# Patient Record
Sex: Male | Born: 1970
Health system: Southern US, Community
[De-identification: ages and names within clinical notes are randomized; demographics above are authoritative.]

## PROBLEM LIST (undated history)

## (undated) DIAGNOSIS — I639 Cerebral infarction, unspecified: Secondary | ICD-10-CM

## (undated) DIAGNOSIS — I10 Essential (primary) hypertension: Secondary | ICD-10-CM

## (undated) DIAGNOSIS — H269 Unspecified cataract: Secondary | ICD-10-CM

## (undated) DIAGNOSIS — I509 Heart failure, unspecified: Secondary | ICD-10-CM

## (undated) HISTORY — PX: CATARACT EXTRACTION: SUR2

## (undated) HISTORY — DX: Cerebral infarction, unspecified: I63.9

## (undated) HISTORY — DX: Unspecified cataract: H26.9

## (undated) HISTORY — PX: SKIN GRAFT: SHX250

---

## 2000-03-29 ENCOUNTER — Emergency Department (HOSPITAL_COMMUNITY): Admission: EM | Admit: 2000-03-29 | Discharge: 2000-03-29 | Payer: Self-pay | Admitting: Emergency Medicine

## 2004-10-22 ENCOUNTER — Emergency Department (HOSPITAL_COMMUNITY): Admission: EM | Admit: 2004-10-22 | Discharge: 2004-10-22 | Payer: Self-pay | Admitting: Emergency Medicine

## 2006-12-08 ENCOUNTER — Emergency Department (HOSPITAL_COMMUNITY): Admission: EM | Admit: 2006-12-08 | Discharge: 2006-12-08 | Payer: Self-pay | Admitting: *Deleted

## 2011-01-04 ENCOUNTER — Institutional Professional Consult (permissible substitution): Payer: Self-pay | Admitting: Cardiovascular Disease

## 2011-07-28 ENCOUNTER — Ambulatory Visit (INDEPENDENT_AMBULATORY_CARE_PROVIDER_SITE_OTHER): Payer: BC Managed Care – PPO | Admitting: Family Medicine

## 2011-07-28 VITALS — BP 142/86 | HR 85 | Temp 98.9°F | Resp 16 | Ht 74.5 in | Wt 274.0 lb

## 2011-07-28 DIAGNOSIS — I10 Essential (primary) hypertension: Secondary | ICD-10-CM

## 2011-07-28 LAB — BASIC METABOLIC PANEL
BUN: 11 mg/dL (ref 6–23)
Chloride: 97 mEq/L (ref 96–112)
Potassium: 4.6 mEq/L (ref 3.5–5.3)

## 2011-07-28 MED ORDER — LISINOPRIL 10 MG PO TABS: 10.0000 mg | ORAL_TABLET | Freq: Every day | ORAL | Status: DC

## 2011-07-28 NOTE — Progress Notes (Signed)
  Subjective:    Patient ID: Steven Cochran, male    DOB: 1970-12-30, 41 y.o.   MRN: [id number]  HPI 41 yo male with concern for high blood pressure.  Used to take lisinopril 10mg  daily.  Ran out about 2 months ago.  Insurance ran out.  Diagnosed with high blood pressure about 3 years ago.  Has been exercising and trying to lose weight, has lost about 35# over last year.    No other complaints.     Review of Systems Negative except as per HPI     Objective:   Physical Exam  Constitutional: He appears well-developed and well-nourished.  Cardiovascular: Normal rate, regular rhythm, normal heart sounds and intact distal pulses.   No murmur heard. Pulmonary/Chest: Effort normal and breath sounds normal.  Neurological: He is alert.  Skin: Skin is warm and dry.          Assessment & Plan:  HTN - restart lisinopril 10.  Check BMET

## 2011-07-29 MED ORDER — METFORMIN HCL 500 MG PO TABS: 500.0000 mg | ORAL_TABLET | Freq: Two times a day (BID) | ORAL | Status: DC

## 2011-07-29 NOTE — Progress Notes (Signed)
  Subjective:    Patient ID: Steven Cochran, male    DOB: 1970/05/23, 41 y.o.   MRN: [id number]  HPI    Review of Systems     Objective:   Physical Exam        Assessment & Plan:  Patient called about high sugar.  Is in [place] until early April.  Metformin sent to local [facility] there for patient to begin taking.  Advised to return back in as soon as he is back in town.

## 2011-07-29 NOTE — Progress Notes (Signed)
Addended by: Lamar Laundry on: 07/29/2011 01:44 PM   Modules accepted: Orders

## 2011-12-09 ENCOUNTER — Emergency Department (HOSPITAL_BASED_OUTPATIENT_CLINIC_OR_DEPARTMENT_OTHER)
Admission: EM | Admit: 2011-12-09 | Discharge: 2011-12-09 | Disposition: A | Discharge status: Home / Self Care | Payer: BC Managed Care – PPO | Attending: Emergency Medicine | Admitting: Emergency Medicine

## 2011-12-09 ENCOUNTER — Encounter (HOSPITAL_BASED_OUTPATIENT_CLINIC_OR_DEPARTMENT_OTHER): Payer: Self-pay | Admitting: Emergency Medicine

## 2011-12-09 DIAGNOSIS — I10 Essential (primary) hypertension: Secondary | ICD-10-CM

## 2011-12-09 DIAGNOSIS — E119 Type 2 diabetes mellitus without complications: Secondary | ICD-10-CM | POA: Insufficient documentation

## 2011-12-09 HISTORY — DX: Essential (primary) hypertension: I10

## 2011-12-09 MED ORDER — METOPROLOL TARTRATE 50 MG PO TABS: 25.0000 mg | ORAL_TABLET | Freq: Two times a day (BID) | ORAL | Status: DC

## 2011-12-09 NOTE — ED Notes (Addendum)
Pt states his BP has been getting higher in the last week.  Has been on BP medication for three months.  Pt denies any symptoms, no sob or chest pain.

## 2011-12-09 NOTE — ED Notes (Signed)
Pt brought in his own automatic BP machine which read 167/113. When checked manually it was 138/100.

## 2011-12-09 NOTE — ED Provider Notes (Signed)
History     CSN: [id number]  Arrival date & time 12/09/11  1221   First MD Initiated Contact with Patient 12/09/11 1244      Chief Complaint  Patient presents with  . Hypertension    (Consider location/radiation/quality/duration/timing/severity/associated sxs/prior treatment) HPI Comments: Has been checking his bp at home with his machine and is getting high readings.  No symptoms otherwise.  Patient is a 41 y.o. male presenting with hypertension. The history is provided by the patient.  Hypertension This is a chronic problem. The current episode started 2 days ago. The problem occurs constantly. The problem has been gradually worsening. Pertinent negatives include no chest pain, no abdominal pain and no shortness of breath. Nothing aggravates the symptoms. Nothing relieves the symptoms. He has tried nothing for the symptoms. The treatment provided no relief.    Past Medical History  Diagnosis Date  . Hypertension   . Diabetes mellitus     No past surgical history on file.  No family history on file.  History  Substance Use Topics  . Smoking status: Never Smoker   . Smokeless tobacco: Not on file  . Alcohol Use: No      Review of Systems  Respiratory: Negative for shortness of breath.   Cardiovascular: Negative for chest pain.  Gastrointestinal: Negative for abdominal pain.  All other systems reviewed and are negative.    Allergies  Review of patient's allergies indicates no known allergies.  Home Medications   Current Outpatient Rx  Name Route Sig Dispense Refill  . LISINOPRIL 10 MG PO TABS Oral Take 25 mg by mouth daily.    Marland Kitchen METFORMIN HCL 500 MG PO TABS Oral Take 1 tablet (500 mg total) by mouth 2 (two) times daily with a meal. 60 tablet 1    BP 134/96  Pulse 94  Temp 98.7 F (37.1 C) (Oral)  Resp 16  Ht 6\' 2"  (1.88 m)  Wt 287 lb (130.182 kg)  BMI 36.85 kg/m2  SpO2 100%  Physical Exam  Nursing note and vitals reviewed. Constitutional: He is  oriented to person, place, and time. He appears well-developed and well-nourished. No distress.  HENT:  Head: Normocephalic and atraumatic.  Neck: Normal range of motion. Neck supple.  Cardiovascular: Normal rate and regular rhythm.   No murmur heard. Pulmonary/Chest: Effort normal and breath sounds normal. No respiratory distress.  Abdominal: Soft. Bowel sounds are normal. He exhibits no distension.  Musculoskeletal: Normal range of motion. He exhibits no edema.  Neurological: He is alert and oriented to person, place, and time.  Skin: Skin is warm and dry. He is not diaphoretic.    ED Course  Procedures (including critical care time)  Labs Reviewed - No data to display No results found.   No diagnosis found.    MDM  The patient presents with elevated bp at home.  Readings here with our machine and his are consistent.  I will prescribe metoprolol if his bp's continue to run high.          Geoffery Lyons, MD 12/09/11 1336

## 2012-05-30 ENCOUNTER — Institutional Professional Consult (permissible substitution): Payer: Self-pay | Admitting: Cardiology

## 2012-06-05 ENCOUNTER — Encounter: Payer: Self-pay | Admitting: Cardiology

## 2012-06-17 ENCOUNTER — Institutional Professional Consult (permissible substitution): Payer: Self-pay | Admitting: Cardiology

## 2012-06-30 ENCOUNTER — Encounter: Payer: Self-pay | Admitting: Cardiovascular Disease

## 2012-06-30 ENCOUNTER — Ambulatory Visit (INDEPENDENT_AMBULATORY_CARE_PROVIDER_SITE_OTHER): Payer: BC Managed Care – PPO | Admitting: Cardiovascular Disease

## 2012-06-30 VITALS — BP 169/107 | HR 90 | Ht 74.5 in | Wt 283.1 lb

## 2012-06-30 DIAGNOSIS — I10 Essential (primary) hypertension: Secondary | ICD-10-CM | POA: Insufficient documentation

## 2012-06-30 DIAGNOSIS — R0989 Other specified symptoms and signs involving the circulatory and respiratory systems: Secondary | ICD-10-CM

## 2012-06-30 DIAGNOSIS — R0600 Dyspnea, unspecified: Secondary | ICD-10-CM

## 2012-06-30 MED ORDER — LISINOPRIL 10 MG PO TABS: 25.0000 mg | ORAL_TABLET | Freq: Every day | ORAL | Status: DC

## 2012-06-30 MED ORDER — HYDROCHLOROTHIAZIDE 25 MG PO TABS: 25.0000 mg | ORAL_TABLET | Freq: Every day | ORAL | Status: DC

## 2012-06-30 MED ORDER — METOPROLOL TARTRATE 50 MG PO TABS: 25.0000 mg | ORAL_TABLET | Freq: Two times a day (BID) | ORAL | Status: DC

## 2012-06-30 MED ORDER — POTASSIUM CHLORIDE CRYS ER 10 MEQ PO TBCR: 10.0000 meq | EXTENDED_RELEASE_TABLET | Freq: Every day | ORAL | Status: DC

## 2012-06-30 MED ORDER — CARVEDILOL 6.25 MG PO TABS: 6.2500 mg | ORAL_TABLET | Freq: Two times a day (BID) | ORAL | Status: DC

## 2012-06-30 NOTE — Assessment & Plan Note (Addendum)
Domonique  presents today for further evaluation of his hypertension and cardiac enlargement. He had a chest x-ray while working up in [place] about a year ago and was told he had an enlarged heart.  He's been concerned but has not been able to get his medications refilled because of lack of insurance. Now has insurance and wants to take better care of himself.  He ran out of his medications about a month ago.  I don't think that his current medication list is as good as we might be able to achieve. We'll start him on hydrochlorothiazide 25 mg a day as well as potassium chloride 10 mg a day. We'll also start him on carvedilol 6.25 mg twice a day instead of metoprolol. We'll gradually titrate up the carvedilol as tolerated/as needed. He has some concerns about ED - we may be able to use Bystlic instead of Coreg if this is a problem for his as we get to the higher doses.  We'll get an echocardiogram to make sure that he has normal left jugular size and function.  We'll see him back in one month for followup visit.

## 2012-06-30 NOTE — Progress Notes (Signed)
     Steven Cochran Date of Birth  1970-12-17       [facility]    [place] [address]  [address], [address] [place], [place]  [address]   [place], [place]  [address] [phone]     [phone]   Fax  [phone]    Fax [phone]  Problem List: 1. HTN 2. Mild cardiomegaly 3. Diabetes Mellitus  History of Present Illness:  Steven Cochran is a 42 yo with hx of HTN.  He was up and [place] working approximately one year ago. He presented to the emergency room with a prolonged episode of shortness breath and cough. He was told that he had an enlarged heart.  He lives here [place]. He sees Dr. Viann Shove for primary care. He has not taken his blood pressure medicines in about a month.  He has not had any chest pain or dyspnea.    He has a family hx of HTN ( mother) .  His mother also died of an aneurism.  Current Outpatient Prescriptions on File Prior to Visit  Medication Sig Dispense Refill  . lisinopril (PRINIVIL,ZESTRIL) 10 MG tablet Take 25 mg by mouth daily.      . metFORMIN (GLUCOPHAGE) 500 MG tablet Take 1 tablet (500 mg total) by mouth 2 (two) times daily with a meal.  60 tablet  1  . metoprolol (LOPRESSOR) 50 MG tablet Take 0.5 tablets (25 mg total) by mouth 2 (two) times daily.  60 tablet  1   No current facility-administered medications on file prior to visit.    No Known Allergies  Past Medical History  Diagnosis Date  . Hypertension   . Diabetes mellitus     No past surgical history on file.  History  Smoking status  . Never Smoker   Smokeless tobacco  . Not on file    History  Alcohol Use No    No family history on file.  Reviw of Systems:  Reviewed in the HPI.  All other systems are negative.  Physical Exam: Blood pressure 169/107, pulse 90, height 6' 2.5" (1.892 m), weight 283 lb 1.9 oz (128.422 kg). General: Well developed, well nourished, in no acute distress.  Head: Normocephalic, atraumatic, sclera  non-icteric, mucus membranes are moist,   Neck: Supple. Carotids are 2 + without bruits. No JVD   Lungs: Clear   Heart: RR, normal S1, S2.  Abdomen: Soft, non-tender, non-distended with normal bowel sounds.  Msk:  Strength and tone are normal   Extremities: No clubbing or cyanosis. No edema.  Distal pedal pulses are 2+ and equal    Neuro: CN II - XII intact.  Alert and oriented X 3.   Psych:  Normal   ECG: Feb. 24, 2014:  NSR at 86. NS ST/T abnormality   Assessment / Plan:

## 2012-06-30 NOTE — Patient Instructions (Addendum)
Your physician has requested that you have an echocardiogram. Echocardiography is a painless test that uses sound waves to create images of your heart. It provides your doctor with information about the size and shape of your heart and how well your heart's chambers and valves are working. This procedure takes approximately one hour. There are no restrictions for this procedure.  Your physician recommends that you schedule a follow-up appointment in: 1 MONTH WITH DR. Elease Hashimoto OR Norma Fredrickson, NP  Your physician has recommended you make the following change in your medication:   START HCTZ 25 MG MORNING START K-DUR 10 MG MORNING START COREG 6.25 TWICE A DAY (TWELVE HOURS APART)  REDUCE HIGH SODIUM FOODS LIKE CANNED SOUP, GRAVY, SAUCES, READY PREPARED FOODS LIKE FROZEN FOODS; LEAN CUISINE, LASAGNA. BACON, SAUSAGE, LUNCH MEAT, FAST FOODS.Marland Kitchen  DASH Diet The DASH diet stands for "Dietary Approaches to Stop Hypertension." It is a healthy eating plan that has been shown to reduce high blood pressure (hypertension) in as little as 14 days, while also possibly providing other significant health benefits. These other health benefits include reducing the risk of breast cancer after menopause and reducing the risk of type 2 diabetes, heart disease, colon cancer, and stroke. Health benefits also include weight loss and slowing kidney failure in patients with chronic kidney disease.  DIET GUIDELINES  Limit salt (sodium). Your diet should contain less than 1500 mg of sodium daily.  Limit refined or processed carbohydrates. Your diet should include mostly whole grains. Desserts and added sugars should be used sparingly.  Include small amounts of heart-healthy fats. These types of fats include nuts, oils, and tub margarine. Limit saturated and trans fats. These fats have been shown to be harmful in the body. CHOOSING FOODS  The following food groups are based on a 2000 calorie diet. See your Registered Dietitian  for individual calorie needs. Grains and Grain Products (6 to 8 servings daily)  Eat More Often: Whole-wheat bread, brown rice, whole-grain or wheat pasta, quinoa, popcorn without added fat or salt (air popped).  Eat Less Often: White bread, white pasta, white rice, cornbread. Vegetables (4 to 5 servings daily)  Eat More Often: Fresh, frozen, and canned vegetables. Vegetables may be raw, steamed, roasted, or grilled with a minimal amount of fat.  Eat Less Often/Avoid: Creamed or fried vegetables. Vegetables in a cheese sauce. Fruit (4 to 5 servings daily)  Eat More Often: All fresh, canned (in natural juice), or frozen fruits. Dried fruits without added sugar. One hundred percent fruit juice ( cup [237 mL] daily).  Eat Less Often: Dried fruits with added sugar. Canned fruit in light or heavy syrup. [employer], Fish, and Poultry (2 servings or less daily. One serving is 3 to 4 oz [85-114 g]).  Eat More Often: Ninety percent or leaner ground beef, tenderloin, sirloin. Round cuts of beef, chicken breast, [place] breast. All fish. Grill, bake, or broil your meat. Nothing should be fried.  Eat Less Often/Avoid: Fatty cuts of meat, [place], or chicken leg, thigh, or wing. Fried cuts of meat or fish. Dairy (2 to 3 servings)  Eat More Often: Low-fat or fat-free milk, low-fat plain or light yogurt, reduced-fat or part-skim cheese.  Eat Less Often/Avoid: Milk (whole, 2%).Whole milk yogurt. Full-fat cheeses. Nuts, Seeds, and Legumes (4 to 5 servings per week)  Eat More Often: All without added salt.  Eat Less Often/Avoid: Salted nuts and seeds, canned beans with added salt. Fats and Sweets (limited)  Eat More Often: Vegetable oils,  tub margarines without trans fats, sugar-free gelatin. Mayonnaise and salad dressings.  Eat Less Often/Avoid: Coconut oils, palm oils, butter, stick margarine, cream, half and half, cookies, candy, pie. FOR MORE INFORMATION The Dash Diet Eating Plan:  www.dashdiet.org Document Released: 04/12/2011 Document Revised: 07/16/2011 Document Reviewed: 04/12/2011 [facility] Patient Information 2013 [place], [place].

## 2012-07-01 ENCOUNTER — Other Ambulatory Visit: Payer: Self-pay

## 2012-07-11 ENCOUNTER — Other Ambulatory Visit (HOSPITAL_COMMUNITY): Payer: BC Managed Care – PPO

## 2012-07-15 ENCOUNTER — Other Ambulatory Visit (HOSPITAL_COMMUNITY): Payer: BC Managed Care – PPO

## 2012-07-15 ENCOUNTER — Ambulatory Visit (HOSPITAL_COMMUNITY): Payer: BC Managed Care – PPO | Attending: Cardiology

## 2012-07-15 DIAGNOSIS — R0989 Other specified symptoms and signs involving the circulatory and respiratory systems: Secondary | ICD-10-CM | POA: Insufficient documentation

## 2012-07-15 DIAGNOSIS — R0600 Dyspnea, unspecified: Secondary | ICD-10-CM

## 2012-07-15 DIAGNOSIS — I10 Essential (primary) hypertension: Secondary | ICD-10-CM | POA: Insufficient documentation

## 2012-07-15 DIAGNOSIS — R0609 Other forms of dyspnea: Secondary | ICD-10-CM | POA: Insufficient documentation

## 2012-07-15 DIAGNOSIS — I517 Cardiomegaly: Secondary | ICD-10-CM

## 2012-07-15 NOTE — Progress Notes (Signed)
Echocardiogram performed.  

## 2012-07-16 ENCOUNTER — Telehealth: Payer: Self-pay | Admitting: Cardiovascular Disease

## 2012-07-16 NOTE — Telephone Encounter (Signed)
Patient called echo results given.Advised to keep appointment with Norma Fredrickson NP 08/01/12.

## 2012-07-16 NOTE — Telephone Encounter (Signed)
New problem:  Test results.  

## 2012-08-01 ENCOUNTER — Ambulatory Visit: Payer: BC Managed Care – PPO | Admitting: Nurse Practitioner

## 2012-09-01 ENCOUNTER — Ambulatory Visit: Payer: BC Managed Care – PPO | Admitting: Nurse Practitioner

## 2012-09-15 ENCOUNTER — Ambulatory Visit: Payer: BC Managed Care – PPO | Admitting: Nurse Practitioner

## 2012-10-21 ENCOUNTER — Encounter: Payer: Self-pay | Admitting: Nurse Practitioner

## 2013-06-01 ENCOUNTER — Encounter (HOSPITAL_COMMUNITY): Payer: Self-pay | Admitting: Emergency Medicine

## 2013-06-01 ENCOUNTER — Emergency Department (HOSPITAL_COMMUNITY)
Admission: EM | Admit: 2013-06-01 | Discharge: 2013-06-01 | Disposition: A | Discharge status: Home / Self Care | Payer: BC Managed Care – PPO | Attending: Emergency Medicine | Admitting: Emergency Medicine

## 2013-06-01 DIAGNOSIS — Z760 Encounter for issue of repeat prescription: Secondary | ICD-10-CM

## 2013-06-01 DIAGNOSIS — Z79899 Other long term (current) drug therapy: Secondary | ICD-10-CM | POA: Insufficient documentation

## 2013-06-01 DIAGNOSIS — I10 Essential (primary) hypertension: Secondary | ICD-10-CM | POA: Insufficient documentation

## 2013-06-01 DIAGNOSIS — R739 Hyperglycemia, unspecified: Secondary | ICD-10-CM

## 2013-06-01 DIAGNOSIS — E119 Type 2 diabetes mellitus without complications: Secondary | ICD-10-CM | POA: Insufficient documentation

## 2013-06-01 LAB — URINALYSIS, ROUTINE W REFLEX MICROSCOPIC
Bilirubin Urine: NEGATIVE
Hgb urine dipstick: NEGATIVE
KETONES UR: NEGATIVE mg/dL
LEUKOCYTES UA: NEGATIVE
Nitrite: NEGATIVE
PH: 5 (ref 5.0–8.0)
Protein, ur: NEGATIVE mg/dL
SPECIFIC GRAVITY, URINE: 1.04 — AB (ref 1.005–1.030)
Urobilinogen, UA: 0.2 mg/dL (ref 0.0–1.0)

## 2013-06-01 LAB — COMPREHENSIVE METABOLIC PANEL
ALBUMIN: 4 g/dL (ref 3.5–5.2)
ALK PHOS: 83 U/L (ref 39–117)
ALT: 22 U/L (ref 0–53)
AST: 26 U/L (ref 0–37)
BUN: 15 mg/dL (ref 6–23)
CO2: 25 mEq/L (ref 19–32)
Calcium: 9.6 mg/dL (ref 8.4–10.5)
Chloride: 95 mEq/L — ABNORMAL LOW (ref 96–112)
Creatinine, Ser: 0.79 mg/dL (ref 0.50–1.35)
GFR calc Af Amer: >90 mL/min (ref >90)
GFR calc non Af Amer: >90 mL/min (ref >90)
Glucose, Bld: 405 mg/dL — ABNORMAL HIGH (ref 70–99)
POTASSIUM: 4.5 meq/L (ref 3.7–5.3)
Sodium: 134 mEq/L — ABNORMAL LOW (ref 137–147)
TOTAL PROTEIN: 7.7 g/dL (ref 6.0–8.3)
Total Bilirubin: <0.2 mg/dL — ABNORMAL LOW (ref 0.3–1.2)

## 2013-06-01 LAB — GLUCOSE, CAPILLARY
GLUCOSE-CAPILLARY: 388 mg/dL — AB (ref 70–99)
GLUCOSE-CAPILLARY: 344 mg/dL — AB (ref 70–99)
Glucose-Capillary: 287 mg/dL — ABNORMAL HIGH (ref 70–99)

## 2013-06-01 LAB — CBC
HEMATOCRIT: 38.1 % — AB (ref 39.0–52.0)
Hemoglobin: 12.7 g/dL — ABNORMAL LOW (ref 13.0–17.0)
MCH: 23.1 pg — ABNORMAL LOW (ref 26.0–34.0)
MCHC: 33.3 g/dL (ref 30.0–36.0)
MCV: 69.3 fL — ABNORMAL LOW (ref 78.0–100.0)
Platelets: 280 10*3/uL (ref 150–400)
RBC: 5.5 MIL/uL (ref 4.22–5.81)
RDW: 13.9 % (ref 11.5–15.5)
WBC: 7.4 10*3/uL (ref 4.0–10.5)

## 2013-06-01 MED ORDER — INSULIN ASPART 100 UNIT/ML ~~LOC~~ SOLN
10.0000 [IU] | Freq: Once | SUBCUTANEOUS | Status: AC
  Administered 2013-06-01: 10 [IU] via SUBCUTANEOUS
  Filled 2013-06-01: qty 1

## 2013-06-01 MED ORDER — SITAGLIPTIN-METFORMIN HCL 50-1000 MG PO TABS
1.0000 | ORAL_TABLET | Freq: Every day | ORAL | Status: DC
Start: 2013-06-01 — End: 2014-10-22

## 2013-06-01 MED ORDER — SODIUM CHLORIDE 0.9 % IV SOLN
Freq: Once | INTRAVENOUS | Status: AC
  Administered 2013-06-01: 05:00:00 via INTRAVENOUS

## 2013-06-01 MED ORDER — SODIUM CHLORIDE 0.9 % IV SOLN
Freq: Once | INTRAVENOUS | Status: AC
  Administered 2013-06-01: 03:00:00 via INTRAVENOUS

## 2013-06-01 NOTE — ED Provider Notes (Addendum)
CSN: [id number]     Arrival date & time 06/01/13  0127 History   First MD Initiated Contact with Patient 06/01/13 0500     Chief Complaint  Patient presents with  . Hyperglycemia   (Consider location/radiation/quality/duration/timing/severity/associated sxs/prior Treatment) HPI This is a 43 year old male with a history of type 2 diabetes who usually takes Janumet. He was out of town working for the past 4 months and ran out of his Janumet about 2 months ago. He had not been symptomatic recently. Specifically he has not had polyuria or polydipsia he checked his sugar yesterday evening and found to be 592. On arrival here he was found to be 388. He was given 2 L of normal saline IV and 10 units of subcutaneous insulin by my verbal order prior to my evaluation. His sugar has come down somewhat. He states when he is on his Janumet his sugar is usually well-controlled under 200. He continues to have no acute complaint.  Past Medical History  Diagnosis Date  . Hypertension   . Diabetes mellitus    History reviewed. No pertinent past surgical history. No family history on file. History  Substance Use Topics  . Smoking status: Never Smoker   . Smokeless tobacco: Not on file  . Alcohol Use: No    Review of Systems  All other systems reviewed and are negative.    Allergies  Review of patient's allergies indicates no known allergies.  Home Medications   Current Outpatient Rx  Name  Route  Sig  Dispense  Refill  . carvedilol (COREG) 6.25 MG tablet   Oral   Take 1 tablet (6.25 mg total) by mouth 2 (two) times daily.   60 tablet   5   . hydrochlorothiazide (HYDRODIURIL) 25 MG tablet   Oral   Take 1 tablet (25 mg total) by mouth daily.   30 tablet   5   . lisinopril (PRINIVIL,ZESTRIL) 10 MG tablet   Oral   Take 2.5 tablets (25 mg total) by mouth daily.   75 tablet   5   . metoprolol (LOPRESSOR) 50 MG tablet   Oral   Take 0.5 tablets (25 mg total) by mouth 2 (two) times  daily.   30 tablet   6   . naproxen sodium (ANAPROX) 220 MG tablet   Oral   Take 440 mg by mouth daily as needed (for pain).         Marland Kitchen OVER THE COUNTER MEDICATION      Pt gets a multivitamin pack from [facility] for diabetics (Diabetes multipak)         . potassium chloride (K-DUR,KLOR-CON) 10 MEQ tablet   Oral   Take 1 tablet (10 mEq total) by mouth daily.   30 tablet   5    BP 185/122  Pulse 102  Temp(Src) 98 F (36.7 C) (Oral)  Resp 16  Ht 6' 2.5" (1.892 m)  Wt 285 lb 7 oz (129.474 kg)  BMI 36.17 kg/m2  SpO2 96%  Physical Exam General: Well-developed, well-nourished male in no acute distress; appearance consistent with age of record HENT: normocephalic; atraumatic Eyes: pupils equal, round and reactive to light; extraocular muscles intact Neck: supple Heart: regular rate and rhythm; no murmurs, rubs or gallops Lungs: clear to auscultation bilaterally Abdomen: soft; nondistended; nontender; no masses or hepatosplenomegaly; bowel sounds present Extremities: No deformity; full range of motion; pulses normal Neurologic: Awake, alert and oriented; motor function intact in all extremities and symmetric; no facial droop Skin:  Warm and dry Psychiatric: Normal mood and affect    ED Course  Procedures (including critical care time)    MDM   Nursing notes and vitals signs, including pulse oximetry, reviewed.  Summary of this visit's results, reviewed by myself:  Labs:  Results for orders placed during the hospital encounter of 06/01/13 (from the past 24 hour(s))  CBC     Status: Abnormal   Collection Time    06/01/13  1:46 AM      Result Value Range   WBC 7.4  4.0 - 10.5 K/uL   RBC 5.50  4.22 - 5.81 MIL/uL   Hemoglobin 12.7 (*) 13.0 - 17.0 g/dL   HCT 45.4 (*) 09.8 - 11.9 %   MCV 69.3 (*) 78.0 - 100.0 fL   MCH 23.1 (*) 26.0 - 34.0 pg   MCHC 33.3  30.0 - 36.0 g/dL   RDW 14.7  82.9 - 56.2 %   Platelets 280  150 - 400 K/uL  COMPREHENSIVE METABOLIC PANEL      Status: Abnormal   Collection Time    06/01/13  1:46 AM      Result Value Range   Sodium 134 (*) 137 - 147 mEq/L   Potassium 4.5  3.7 - 5.3 mEq/L   Chloride 95 (*) 96 - 112 mEq/L   CO2 25  19 - 32 mEq/L   Glucose, Bld 405 (*) 70 - 99 mg/dL   BUN 15  6 - 23 mg/dL   Creatinine, Ser 1.30  0.50 - 1.35 mg/dL   Calcium 9.6  8.4 - 86.5 mg/dL   Total Protein 7.7  6.0 - 8.3 g/dL   Albumin 4.0  3.5 - 5.2 g/dL   AST 26  0 - 37 U/L   ALT 22  0 - 53 U/L   Alkaline Phosphatase 83  39 - 117 U/L   Total Bilirubin <0.2 (*) 0.3 - 1.2 mg/dL   GFR calc non Af Amer >90  >90 mL/min   GFR calc Af Amer >90  >90 mL/min  GLUCOSE, CAPILLARY     Status: Abnormal   Collection Time    06/01/13  1:48 AM      Result Value Range   Glucose-Capillary 388 (*) 70 - 99 mg/dL   Comment 1 Documented in Chart     Comment 2 Notify RN    URINALYSIS, ROUTINE W REFLEX MICROSCOPIC     Status: Abnormal   Collection Time    06/01/13  3:06 AM      Result Value Range   Color, Urine YELLOW  YELLOW   APPearance CLEAR  CLEAR   Specific Gravity, Urine 1.040 (*) 1.005 - 1.030   pH 5.0  5.0 - 8.0   Glucose, UA >1000 (*) NEGATIVE mg/dL   Hgb urine dipstick NEGATIVE  NEGATIVE   Bilirubin Urine NEGATIVE  NEGATIVE   Ketones, ur NEGATIVE  NEGATIVE mg/dL   Protein, ur NEGATIVE  NEGATIVE mg/dL   Urobilinogen, UA 0.2  0.0 - 1.0 mg/dL   Nitrite NEGATIVE  NEGATIVE   Leukocytes, UA NEGATIVE  NEGATIVE  GLUCOSE, CAPILLARY     Status: Abnormal   Collection Time    06/01/13  4:23 AM      Result Value Range   Glucose-Capillary 344 (*) 70 - 99 mg/dL   We will refill his Janumet and he will followup with his primary care physician. The patient is insistent that he was only on Janumet once daily despite its labeling recommending twice a  day.      Hanley SeamenJohn L Kennesha Brewbaker, MD 06/01/13 [id number]  Hanley SeamenJohn L Ninoska Goswick, MD 06/01/13 [phone]

## 2013-06-01 NOTE — ED Notes (Signed)
Pt states he has been out of state working for 4 months and has not had his glucose meter or diabetic medications.  States he returned yesterday and checked his blood sugar at home tonight and it was 592.  Denies any symptoms.  BP also elevated.  Pt states he has been taking BP medication but was really scared when he realized CBG was elevated so much tonight.

## 2013-06-01 NOTE — Discharge Instructions (Signed)

## 2014-09-21 ENCOUNTER — Ambulatory Visit: Payer: BLUE CROSS/BLUE SHIELD

## 2014-09-28 ENCOUNTER — Ambulatory Visit: Payer: BLUE CROSS/BLUE SHIELD

## 2014-10-05 ENCOUNTER — Ambulatory Visit: Payer: BLUE CROSS/BLUE SHIELD

## 2014-10-22 ENCOUNTER — Ambulatory Visit (INDEPENDENT_AMBULATORY_CARE_PROVIDER_SITE_OTHER): Payer: Self-pay | Admitting: Family Medicine

## 2014-10-22 VITALS — BP 158/102 | HR 107 | Temp 98.9°F | Resp 18 | Ht 73.5 in | Wt 266.0 lb

## 2014-10-22 DIAGNOSIS — Z0000 Encounter for general adult medical examination without abnormal findings: Secondary | ICD-10-CM

## 2014-10-22 DIAGNOSIS — Z021 Encounter for pre-employment examination: Secondary | ICD-10-CM

## 2014-10-22 NOTE — Progress Notes (Signed)
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 He is being treated for early diabetes and hypertension. He's having no symptoms at the present time. His last A1c was 5.8.   Objective: Please see form Physical exam is normal Patient brings in a ophthalmological evaluation showing he has except for vision of 20/30 in one eye and 20/40 in the other  Blood pressure recheck 138/88  Assessment: Passes for one year  This chart was scribed in my presence and reviewed by me personally.    ICD-9-CM ICD-10-CM   1. Physical exam, annual V70.0 Z00.00      Signed, Elvina Sidle, MD

## 2015-09-20 ENCOUNTER — Encounter (HOSPITAL_BASED_OUTPATIENT_CLINIC_OR_DEPARTMENT_OTHER): Payer: Self-pay

## 2015-09-20 ENCOUNTER — Emergency Department (HOSPITAL_BASED_OUTPATIENT_CLINIC_OR_DEPARTMENT_OTHER)
Admission: EM | Admit: 2015-09-20 | Discharge: 2015-09-20 | Disposition: A | Discharge status: Home / Self Care | Payer: BLUE CROSS/BLUE SHIELD | Attending: Emergency Medicine | Admitting: Emergency Medicine

## 2015-09-20 DIAGNOSIS — E1165 Type 2 diabetes mellitus with hyperglycemia: Secondary | ICD-10-CM | POA: Insufficient documentation

## 2015-09-20 DIAGNOSIS — R739 Hyperglycemia, unspecified: Secondary | ICD-10-CM

## 2015-09-20 DIAGNOSIS — I10 Essential (primary) hypertension: Secondary | ICD-10-CM | POA: Insufficient documentation

## 2015-09-20 LAB — CBC WITH DIFFERENTIAL/PLATELET
BASOS ABS: 0 10*3/uL (ref 0.0–0.1)
Basophils Relative: 0 %
EOS ABS: 0.1 10*3/uL (ref 0.0–0.7)
Eosinophils Relative: 1 %
HEMATOCRIT: 35.3 % — AB (ref 39.0–52.0)
HEMOGLOBIN: 11.3 g/dL — AB (ref 13.0–17.0)
LYMPHS PCT: 53 %
Lymphs Abs: 3.4 10*3/uL (ref 0.7–4.0)
MCH: 22.1 pg — ABNORMAL LOW (ref 26.0–34.0)
MCHC: 32 g/dL (ref 30.0–36.0)
MCV: 69.1 fL — ABNORMAL LOW (ref 78.0–100.0)
MONOS PCT: 7 %
Monocytes Absolute: 0.5 10*3/uL (ref 0.1–1.0)
NEUTROS ABS: 2.6 10*3/uL (ref 1.7–7.7)
NEUTROS PCT: 39 %
Platelets: 237 10*3/uL (ref 150–400)
RBC: 5.11 MIL/uL (ref 4.22–5.81)
RDW: 14.7 % (ref 11.5–15.5)
WBC: 6.6 10*3/uL (ref 4.0–10.5)

## 2015-09-20 LAB — BASIC METABOLIC PANEL
ANION GAP: 7 (ref 5–15)
BUN: 16 mg/dL (ref 6–20)
CALCIUM: 9 mg/dL (ref 8.9–10.3)
CHLORIDE: 100 mmol/L — AB (ref 101–111)
CO2: 26 mmol/L (ref 22–32)
CREATININE: 1.02 mg/dL (ref 0.61–1.24)
GFR calc non Af Amer: >60 mL/min (ref >60)
Glucose, Bld: 450 mg/dL — ABNORMAL HIGH (ref 65–99)
Potassium: 4.6 mmol/L (ref 3.5–5.1)
SODIUM: 133 mmol/L — AB (ref 135–145)

## 2015-09-20 MED ORDER — AMLODIPINE BESYLATE 5 MG PO TABS: 5.0000 mg | ORAL_TABLET | Freq: Every day | ORAL | Status: DC

## 2015-09-20 MED ORDER — LISINOPRIL 10 MG PO TABS
10.0000 mg | ORAL_TABLET | Freq: Once | ORAL | Status: AC
Start: 2015-09-20 — End: 2015-09-20
  Administered 2015-09-20: 10 mg via ORAL
  Filled 2015-09-20: qty 1

## 2015-09-20 MED ORDER — METFORMIN HCL 500 MG PO TABS
1000.0000 mg | ORAL_TABLET | Freq: Once | ORAL | Status: AC
Start: 2015-09-20 — End: 2015-09-20
  Administered 2015-09-20: 1000 mg via ORAL
  Filled 2015-09-20: qty 2

## 2015-09-20 MED ORDER — AMLODIPINE BESYLATE 5 MG PO TABS
5.0000 mg | ORAL_TABLET | Freq: Once | ORAL | Status: AC
  Administered 2015-09-20: 5 mg via ORAL
  Filled 2015-09-20: qty 1

## 2015-09-20 MED ORDER — LISINOPRIL 10 MG PO TABS: 10.0000 mg | ORAL_TABLET | Freq: Every day | ORAL | Status: DC

## 2015-09-20 MED ORDER — METFORMIN HCL 1000 MG PO TABS: 1000.0000 mg | ORAL_TABLET | Freq: Two times a day (BID) | ORAL | Status: DC

## 2015-09-20 NOTE — ED Provider Notes (Addendum)
CSN: [id number]     Arrival date & time 09/20/15  1743 History  By signing my name below, I, Freida Busman, attest that this documentation has been prepared under the direction and in the presence of Gwyneth Sprout, MD . Electronically Signed: Freida Busman, Scribe. 09/20/2015. 6:34 PM.   Chief Complaint  Patient presents with  . Edema    The history is provided by the patient. No language interpreter was used.   HPI Comments:  Steven Cochran is a 45 y.o. male with a history of HTN and DM, who presents to the Emergency Department complaining of moderate swelling to his bilateral ankles x a few weeks, L>R. Pt reports associated intermittent aching pain to his feet at night. Pt notes he has been out of his meds for ~ 8 months due to lack of insurance. He denies SOB, abdominal pain, nausea, vomitng. Pt notes frequent urination and states his blood sugar has been ~ 300 but no higher than that. 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 No alleviating factors noted.  Past Medical History  Diagnosis Date  . Hypertension   . Diabetes mellitus    History reviewed. No pertinent past surgical history. No family history on file. Social History  Substance Use Topics  . Smoking status: Never Smoker   . Smokeless tobacco: None  . Alcohol Use: No    Review of Systems  Respiratory: Negative for shortness of breath.   Gastrointestinal: Negative for nausea, vomiting and abdominal pain.  Endocrine: Positive for polyuria.  Musculoskeletal: Positive for myalgias and joint swelling (BL ankles).       + Bilateral foot pain  All other systems reviewed and are negative.  Allergies  Review of patient's allergies indicates no known allergies.  Home Medications   Prior to Admission medications   Not on File   BP 164/110 mmHg  Pulse 102  Temp(Src) 98.6 F (37 C) (Oral)  Resp 18  Ht  (1.88 m)  Wt 271 lb 6.4 oz (123.106 kg)  BMI 34.83 kg/m2  SpO2  100% Physical Exam  Constitutional: He is oriented to person, place, and time. He appears well-developed and well-nourished.  HENT:  Head: Normocephalic and atraumatic.  Eyes: EOM are normal.  Neck: Normal range of motion.  Cardiovascular: Normal rate, regular rhythm, normal heart sounds and intact distal pulses.   Pulmonary/Chest: Effort normal and breath sounds normal. No respiratory distress.  Abdominal: Soft. He exhibits no distension. There is no tenderness.  Musculoskeletal: Normal range of motion. He exhibits edema.  Mild non-pitting edema bilateral ankles; no calf tenderness or swelling   Neurological: He is alert and oriented to person, place, and time.  Skin: Skin is warm and dry.  Psychiatric: He has a normal mood and affect. Judgment normal.  Nursing note and vitals reviewed.   ED Course  Procedures  DIAGNOSTIC STUDIES:  Oxygen Saturation is 100% on RA, normal by my interpretation.    COORDINATION OF CARE:  6:30 PM Discussed treatment plan with pt at bedside and pt agreed to plan.  Labs Review Labs Reviewed  CBC WITH DIFFERENTIAL/PLATELET - Abnormal; Notable for the following:    Hemoglobin 11.3 (*)    HCT 35.3 (*)    MCV 69.1 (*)    MCH 22.1 (*)    All other components within normal limits  BASIC METABOLIC PANEL - Abnormal; Notable for the following:    Sodium 133 (*)    Chloride  100 (*)    Glucose, Bld 450 (*)    All other components within normal limits    Imaging Review No results found. I have personally reviewed and evaluated these images and lab results as part of my medical decision-making.  MDM   Final diagnoses:  Essential hypertension  Hyperglycemia    Patient is a 45 year old male with a history of hypertension and diabetes who's been off meds for approximately 8 months and for the last several weeks as been having nonpitting edema in the lower extremities. 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 He has no pet chest pain or shortness of breath concerning for a PE. Labs today without significant evidence of renal disease and blood sugar today of 400 but again has been off meds for about 8 months.  We'll start patient back on his amlodipine, lisinopril and metformin. He was given the number for health and wellness.  I personally performed the services described in this documentation, which was scribed in my presence.  The recorded information has been reviewed and considered.   Gwyneth SproutWhitney Helvi Royals, MD 09/20/15 Ernestina [id number]  Gwyneth SproutWhitney Rashanna Christiana, MD 09/20/15 [phone]

## 2015-09-20 NOTE — Discharge Instructions (Signed)
DASH Eating Plan  DASH stands for "Dietary Approaches to Stop Hypertension." The DASH eating plan is a healthy eating plan that has been shown to reduce high blood pressure (hypertension). Additional health benefits may include reducing the risk of type 2 diabetes mellitus, heart disease, and stroke. The DASH eating plan may also help with weight loss.  WHAT DO I NEED TO KNOW ABOUT THE DASH EATING PLAN?  For the DASH eating plan, you will follow these general guidelines:  · Choose foods with a percent daily value for sodium of less than 5% (as listed on the food label).  · Use salt-free seasonings or herbs instead of table salt or sea salt.  · Check with your health care provider or pharmacist before using salt substitutes.  · Eat lower-sodium products, often labeled as "lower sodium" or "no salt added."  · Eat fresh foods.  · Eat more vegetables, fruits, and low-fat dairy products.  · Choose whole grains. Look for the word "whole" as the first word in the ingredient list.  · Choose fish and skinless chicken or turkey more often than red meat. Limit fish, poultry, and meat to 6 oz (170 g) each day.  · Limit sweets, desserts, sugars, and sugary drinks.  · Choose heart-healthy fats.  · Limit cheese to 1 oz (28 g) per day.  · Eat more home-cooked food and less restaurant, buffet, and fast food.  · Limit fried foods.  · Cook foods using methods other than frying.  · Limit canned vegetables. If you do use them, rinse them well to decrease the sodium.  · When eating at a restaurant, ask that your food be prepared with less salt, or no salt if possible.  WHAT FOODS CAN I EAT?  Seek help from a dietitian for individual calorie needs.  Grains  Whole grain or whole wheat bread. Brown rice. Whole grain or whole wheat pasta. Quinoa, bulgur, and whole grain cereals. Low-sodium cereals. Corn or whole wheat flour tortillas. Whole grain cornbread. Whole grain crackers. Low-sodium crackers.  Vegetables  Fresh or frozen vegetables  (raw, steamed, roasted, or grilled). Low-sodium or reduced-sodium tomato and vegetable juices. Low-sodium or reduced-sodium tomato sauce and paste. Low-sodium or reduced-sodium canned vegetables.   Fruits  All fresh, canned (in natural juice), or frozen fruits.  Meat and Other Protein Products  Ground beef (85% or leaner), grass-fed beef, or beef trimmed of fat. Skinless chicken or turkey. Ground chicken or turkey. Pork trimmed of fat. All fish and seafood. Eggs. Dried beans, peas, or lentils. Unsalted nuts and seeds. Unsalted canned beans.  Dairy  Low-fat dairy products, such as skim or 1% milk, 2% or reduced-fat cheeses, low-fat ricotta or cottage cheese, or plain low-fat yogurt. Low-sodium or reduced-sodium cheeses.  Fats and Oils  Tub margarines without trans fats. Light or reduced-fat mayonnaise and salad dressings (reduced sodium). Avocado. Safflower, olive, or canola oils. Natural peanut or almond butter.  Other  Unsalted popcorn and pretzels.  The items listed above may not be a complete list of recommended foods or beverages. Contact your dietitian for more options.  WHAT FOODS ARE NOT RECOMMENDED?  Grains  White bread. White pasta. White rice. Refined cornbread. Bagels and croissants. Crackers that contain trans fat.  Vegetables  Creamed or fried vegetables. Vegetables in a cheese sauce. Regular canned vegetables. Regular canned tomato sauce and paste. Regular tomato and vegetable juices.  Fruits  Dried fruits. Canned fruit in light or heavy syrup. Fruit juice.  Meat and Other Protein   Products  Fatty cuts of meat. Ribs, chicken wings, bacon, sausage, bologna, salami, chitterlings, fatback, hot dogs, bratwurst, and packaged luncheon meats. Salted nuts and seeds. Canned beans with salt.  Dairy  Whole or 2% milk, cream, half-and-half, and cream cheese. Whole-fat or sweetened yogurt. Full-fat cheeses or blue cheese. Nondairy creamers and whipped toppings. Processed cheese, cheese spreads, or cheese  curds.  Condiments  Onion and garlic salt, seasoned salt, table salt, and sea salt. Canned and packaged gravies. Worcestershire sauce. Tartar sauce. Barbecue sauce. Teriyaki sauce. Soy sauce, including reduced sodium. Steak sauce. Fish sauce. Oyster sauce. Cocktail sauce. Horseradish. Ketchup and mustard. Meat flavorings and tenderizers. Bouillon cubes. Hot sauce. Tabasco sauce. Marinades. Taco seasonings. Relishes.  Fats and Oils  Butter, stick margarine, lard, shortening, ghee, and bacon fat. Coconut, palm kernel, or palm oils. Regular salad dressings.  Other  Pickles and olives. Salted popcorn and pretzels.  The items listed above may not be a complete list of foods and beverages to avoid. Contact your dietitian for more information.  WHERE CAN I FIND MORE INFORMATION?  National Heart, Lung, and [employer] Institute: www.nhlbi.nih.gov/health/health-topics/topics/dash/     This information is not intended to replace advice given to you by your health care provider. Make sure you discuss any questions you have with your health care provider.     Document Released: 04/12/2011 Document Revised: 05/14/2014 Document Reviewed: 02/25/2013  Elsevier Interactive Patient Education ©2016 Elsevier Inc.

## 2015-09-20 NOTE — ED Notes (Signed)
C/o bilat ankle swelling and feeling "like water is running down the back of my head" x 2 weeks-NAD-steady gait

## 2015-09-20 NOTE — ED Notes (Signed)
MD at bedside. 

## 2016-07-03 ENCOUNTER — Encounter (HOSPITAL_BASED_OUTPATIENT_CLINIC_OR_DEPARTMENT_OTHER): Payer: Self-pay | Admitting: *Deleted

## 2016-07-03 ENCOUNTER — Emergency Department (HOSPITAL_BASED_OUTPATIENT_CLINIC_OR_DEPARTMENT_OTHER)
Admission: EM | Admit: 2016-07-03 | Discharge: 2016-07-04 | Disposition: A | Discharge status: Home / Self Care | Payer: Self-pay | Attending: Emergency Medicine | Admitting: Emergency Medicine

## 2016-07-03 ENCOUNTER — Emergency Department (HOSPITAL_BASED_OUTPATIENT_CLINIC_OR_DEPARTMENT_OTHER): Payer: Self-pay

## 2016-07-03 DIAGNOSIS — R739 Hyperglycemia, unspecified: Secondary | ICD-10-CM

## 2016-07-03 DIAGNOSIS — L03116 Cellulitis of left lower limb: Secondary | ICD-10-CM | POA: Insufficient documentation

## 2016-07-03 DIAGNOSIS — I10 Essential (primary) hypertension: Secondary | ICD-10-CM | POA: Insufficient documentation

## 2016-07-03 DIAGNOSIS — Z7984 Long term (current) use of oral hypoglycemic drugs: Secondary | ICD-10-CM | POA: Insufficient documentation

## 2016-07-03 DIAGNOSIS — Z79899 Other long term (current) drug therapy: Secondary | ICD-10-CM | POA: Insufficient documentation

## 2016-07-03 DIAGNOSIS — E1165 Type 2 diabetes mellitus with hyperglycemia: Secondary | ICD-10-CM | POA: Insufficient documentation

## 2016-07-03 LAB — BASIC METABOLIC PANEL
Anion gap: 8 (ref 5–15)
BUN: 19 mg/dL (ref 6–20)
CHLORIDE: 97 mmol/L — AB (ref 101–111)
CO2: 27 mmol/L (ref 22–32)
CREATININE: 0.94 mg/dL (ref 0.61–1.24)
Calcium: 9.2 mg/dL (ref 8.9–10.3)
GFR calc Af Amer: >60 mL/min (ref >60)
GFR calc non Af Amer: >60 mL/min (ref >60)
GLUCOSE: 335 mg/dL — AB (ref 65–99)
Potassium: 4.1 mmol/L (ref 3.5–5.1)
Sodium: 132 mmol/L — ABNORMAL LOW (ref 135–145)

## 2016-07-03 LAB — CBC WITH DIFFERENTIAL/PLATELET
Basophils Absolute: 0 10*3/uL (ref 0.0–0.1)
Basophils Relative: 0 %
Eosinophils Absolute: 0.1 10*3/uL (ref 0.0–0.7)
Eosinophils Relative: 2 %
HCT: 37.5 % — ABNORMAL LOW (ref 39.0–52.0)
HEMOGLOBIN: 12.4 g/dL — AB (ref 13.0–17.0)
LYMPHS ABS: 3.8 10*3/uL (ref 0.7–4.0)
LYMPHS PCT: 56 %
MCH: 22.7 pg — ABNORMAL LOW (ref 26.0–34.0)
MCHC: 33.1 g/dL (ref 30.0–36.0)
MCV: 68.7 fL — ABNORMAL LOW (ref 78.0–100.0)
Monocytes Absolute: 0.5 10*3/uL (ref 0.1–1.0)
Monocytes Relative: 7 %
NEUTROS ABS: 2.3 10*3/uL (ref 1.7–7.7)
Neutrophils Relative %: 35 %
Platelets: 320 10*3/uL (ref 150–400)
RBC: 5.46 MIL/uL (ref 4.22–5.81)
RDW: 14.1 % (ref 11.5–15.5)
WBC: 6.7 10*3/uL (ref 4.0–10.5)

## 2016-07-03 LAB — CBG MONITORING, ED
Glucose-Capillary: 274 mg/dL — ABNORMAL HIGH (ref 65–99)
Glucose-Capillary: 339 mg/dL — ABNORMAL HIGH (ref 65–99)

## 2016-07-03 MED ORDER — SODIUM CHLORIDE 0.9 % IV BOLUS (SEPSIS)
1000.0000 mL | Freq: Once | INTRAVENOUS | Status: AC
  Administered 2016-07-03: 1000 mL via INTRAVENOUS

## 2016-07-03 MED ORDER — INSULIN ASPART 100 UNIT/ML ~~LOC~~ SOLN
6.0000 [IU] | Freq: Once | SUBCUTANEOUS | Status: AC
  Administered 2016-07-03: 6 [IU] via SUBCUTANEOUS
  Filled 2016-07-03: qty 1

## 2016-07-03 NOTE — ED Notes (Signed)
Patient transported to X-ray 

## 2016-07-03 NOTE — ED Triage Notes (Addendum)
Left leg has been swelling x 3 days. He slipped off a ladder and scraped his leg prior to the swelling. Left lower leg is swollen, red, hot to touch and painful. Abrasions healing. Hx of diabetes. He ran out of his meds a month ago and does not have the money to see a MD.

## 2016-07-03 NOTE — ED Provider Notes (Signed)
[facility]   CSN: [id number] Arrival date & time: 07/03/16  2108    By signing my name below, I, Steven Cochran, attest that this documentation has been prepared under the direction and in the presence of Melburn HakeNicole Jalen Oberry, [place]. Electronically Signed: Talbert NanPaul Cochran, Scribe. 07/03/16. 10:23 PM.    History   Chief Complaint Chief Complaint  Patient presents with  . Leg Pain    HPI Steven Cochran is a 46 y.o. male with h/o Dm, HTN who presents to the Emergency Department complaining of gradual onset, moderate left lower pain that started 4 days ago. Pt scraped his legs s/p slipping on a ladder 4-5 days. Denies head injury or LOC. Pt has associated redness and swelling around scrape noted on left lower leg. Denies fever, drainage, numbness, weakness, calf swelling/pain. Pt notes he ran out of DM medicine and does not have a prescription at the moment. Reports he currently does not have a PCP. Denies Cough, shortness of breath, chest pain, abdominal pain, nausea, vomiting, diarrhea, urinary symptoms, rash.   The history is provided by the patient. No language interpreter was used.    Past Medical History:  Diagnosis Date  . Diabetes mellitus   . Hypertension     Patient Active Problem List   Diagnosis Date Noted  . HTN (hypertension) 06/30/2012    History reviewed. No pertinent surgical history.     Home Medications    Prior to Admission medications   Medication Sig Start Date End Date Taking? Authorizing Provider  amLODipine (NORVASC) 5 MG tablet Take 1 tablet (5 mg total) by mouth daily. 09/20/15   Gwyneth SproutWhitney Plunkett, MD  cephALEXin (KEFLEX) 500 MG capsule Take 1 capsule (500 mg total) by mouth 2 (two) times daily. 07/04/16 07/11/16  Barrett HenleNicole Elizabeth Divon Krabill, PA-C  lisinopril (PRINIVIL,ZESTRIL) 10 MG tablet Take 1 tablet (10 mg total) by mouth daily. 09/20/15   Gwyneth SproutWhitney Plunkett, MD  metFORMIN (GLUCOPHAGE) 1000 MG tablet Take 1 tablet (1,000 mg total) by mouth 2  (two) times daily with a meal. 09/20/15   Gwyneth SproutWhitney Plunkett, MD  sulfamethoxazole-trimethoprim (BACTRIM DS,SEPTRA DS) 800-160 MG tablet Take 1 tablet by mouth 2 (two) times daily. 07/04/16 07/11/16  Barrett HenleNicole Elizabeth Lekita Kerekes, PA-C    Family History No family history on file.  Social History Social History  Substance Use Topics  . Smoking status: Never Smoker  . Smokeless tobacco: Never Used  . Alcohol use No     Allergies   Patient has no known allergies.   Review of Systems Review of Systems  Musculoskeletal: Positive for arthralgias and myalgias.  Skin: Positive for color change and wound.  All other systems reviewed and are negative.    Physical Exam Updated Vital Signs BP 158/92   Pulse 78   Temp 98.3 F (36.8 C) (Oral)   Resp 16   Ht 6\' 2"  (1.88 m)   Wt 127 kg   SpO2 95%   BMI 35.95 kg/m   Physical Exam  Constitutional: He is oriented to person, place, and time. He appears well-developed and well-nourished.  HENT:  Head: Normocephalic and atraumatic.  Eyes: Conjunctivae and EOM are normal. Right eye exhibits no discharge. Left eye exhibits no discharge. No scleral icterus.  Neck: Normal range of motion. Neck supple.  Cardiovascular: Normal rate, regular rhythm, normal heart sounds and intact distal pulses.   Pulmonary/Chest: Effort normal and breath sounds normal. No respiratory distress. He has no wheezes. He has no rales. He exhibits no tenderness.  Abdominal: Soft. Bowel sounds are normal. He exhibits no distension and no mass. There is no tenderness. There is no rebound and no guarding. No hernia.  Musculoskeletal: Normal range of motion. He exhibits tenderness. He exhibits no edema or deformity.  Full ROM of left knee, ankle, and foot with 5/5 strength. Mild swelling present to left ankle with mild tenderness to medial malleolus. No calf swelling or tenderness 2+ dp pulse, sensation grossly intact. Cap refill <2.   Neurological: He is alert and oriented to  person, place, and time.  Skin: Skin is warm and dry. There is erythema.  Multiple healing abrasions noted to bilateral shins. 3 cm abrasion present to left distal shin with mild associated surround swelling and erythema. No drainage, active bleeding.  Nursing note and vitals reviewed.    ED Treatments / Results   DIAGNOSTIC STUDIES: Oxygen Saturation is 98% on room air, normal by my interpretation.    COORDINATION OF CARE: 10:00 PM Discussed treatment plan with pt at bedside and pt agreed to plan, which includes XR of ankle, and abx for infection.   Labs (all labs ordered are listed, but only abnormal results are displayed) Labs Reviewed  CBC WITH DIFFERENTIAL/PLATELET - Abnormal; Notable for the following:       Result Value   Hemoglobin 12.4 (*)    HCT 37.5 (*)    MCV 68.7 (*)    MCH 22.7 (*)    All other components within normal limits  BASIC METABOLIC PANEL - Abnormal; Notable for the following:    Sodium 132 (*)    Chloride 97 (*)    Glucose, Bld 335 (*)    All other components within normal limits  CBG MONITORING, ED - Abnormal; Notable for the following:    Glucose-Capillary 339 (*)    All other components within normal limits  CBG MONITORING, ED - Abnormal; Notable for the following:    Glucose-Capillary 274 (*)    All other components within normal limits    EKG  EKG Interpretation None       Radiology Dg Ankle Complete Left  Result Date: 07/03/2016 CLINICAL DATA:  Left ankle pain and swelling with erythema. History of fall 1 week ago. EXAM: LEFT ANKLE COMPLETE - 3+ VIEW COMPARISON:  None. FINDINGS: Diffuse soft tissue swelling of the visualized leg pain and ankle without acute displaced fracture or malalignment. Prominent plantar and dorsal calcaneal enthesophytes. No significant joint effusion. No underlying bone destruction. Old corticated ossification projects over the medial malleolus likely related to old remote trauma. IMPRESSION: 1. Diffuse soft  tissue swelling of visualized leg and ankle suspicious for cellulitis. 2. Plantar and dorsal calcaneal enthesophytes. 3. No acute fracture nor bone destruction. Electronically Signed   By: Tollie Eth M.D.   On: 07/03/2016 23:14    Procedures Procedures (including critical care time)  Medications Ordered in ED Medications  sodium chloride 0.9 % bolus 1,000 mL (0 mLs Intravenous Stopped 07/04/16 0017)  insulin aspart (novoLOG) injection 6 Units (6 Units Subcutaneous Given 07/03/16 2308)     Initial Impression / Assessment and Plan / ED Course  I have reviewed the triage vital signs and the nursing notes.  Pertinent labs & imaging results that were available during my care of the patient were reviewed by me and considered in my medical decision making (see chart for details).     Patient presents with swelling, redness and tenderness to left lower extremity surrounding an abrasion that occurred 4 days ago after slipping  off a ladder. Denies head injury. Reports associated pain to left ankle. Denies any other recent fall, trauma or injury. Denies fever or drainage. Reports history of diabetes and notes he has been out of his medications for the past few weeks. Denies currently having a PCP. Denies any other associated symptoms. VSS. Exam showed multiple healing abrasions noted to bilateral shins. 3 cm abrasion present to left distal shin with mild associated surrounding swelling and erythema. No drainage, active bleeding. Mild swelling present to left ankle with mild tenderness to medial malleolus. No calf swelling or tenderness. BLE neurovascularly intact. CBG 339, no anion gap. Patient given IV fluids and 6 units of insulin in the ED. Left ankle x-ray revealed diffuse soft tissue swelling of left lower leg and ankle concerning for cellulitis. No acute fracture or bone destruction noted. Plan to treat patient with antibiotics for cellulitis and advised to return to the ED in 2 days for wound recheck.  Area was marked in the ED prior to discharge. Repeat CBG 274. Patient given information for PCP follow-up and advise to schedule an appointment within the next week for follow-up evaluation and further management of his diabetes. Discussed return precautions.  Final Clinical Impressions(s) / ED Diagnoses   Final diagnoses:  Cellulitis of left lower extremity  Hyperglycemia    New Prescriptions Discharge Medication List as of 07/04/2016 12:15 AM    START taking these medications   Details  cephALEXin (KEFLEX) 500 MG capsule Take 1 capsule (500 mg total) by mouth 2 (two) times daily., Starting Wed 07/04/2016, Until Wed 07/11/2016, Print    sulfamethoxazole-trimethoprim (BACTRIM DS,SEPTRA DS) 800-160 MG tablet Take 1 tablet by mouth 2 (two) times daily., Starting Wed 07/04/2016, Until Wed 07/11/2016, Print       I personally performed the services described in this documentation, which was scribed in my presence. The recorded information has been reviewed and is accurate.    Satira Sark [place], [place] 07/04/16 0034    Vanetta Mulders, MD 07/04/16 2325

## 2016-07-04 MED ORDER — CEPHALEXIN 500 MG PO CAPS: 500.0000 mg | ORAL_CAPSULE | Freq: Two times a day (BID) | ORAL | 0 refills | Status: AC

## 2016-07-04 MED ORDER — SULFAMETHOXAZOLE-TRIMETHOPRIM 800-160 MG PO TABS: 1.0000 | ORAL_TABLET | Freq: Two times a day (BID) | ORAL | 0 refills | Status: AC

## 2016-07-04 NOTE — Discharge Instructions (Signed)
Take your antibiotics as prescribed until completed. Return to the emergency department in 2 days for wound recheck. I recommend following up with a primary care provider's office with the below to schedule a follow-up appointment within the next week for reevaluation and further management of your diabetes. Return to the emergency department if symptoms worsen or new onset of fever, abdominal pain, vomiting, shortness of breath, difficulty breathing, pain or burning with urination, redness, swelling, warmth, drainage, numbness, weakness.

## 2016-12-27 ENCOUNTER — Ambulatory Visit: Payer: Self-pay | Admitting: Emergency Medicine

## 2016-12-31 ENCOUNTER — Ambulatory Visit: Payer: Self-pay | Admitting: Emergency Medicine

## 2018-10-10 ENCOUNTER — Encounter (HOSPITAL_BASED_OUTPATIENT_CLINIC_OR_DEPARTMENT_OTHER): Payer: Self-pay | Admitting: Emergency Medicine

## 2018-10-10 ENCOUNTER — Emergency Department (HOSPITAL_BASED_OUTPATIENT_CLINIC_OR_DEPARTMENT_OTHER): Payer: Medicaid Other

## 2018-10-10 ENCOUNTER — Other Ambulatory Visit: Payer: Self-pay

## 2018-10-10 ENCOUNTER — Emergency Department (HOSPITAL_BASED_OUTPATIENT_CLINIC_OR_DEPARTMENT_OTHER)
Admission: EM | Admit: 2018-10-10 | Discharge: 2018-10-10 | Disposition: A | Discharge status: Home / Self Care | Payer: Medicaid Other | Attending: Emergency Medicine | Admitting: Emergency Medicine

## 2018-10-10 DIAGNOSIS — T25221A Burn of second degree of right foot, initial encounter: Secondary | ICD-10-CM | POA: Diagnosis not present

## 2018-10-10 DIAGNOSIS — R0602 Shortness of breath: Secondary | ICD-10-CM | POA: Diagnosis not present

## 2018-10-10 DIAGNOSIS — E1165 Type 2 diabetes mellitus with hyperglycemia: Secondary | ICD-10-CM | POA: Diagnosis not present

## 2018-10-10 DIAGNOSIS — Z7984 Long term (current) use of oral hypoglycemic drugs: Secondary | ICD-10-CM | POA: Insufficient documentation

## 2018-10-10 DIAGNOSIS — I10 Essential (primary) hypertension: Secondary | ICD-10-CM | POA: Diagnosis not present

## 2018-10-10 DIAGNOSIS — Z79899 Other long term (current) drug therapy: Secondary | ICD-10-CM | POA: Insufficient documentation

## 2018-10-10 DIAGNOSIS — Y92009 Unspecified place in unspecified non-institutional (private) residence as the place of occurrence of the external cause: Secondary | ICD-10-CM | POA: Diagnosis not present

## 2018-10-10 DIAGNOSIS — Y998 Other external cause status: Secondary | ICD-10-CM | POA: Diagnosis not present

## 2018-10-10 DIAGNOSIS — X118XXA Contact with other hot tap-water, initial encounter: Secondary | ICD-10-CM | POA: Insufficient documentation

## 2018-10-10 DIAGNOSIS — T25222A Burn of second degree of left foot, initial encounter: Secondary | ICD-10-CM | POA: Insufficient documentation

## 2018-10-10 DIAGNOSIS — T25021A Burn of unspecified degree of right foot, initial encounter: Secondary | ICD-10-CM | POA: Diagnosis present

## 2018-10-10 DIAGNOSIS — Y9389 Activity, other specified: Secondary | ICD-10-CM | POA: Insufficient documentation

## 2018-10-10 DIAGNOSIS — R739 Hyperglycemia, unspecified: Secondary | ICD-10-CM

## 2018-10-10 LAB — BRAIN NATRIURETIC PEPTIDE: B Natriuretic Peptide: 32.5 pg/mL (ref 0.0–100.0)

## 2018-10-10 LAB — CBC
HCT: 41.1 % (ref 39.0–52.0)
Hemoglobin: 12.2 g/dL — ABNORMAL LOW (ref 13.0–17.0)
MCH: 21.4 pg — ABNORMAL LOW (ref 26.0–34.0)
MCHC: 29.7 g/dL — ABNORMAL LOW (ref 30.0–36.0)
MCV: 72 fL — ABNORMAL LOW (ref 80.0–100.0)
Platelets: 283 10*3/uL (ref 150–400)
RBC: 5.71 MIL/uL (ref 4.22–5.81)
RDW: 14.1 % (ref 11.5–15.5)
WBC: 10 10*3/uL (ref 4.0–10.5)
nRBC: 0 % (ref 0.0–0.2)

## 2018-10-10 LAB — BASIC METABOLIC PANEL
Anion gap: 8 (ref 5–15)
BUN: 25 mg/dL — ABNORMAL HIGH (ref 6–20)
CO2: 25 mmol/L (ref 22–32)
Calcium: 8.9 mg/dL (ref 8.9–10.3)
Chloride: 98 mmol/L (ref 98–111)
Creatinine, Ser: 1.37 mg/dL — ABNORMAL HIGH (ref 0.61–1.24)
GFR calc Af Amer: >60 mL/min (ref >60)
GFR calc non Af Amer: >60 mL/min (ref >60)
Glucose, Bld: 417 mg/dL — ABNORMAL HIGH (ref 70–99)
Potassium: 4.7 mmol/L (ref 3.5–5.1)
Sodium: 131 mmol/L — ABNORMAL LOW (ref 135–145)

## 2018-10-10 LAB — TROPONIN I: Troponin I: <0.03 ng/mL (ref <0.03)

## 2018-10-10 LAB — CBG MONITORING, ED: Glucose-Capillary: 395 mg/dL — ABNORMAL HIGH (ref 70–99)

## 2018-10-10 MED ORDER — BACITRACIN ZINC 500 UNIT/GM EX OINT
1.0000 "application " | TOPICAL_OINTMENT | Freq: Once | CUTANEOUS | Status: AC
  Administered 2018-10-10: 1 via TOPICAL
  Filled 2018-10-10: qty 28.35

## 2018-10-10 MED ORDER — GLIPIZIDE 5 MG PO TABS: 2.5000 mg | ORAL_TABLET | Freq: Every day | ORAL | 1 refills | Status: DC

## 2018-10-10 MED ORDER — HYDROMORPHONE HCL 1 MG/ML IJ SOLN
1.0000 mg | Freq: Once | INTRAMUSCULAR | Status: AC
  Administered 2018-10-10: 1 mg via INTRAVENOUS
  Filled 2018-10-10: qty 1

## 2018-10-10 MED FILL — glipiZIDE 5 MG TABS: 5 | 60 days supply | Qty: 30 | Fill #0

## 2018-10-10 NOTE — ED Triage Notes (Addendum)
Pt reports he became SOB during breakfast this morning. Denies chest pain. He states he was felt light headed this morning. Pt states he has been out of BP and diabetes medicine for "a long time"
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
## 2018-10-13 ENCOUNTER — Telehealth: Payer: Self-pay | Admitting: *Deleted
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
## 2018-10-15 ENCOUNTER — Emergency Department (HOSPITAL_COMMUNITY)
Admission: EM | Admit: 2018-10-15 | Discharge: 2018-10-16 | Disposition: A | Discharge status: Other Acute Inpatient Hospital | Payer: Medicaid Other | Attending: Emergency Medicine | Admitting: Emergency Medicine

## 2018-10-15 ENCOUNTER — Emergency Department (HOSPITAL_COMMUNITY): Payer: Medicaid Other

## 2018-10-15 ENCOUNTER — Other Ambulatory Visit: Payer: Self-pay

## 2018-10-15 ENCOUNTER — Encounter (HOSPITAL_COMMUNITY): Payer: Self-pay

## 2018-10-15 DIAGNOSIS — Z7984 Long term (current) use of oral hypoglycemic drugs: Secondary | ICD-10-CM | POA: Diagnosis not present

## 2018-10-15 DIAGNOSIS — Z20828 Contact with and (suspected) exposure to other viral communicable diseases: Secondary | ICD-10-CM | POA: Diagnosis not present

## 2018-10-15 DIAGNOSIS — R600 Localized edema: Secondary | ICD-10-CM | POA: Diagnosis not present

## 2018-10-15 DIAGNOSIS — A419 Sepsis, unspecified organism: Secondary | ICD-10-CM | POA: Diagnosis not present

## 2018-10-15 DIAGNOSIS — I10 Essential (primary) hypertension: Secondary | ICD-10-CM | POA: Diagnosis not present

## 2018-10-15 DIAGNOSIS — T25021D Burn of unspecified degree of right foot, subsequent encounter: Secondary | ICD-10-CM | POA: Diagnosis not present

## 2018-10-15 DIAGNOSIS — T25022D Burn of unspecified degree of left foot, subsequent encounter: Secondary | ICD-10-CM | POA: Insufficient documentation

## 2018-10-15 DIAGNOSIS — R5381 Other malaise: Secondary | ICD-10-CM | POA: Diagnosis present

## 2018-10-15 DIAGNOSIS — E119 Type 2 diabetes mellitus without complications: Secondary | ICD-10-CM | POA: Insufficient documentation

## 2018-10-15 DIAGNOSIS — X118XXD Contact with other hot tap-water, subsequent encounter: Secondary | ICD-10-CM | POA: Insufficient documentation

## 2018-10-15 DIAGNOSIS — Z23 Encounter for immunization: Secondary | ICD-10-CM | POA: Insufficient documentation

## 2018-10-15 LAB — COMPREHENSIVE METABOLIC PANEL
ALT: 47 U/L — ABNORMAL HIGH (ref 0–44)
AST: 30 U/L (ref 15–41)
Albumin: 2.4 g/dL — ABNORMAL LOW (ref 3.5–5.0)
Alkaline Phosphatase: 177 U/L — ABNORMAL HIGH (ref 38–126)
Anion gap: 15 (ref 5–15)
BUN: 16 mg/dL (ref 6–20)
CO2: 23 mmol/L (ref 22–32)
Calcium: 8.9 mg/dL (ref 8.9–10.3)
Chloride: 92 mmol/L — ABNORMAL LOW (ref 98–111)
Creatinine, Ser: 1.52 mg/dL — ABNORMAL HIGH (ref 0.61–1.24)
GFR calc Af Amer: >60 mL/min (ref >60)
GFR calc non Af Amer: 54 mL/min — ABNORMAL LOW (ref >60)
Glucose, Bld: 380 mg/dL — ABNORMAL HIGH (ref 70–99)
Potassium: 4.3 mmol/L (ref 3.5–5.1)
Sodium: 130 mmol/L — ABNORMAL LOW (ref 135–145)
Total Bilirubin: 0.9 mg/dL (ref 0.3–1.2)
Total Protein: 7.2 g/dL (ref 6.5–8.1)

## 2018-10-15 LAB — URINALYSIS, ROUTINE W REFLEX MICROSCOPIC
Bilirubin Urine: NEGATIVE
Glucose, UA: >=500 mg/dL — AB
Ketones, ur: 20 mg/dL — AB
Leukocytes,Ua: NEGATIVE
Nitrite: NEGATIVE
Protein, ur: >=300 mg/dL — AB
Specific Gravity, Urine: 1.022 (ref 1.005–1.030)
pH: 5 (ref 5.0–8.0)

## 2018-10-15 LAB — CBC WITH DIFFERENTIAL/PLATELET
Abs Immature Granulocytes: 0.58 10*3/uL — ABNORMAL HIGH (ref 0.00–0.07)
Basophils Absolute: 0.1 10*3/uL (ref 0.0–0.1)
Basophils Relative: 0 %
Eosinophils Absolute: 0 10*3/uL (ref 0.0–0.5)
Eosinophils Relative: 0 %
HCT: 35.1 % — ABNORMAL LOW (ref 39.0–52.0)
Hemoglobin: 10.9 g/dL — ABNORMAL LOW (ref 13.0–17.0)
Immature Granulocytes: 3 %
Lymphocytes Relative: 10 %
Lymphs Abs: 1.8 10*3/uL (ref 0.7–4.0)
MCH: 21.6 pg — ABNORMAL LOW (ref 26.0–34.0)
MCHC: 31.1 g/dL (ref 30.0–36.0)
MCV: 69.6 fL — ABNORMAL LOW (ref 80.0–100.0)
Monocytes Absolute: 1.7 10*3/uL — ABNORMAL HIGH (ref 0.1–1.0)
Monocytes Relative: 9 %
Neutro Abs: 14.4 10*3/uL — ABNORMAL HIGH (ref 1.7–7.7)
Neutrophils Relative %: 78 %
Platelets: 300 10*3/uL (ref 150–400)
RBC: 5.04 MIL/uL (ref 4.22–5.81)
RDW: 13.9 % (ref 11.5–15.5)
WBC: 18.5 10*3/uL — ABNORMAL HIGH (ref 4.0–10.5)
nRBC: 0 % (ref 0.0–0.2)

## 2018-10-15 LAB — PROTIME-INR
INR: 1.1 (ref 0.8–1.2)
Prothrombin Time: 14.2 seconds (ref 11.4–15.2)

## 2018-10-15 LAB — LACTIC ACID, PLASMA: Lactic Acid, Venous: 1.8 mmol/L (ref 0.5–1.9)

## 2018-10-15 LAB — SARS CORONAVIRUS 2: SARS Coronavirus 2: NOT DETECTED

## 2018-10-15 LAB — CBG MONITORING, ED: Glucose-Capillary: 376 mg/dL — ABNORMAL HIGH (ref 70–99)

## 2018-10-15 MED ORDER — SODIUM CHLORIDE 0.9 % IV BOLUS (SEPSIS)
1000.0000 mL | Freq: Once | INTRAVENOUS | Status: AC
  Administered 2018-10-15: 1000 mL via INTRAVENOUS

## 2018-10-15 MED ORDER — VANCOMYCIN HCL 10 G IV SOLR
2500.0000 mg | Freq: Once | INTRAVENOUS | Status: AC
  Administered 2018-10-15: 2500 mg via INTRAVENOUS
  Filled 2018-10-15: qty 2500

## 2018-10-15 MED ORDER — ACETAMINOPHEN 325 MG PO TABS
650.0000 mg | ORAL_TABLET | Freq: Once | ORAL | Status: AC
  Administered 2018-10-15: 650 mg via ORAL
  Filled 2018-10-15: qty 2

## 2018-10-15 MED ORDER — SODIUM CHLORIDE 0.9% FLUSH: 3.0000 mL | Freq: Once | INTRAVENOUS | Status: DC

## 2018-10-15 MED ORDER — VANCOMYCIN HCL IN DEXTROSE 1-5 GM/200ML-% IV SOLN: 1000.0000 mg | Freq: Once | INTRAVENOUS | Status: DC

## 2018-10-15 MED ORDER — SODIUM CHLORIDE 0.9 % IV SOLN
1000.0000 mL | INTRAVENOUS | Status: DC
  Administered 2018-10-15: 1000 mL via INTRAVENOUS

## 2018-10-15 MED ORDER — SODIUM CHLORIDE 0.9 % IV SOLN
2.0000 g | Freq: Once | INTRAVENOUS | Status: AC
  Administered 2018-10-15: 2 g via INTRAVENOUS
  Filled 2018-10-15: qty 2

## 2018-10-15 MED ORDER — VANCOMYCIN HCL 10 G IV SOLR: 1750.0000 mg | INTRAVENOUS | Status: DC

## 2018-10-15 MED ORDER — SODIUM CHLORIDE 0.9 % IV SOLN: 2.0000 g | Freq: Three times a day (TID) | INTRAVENOUS | Status: DC

## 2018-10-15 MED ORDER — TETANUS-DIPHTH-ACELL PERTUSSIS 5-2.5-18.5 LF-MCG/0.5 IM SUSP
0.5000 mL | Freq: Once | INTRAMUSCULAR | Status: AC
  Administered 2018-10-16: 0.5 mL via INTRAMUSCULAR
  Filled 2018-10-15: qty 0.5

## 2018-10-15 MED ORDER — FENTANYL CITRATE (PF) 100 MCG/2ML IJ SOLN
50.0000 ug | Freq: Once | INTRAMUSCULAR | Status: AC
  Administered 2018-10-16: 50 ug via INTRAVENOUS
  Filled 2018-10-15: qty 2

## 2018-10-15 MED ORDER — METRONIDAZOLE IN NACL 5-0.79 MG/ML-% IV SOLN
500.0000 mg | Freq: Once | INTRAVENOUS | Status: AC
  Administered 2018-10-15: 500 mg via INTRAVENOUS
  Filled 2018-10-15: qty 100
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
## 2018-10-15 NOTE — ED Notes (Signed)
Report given to charge nurse in ER at [facility]. All questions answered. Report given to [place]

## 2018-10-15 NOTE — Progress Notes (Signed)
Pharmacy Antibiotic Note  Steven Cochran is a 48 y.o. male admitted on 10/15/2018 with sepsis.  Pharmacy has been consulted for vancomycin/cefepime dosing. Tmax/24h 103.1, WBC 18.5. SCr 1.52 on admit.  Plan: Cefepime 2g IV x 1; then 2g IV q8h Vancomycin 2500mg  IV x1; then Vancomycin 1750 mg IV Q 24 hrs. Goal AUC 400-550. Expected AUC: 485 SCr used: 1.52 Flagyl 500mg  IV x 1 per EDP - f/u if needs to continue Monitor clinical progress, c/s, renal function F/u de-escalation plan/LOT, vancomycin levels as indicated      Temp (24hrs), Avg:103.1 F (39.5 C), Min:103 F (39.4 C), Max:103.1 F (39.5 C)  Recent Labs  Lab 10/10/18 1205 10/15/18 2013  WBC 10.0 18.5*  CREATININE 1.37*  --     Estimated Creatinine Clearance: 90.1 mL/min (A) (by C-G formula based on SCr of 1.37 mg/dL (H)).    No Known Allergies  Antimicrobials this admission: 6/10 vancomycin >>  6/10 cefepime >>  6/10 flagyl x 1  Dose adjustments this admission:   Microbiology results:   Elicia Lamp, PharmD, BCPS Clinical Pharmacist 10/15/2018 8:55 PM

## 2018-10-17 LAB — URINE CULTURE

## 2018-10-20 ENCOUNTER — Inpatient Hospital Stay: Payer: Self-pay | Admitting: Family Medicine

## 2018-10-21 LAB — CULTURE, BLOOD (ROUTINE X 2)
Culture: NO GROWTH
Culture: NO GROWTH
Special Requests: ADEQUATE
Special Requests: ADEQUATE

## 2019-10-01 ENCOUNTER — Emergency Department (HOSPITAL_COMMUNITY): Payer: Medicaid Other

## 2019-10-01 ENCOUNTER — Observation Stay (HOSPITAL_COMMUNITY)
Admission: EM | Admit: 2019-10-01 | Discharge: 2019-10-02 | Disposition: A | Discharge status: Home / Self Care | Payer: Medicaid Other | Attending: Family Medicine | Admitting: Family Medicine

## 2019-10-01 ENCOUNTER — Encounter (HOSPITAL_COMMUNITY): Payer: Self-pay | Admitting: Emergency Medicine

## 2019-10-01 ENCOUNTER — Other Ambulatory Visit: Payer: Self-pay

## 2019-10-01 DIAGNOSIS — I110 Hypertensive heart disease with heart failure: Secondary | ICD-10-CM | POA: Insufficient documentation

## 2019-10-01 DIAGNOSIS — I5022 Chronic systolic (congestive) heart failure: Secondary | ICD-10-CM | POA: Insufficient documentation

## 2019-10-01 DIAGNOSIS — T68XXXA Hypothermia, initial encounter: Secondary | ICD-10-CM | POA: Insufficient documentation

## 2019-10-01 DIAGNOSIS — Z79899 Other long term (current) drug therapy: Secondary | ICD-10-CM | POA: Insufficient documentation

## 2019-10-01 DIAGNOSIS — E1065 Type 1 diabetes mellitus with hyperglycemia: Secondary | ICD-10-CM | POA: Diagnosis not present

## 2019-10-01 DIAGNOSIS — X58XXXA Exposure to other specified factors, initial encounter: Secondary | ICD-10-CM | POA: Diagnosis not present

## 2019-10-01 DIAGNOSIS — Z6831 Body mass index (BMI) 31.0-31.9, adult: Secondary | ICD-10-CM | POA: Diagnosis not present

## 2019-10-01 DIAGNOSIS — E875 Hyperkalemia: Secondary | ICD-10-CM | POA: Diagnosis not present

## 2019-10-01 DIAGNOSIS — Z888 Allergy status to other drugs, medicaments and biological substances status: Secondary | ICD-10-CM | POA: Diagnosis not present

## 2019-10-01 DIAGNOSIS — Y9384 Activity, sleeping: Secondary | ICD-10-CM | POA: Insufficient documentation

## 2019-10-01 DIAGNOSIS — Z23 Encounter for immunization: Secondary | ICD-10-CM | POA: Insufficient documentation

## 2019-10-01 DIAGNOSIS — E162 Hypoglycemia, unspecified: Secondary | ICD-10-CM

## 2019-10-01 DIAGNOSIS — Z794 Long term (current) use of insulin: Secondary | ICD-10-CM | POA: Insufficient documentation

## 2019-10-01 DIAGNOSIS — E669 Obesity, unspecified: Secondary | ICD-10-CM | POA: Diagnosis not present

## 2019-10-01 DIAGNOSIS — E10649 Type 1 diabetes mellitus with hypoglycemia without coma: Principal | ICD-10-CM | POA: Insufficient documentation

## 2019-10-01 DIAGNOSIS — Z20822 Contact with and (suspected) exposure to covid-19: Secondary | ICD-10-CM | POA: Diagnosis not present

## 2019-10-01 HISTORY — DX: Heart failure, unspecified: I50.9

## 2019-10-01 LAB — CBC
HCT: 38.9 % — ABNORMAL LOW (ref 39.0–52.0)
Hemoglobin: 11.9 g/dL — ABNORMAL LOW (ref 13.0–17.0)
MCH: 22.2 pg — ABNORMAL LOW (ref 26.0–34.0)
MCHC: 30.6 g/dL (ref 30.0–36.0)
MCV: 72.6 fL — ABNORMAL LOW (ref 80.0–100.0)
Platelets: 298 10*3/uL (ref 150–400)
RBC: 5.36 MIL/uL (ref 4.22–5.81)
RDW: 15.5 % (ref 11.5–15.5)
WBC: 7.7 10*3/uL (ref 4.0–10.5)
nRBC: 0 % (ref 0.0–0.2)

## 2019-10-01 LAB — BASIC METABOLIC PANEL
Anion gap: 7 (ref 5–15)
Anion gap: 7 (ref 5–15)
BUN: 43 mg/dL — ABNORMAL HIGH (ref 6–20)
BUN: 41 mg/dL — ABNORMAL HIGH (ref 6–20)
CO2: 23 mmol/L (ref 22–32)
CO2: 24 mmol/L (ref 22–32)
Calcium: 9.1 mg/dL (ref 8.9–10.3)
Calcium: 9.1 mg/dL (ref 8.9–10.3)
Chloride: 104 mmol/L (ref 98–111)
Chloride: 106 mmol/L (ref 98–111)
Creatinine, Ser: 1.23 mg/dL (ref 0.61–1.24)
Creatinine, Ser: 1.1 mg/dL (ref 0.61–1.24)
GFR calc Af Amer: >60 mL/min (ref >60)
GFR calc Af Amer: >60 mL/min (ref >60)
GFR calc non Af Amer: >60 mL/min (ref >60)
GFR calc non Af Amer: >60 mL/min (ref >60)
Glucose, Bld: 169 mg/dL — ABNORMAL HIGH (ref 70–99)
Glucose, Bld: 114 mg/dL — ABNORMAL HIGH (ref 70–99)
Potassium: 6.5 mmol/L (ref 3.5–5.1)
Potassium: 5.5 mmol/L — ABNORMAL HIGH (ref 3.5–5.1)
Sodium: 134 mmol/L — ABNORMAL LOW (ref 135–145)
Sodium: 137 mmol/L (ref 135–145)

## 2019-10-01 LAB — T4, FREE: Free T4: 0.82 ng/dL (ref 0.61–1.12)

## 2019-10-01 LAB — GLUCOSE, CAPILLARY: Glucose-Capillary: 161 mg/dL — ABNORMAL HIGH (ref 70–99)

## 2019-10-01 LAB — TSH: TSH: 0.884 u[IU]/mL (ref 0.350–4.500)

## 2019-10-01 LAB — CBG MONITORING, ED
Glucose-Capillary: 138 mg/dL — ABNORMAL HIGH (ref 70–99)
Glucose-Capillary: 116 mg/dL — ABNORMAL HIGH (ref 70–99)
Glucose-Capillary: 119 mg/dL — ABNORMAL HIGH (ref 70–99)
Glucose-Capillary: 88 mg/dL (ref 70–99)

## 2019-10-01 LAB — HEMOGLOBIN A1C
Hgb A1c MFr Bld: 9.2 % — ABNORMAL HIGH (ref 4.8–5.6)
Mean Plasma Glucose: 217.34 mg/dL

## 2019-10-01 LAB — SARS CORONAVIRUS 2 BY RT PCR (HOSPITAL ORDER, PERFORMED IN ~~LOC~~ HOSPITAL LAB): SARS Coronavirus 2: NEGATIVE

## 2019-10-01 MED ORDER — FUROSEMIDE 20 MG PO TABS: 20.0000 mg | ORAL_TABLET | Freq: Two times a day (BID) | ORAL | Status: DC | PRN

## 2019-10-01 MED ORDER — SODIUM CHLORIDE 0.9% FLUSH
3.0000 mL | Freq: Two times a day (BID) | INTRAVENOUS | Status: DC
  Administered 2019-10-02: 3 mL via INTRAVENOUS

## 2019-10-01 MED ORDER — SODIUM CHLORIDE 0.9% FLUSH
3.0000 mL | Freq: Two times a day (BID) | INTRAVENOUS | Status: DC
  Administered 2019-10-01 – 2019-10-02 (×2): 3 mL via INTRAVENOUS

## 2019-10-01 MED ORDER — INSULIN ASPART 100 UNIT/ML ~~LOC~~ SOLN
5.0000 [IU] | Freq: Once | SUBCUTANEOUS | Status: AC
  Administered 2019-10-01: 5 [IU] via INTRAVENOUS
  Filled 2019-10-01: qty 0.05

## 2019-10-01 MED ORDER — SODIUM CHLORIDE 0.9% FLUSH: 3.0000 mL | INTRAVENOUS | Status: DC | PRN

## 2019-10-01 MED ORDER — ENOXAPARIN SODIUM 40 MG/0.4ML ~~LOC~~ SOLN
40.0000 mg | SUBCUTANEOUS | Status: DC
  Administered 2019-10-01: 40 mg via SUBCUTANEOUS
  Filled 2019-10-01: qty 0.4

## 2019-10-01 MED ORDER — SODIUM CHLORIDE 0.9 % IV SOLN: 250.0000 mL | INTRAVENOUS | Status: DC | PRN

## 2019-10-01 MED ORDER — INSULIN ASPART 100 UNIT/ML ~~LOC~~ SOLN
5.0000 [IU] | Freq: Once | SUBCUTANEOUS | Status: DC
  Filled 2019-10-01: qty 0.05

## 2019-10-01 MED ORDER — HYDRALAZINE HCL 20 MG/ML IJ SOLN
10.0000 mg | Freq: Four times a day (QID) | INTRAMUSCULAR | Status: DC | PRN
  Administered 2019-10-02: 10 mg via INTRAVENOUS
  Filled 2019-10-01: qty 1

## 2019-10-01 MED ORDER — MAGNESIUM HYDROXIDE 400 MG/5ML PO SUSP: 30.0000 mL | Freq: Every day | ORAL | Status: DC | PRN

## 2019-10-01 MED ORDER — ONDANSETRON HCL 4 MG PO TABS: 4.0000 mg | ORAL_TABLET | Freq: Four times a day (QID) | ORAL | Status: DC | PRN

## 2019-10-01 MED ORDER — DEXTROSE 50 % IV SOLN
INTRAVENOUS | Status: AC
  Filled 2019-10-01: qty 50

## 2019-10-01 MED ORDER — ONDANSETRON HCL 4 MG/2ML IJ SOLN: 4.0000 mg | Freq: Four times a day (QID) | INTRAMUSCULAR | Status: DC | PRN

## 2019-10-01 MED ORDER — SENNA 8.6 MG PO TABS
1.0000 | ORAL_TABLET | Freq: Two times a day (BID) | ORAL | Status: DC
  Administered 2019-10-01 – 2019-10-02 (×2): 8.6 mg via ORAL
  Filled 2019-10-01 (×2): qty 1

## 2019-10-01 MED ORDER — INSULIN ASPART PROT & ASPART (70-30 MIX) 100 UNIT/ML ~~LOC~~ SUSP
20.0000 [IU] | Freq: Two times a day (BID) | SUBCUTANEOUS | Status: DC
  Administered 2019-10-01 – 2019-10-02 (×2): 20 [IU] via SUBCUTANEOUS
  Filled 2019-10-01 (×2): qty 10

## 2019-10-01 MED ORDER — INSULIN ASPART 100 UNIT/ML ~~LOC~~ SOLN
0.0000 [IU] | Freq: Every day | SUBCUTANEOUS | Status: DC
  Filled 2019-10-01: qty 0.05

## 2019-10-01 MED ORDER — CARVEDILOL 25 MG PO TABS
25.0000 mg | ORAL_TABLET | Freq: Two times a day (BID) | ORAL | Status: DC
  Administered 2019-10-01 – 2019-10-02 (×2): 25 mg via ORAL
  Filled 2019-10-01 (×2): qty 1

## 2019-10-01 MED ORDER — PNEUMOCOCCAL VAC POLYVALENT 25 MCG/0.5ML IJ INJ
0.5000 mL | INJECTION | INTRAMUSCULAR | Status: AC
  Administered 2019-10-02: 0.5 mL via INTRAMUSCULAR
  Filled 2019-10-01: qty 0.5

## 2019-10-01 MED ORDER — SORBITOL 70 % SOLN
30.0000 mL | Freq: Every day | Status: DC | PRN
  Filled 2019-10-01: qty 30

## 2019-10-01 MED ORDER — SACUBITRIL-VALSARTAN 49-51 MG PO TABS
1.0000 | ORAL_TABLET | Freq: Two times a day (BID) | ORAL | Status: DC
  Administered 2019-10-01 – 2019-10-02 (×2): 1 via ORAL
  Filled 2019-10-01 (×5): qty 1

## 2019-10-01 MED ORDER — INSULIN ASPART 100 UNIT/ML ~~LOC~~ SOLN
0.0000 [IU] | Freq: Three times a day (TID) | SUBCUTANEOUS | Status: DC
  Filled 2019-10-01: qty 0.09

## 2019-10-01 MED ORDER — CALCIUM GLUCONATE-NACL 1-0.675 GM/50ML-% IV SOLN
INTRAVENOUS | Status: AC
  Filled 2019-10-01: qty 50

## 2019-10-01 NOTE — ED Provider Notes (Signed)
[facility]-EMERGENCY DEPT Provider Note   CSN: [id number] Arrival date & time: 10/01/19  9833     History Chief Complaint  Patient presents with  . Hypoglycemia    Steven Cochran is a 49 y.o. male.  HPI 49 year old male presents with hypoglycemia.  Patient woke up in the middle the night and was confused and did not feel well.  He states that last night he gave himself 25 units of his insulin during mealtime but his glucose was still high and so took 20 more units.  Then he had hypoglycemia where his glucose was in the 30s when EMS arrived.  They gave him oral glucose and he improved.  He otherwise feels fine except he feels very cold right now.  Temperature not obtained in triage.  He denies any acute illness complaints such as cough, fever, vomiting, etc.   Past Medical History:  Diagnosis Date  . CHF (congestive heart failure) (HCC)   . Diabetes mellitus   . Hypertension     Patient Active Problem List   Diagnosis Date Noted  . HTN (hypertension) 06/30/2012    History reviewed. No pertinent surgical history.     No family history on file.  Social History   Tobacco Use  . Smoking status: Never Smoker  . Smokeless tobacco: Never Used  Substance Use Topics  . Alcohol use: No  . Drug use: No    Home Medications Prior to Admission medications   Medication Sig Start Date End Date Taking? Authorizing Provider  amLODipine (NORVASC) 5 MG tablet Take 1 tablet (5 mg total) by mouth daily. 09/20/15   Gwyneth Sprout, MD  glipiZIDE (GLUCOTROL) 5 MG tablet Take 0.5 tablets (2.5 mg total) by mouth daily before breakfast. 10/10/18   Linwood Dibbles, MD  lisinopril (PRINIVIL,ZESTRIL) 10 MG tablet Take 1 tablet (10 mg total) by mouth daily. 09/20/15   Gwyneth Sprout, MD  metFORMIN (GLUCOPHAGE) 1000 MG tablet Take 1 tablet (1,000 mg total) by mouth 2 (two) times daily with a meal. 09/20/15   Gwyneth Sprout, MD    Allergies    Patient has no known  allergies.  Review of Systems   Review of Systems  Constitutional: Negative for fever.  Respiratory: Negative for shortness of breath.   Cardiovascular: Negative for chest pain.  Gastrointestinal: Negative for abdominal pain and vomiting.  Genitourinary: Negative for dysuria.  Psychiatric/Behavioral: Positive for confusion.  All other systems reviewed and are negative.   Physical Exam Updated Vital Signs BP 136/88   Pulse 71   Temp (!) 97.4 F (36.3 C) (Oral)   Resp 16   SpO2 99%   Physical Exam Vitals and nursing note reviewed.  Constitutional:      General: He is not in acute distress.    Appearance: He is well-developed. He is not ill-appearing or diaphoretic.  HENT:     Head: Normocephalic and atraumatic.     Right Ear: External ear normal.     Left Ear: External ear normal.     Nose: Nose normal.  Eyes:     General:        Right eye: No discharge.        Left eye: No discharge.  Cardiovascular:     Rate and Rhythm: Normal rate and regular rhythm.     Heart sounds: Normal heart sounds.  Pulmonary:     Effort: Pulmonary effort is normal.     Breath sounds: Normal breath sounds.  Abdominal:  Palpations: Abdomen is soft.     Tenderness: There is no abdominal tenderness.  Musculoskeletal:     Cervical back: Neck supple.  Skin:    General: Skin is warm and dry.  Neurological:     Mental Status: He is alert and oriented to person, place, and time.     Comments: CN 3-12 grossly intact. 5/5 strength in all 4 extremities. Grossly normal sensation.  Psychiatric:        Mood and Affect: Mood is not anxious.     ED Results / Procedures / Treatments   Labs (all labs ordered are listed, but only abnormal results are displayed) Labs Reviewed  BASIC METABOLIC PANEL - Abnormal; Notable for the following components:      Result Value   Sodium 134 (*)    Potassium 6.5 (*)    Glucose, Bld 169 (*)    BUN 43 (*)    All other components within normal limits  CBC -  Abnormal; Notable for the following components:   Hemoglobin 11.9 (*)    HCT 38.9 (*)    MCV 72.6 (*)    MCH 22.2 (*)    All other components within normal limits  BASIC METABOLIC PANEL - Abnormal; Notable for the following components:   Potassium 5.5 (*)    Glucose, Bld 114 (*)    BUN 41 (*)    All other components within normal limits  CBG MONITORING, ED - Abnormal; Notable for the following components:   Glucose-Capillary 138 (*)    All other components within normal limits  CBG MONITORING, ED - Abnormal; Notable for the following components:   Glucose-Capillary 116 (*)    All other components within normal limits  CBG MONITORING, ED - Abnormal; Notable for the following components:   Glucose-Capillary 119 (*)    All other components within normal limits  TSH  T4, FREE  CBG MONITORING, ED    EKG EKG Interpretation  Date/Time:  Thursday Oct 01 2019 12:53:14 EDT Ventricular Rate:  68 PR Interval:    QRS Duration: 98 QT Interval:  443 QTC Calculation: 472 R Axis:   49 Text Interpretation: Sinus rhythm Borderline prolonged PR interval Confirmed by Sherwood Gambler [id number]) on 10/01/2019 1:57:02 PM   Radiology DG Chest Portable 1 View  Result Date: 10/01/2019 CLINICAL DATA:  Hypothermia EXAM: PORTABLE CHEST 1 VIEW COMPARISON:  10/15/2018 FINDINGS: Mild cardiomegaly. Both lungs are clear. The visualized skeletal structures are unremarkable. IMPRESSION: Mild cardiomegaly in AP portable projection without acute abnormality of the lungs. Electronically Signed   By: Eddie Candle M.D.   On: 10/01/2019 14:40    Procedures Procedures (including critical care time)  Medications Ordered in ED Medications  sodium chloride flush (NS) 0.9 % injection 3 mL (3 mLs Intravenous Not Given 10/01/19 1130)  sodium chloride flush (NS) 0.9 % injection 3 mL (has no administration in time range)  0.9 %  sodium chloride infusion (has no administration in time range)  dextrose 50 % solution (  Intravenous Given 10/01/19 1300)  calcium gluconate in NaCl 1-0.675 GM/50ML-% IVPB (  Stopped 10/01/19 1354)  insulin aspart (novoLOG) injection 5 Units (5 Units Intravenous Given 10/01/19 1300)    ED Course  I have reviewed the triage vital signs and the nursing notes.  Pertinent labs & imaging results that were available during my care of the patient were reviewed by me and considered in my medical decision making (see chart for details).    MDM Rules/Calculators/A&P  Patient's glucose has remained stable in the emergency department.  However he is cold and when his temperature was checked it was 93.  Placed on [employer]  He otherwise has no acute complaints.  Found to have hyperkalemia.  He was given insulin and glucose and I discussed with Dr. Glenna Fellows of nephrology.  She recommends recheck as this is probably more related to shifts back and forth because of the hypoglycemia.  BMP was obtained about 30 minutes after the insulin was given according to nurse and is mildly improved at 5.5.  Given this, discussed with Dr. Pola Corn for admission. Final Clinical Impression(s) / ED Diagnoses Final diagnoses:  Hypoglycemia  Hyperkalemia    Rx / DC Orders ED Discharge Orders    None       Pricilla Loveless, MD 10/01/19 1514

## 2019-10-01 NOTE — ED Triage Notes (Signed)
Per EMS, initial CBG 36-15 grams or oral glucose given CBG is now 69-patient refused IV-history of DM-took insulin this am

## 2019-10-01 NOTE — H&P (Signed)
[facility] History and Physical  Steven Cochran XNT:[id number] DOB: 1970/10/29 DOA: 10/01/2019 PCP: Patient, No Pcp Per  Admitted from: Home Chief Complaint: Hypoglycemia  History of Present Illness: Steven Cochran is a 49 y.o. male with history of DM2, HTN, severe systolic CHF (reports 15 to 20% EF). Patient presented to the ED with complaint of hypoglycemia. Patient takes ReliOn 70/30 twice a day 25 units. He took his scheduled dose last night with meal.  He woke up confusion the middle of the night.  His blood sugar was still elevated so he took additional 20 units. This morning, he woke up sweaty, he called EMS.  Blood sugar was in 30s.  Improved with oral glucose and was brought to the ED.  In the ED, patient was hypothermic with oral temperature of 93. Heart rate in the 60s, blood pressure elevated to 153/114. Breathing comfortably in room air.   Chemistry with potassium elevated to 6.5, glucose 169, BUN/creatinine 43/1.23. CBC with WBC 7.7, hemoglobin 11.9, platelets 298. In the ED, patient was given insulin 5 units.  Potassium was rechecked and it is down to 5.5. He was placed on a bear hugger for hypothermia. Hospitalist service was called for further evaluation management.  At the time of my evaluation, patient was on bear hugger.  He states that he has 15 to 20% EF.  He follows up with [facility]ologist at [facility].  He was told in the past that his potassium was high.  He was asked for dietary control of banana and other fruits.  His home list also consist of Entresto and Aldactone.  Review of Systems:  All systems were reviewed and were negative unless otherwise mentioned in the HPI  Past medical history: Past Medical History:  Diagnosis Date  . CHF (congestive heart failure) (HCC)   . Diabetes mellitus   . Hypertension     Past surgical history: History reviewed. No pertinent surgical history.  Social History:  reports that he has never smoked. He  has never used smokeless tobacco. He reports that he does not drink alcohol or use drugs.  Allergies:  Allergies  Allergen Reactions  . Metformin And Related Diarrhea    Family history:  No family history on file.   Home Meds: Prior to Admission medications   Medication Sig Start Date End Date Taking? Authorizing Provider  carvedilol (COREG) 25 MG tablet Take 25 mg by mouth 2 (two) times daily. 09/15/19  Yes [provider]  ENTRESTO 49-51 MG Take 1 tablet by mouth 2 (two) times daily.  06/24/19  Yes [provider]  furosemide (LASIX) 20 MG tablet Take 20 mg by mouth 2 (two) times daily as needed for fluid or edema.  07/13/19  Yes [provider]  NOVOLIN 70/30 RELION (70-30) 100 UNIT/ML injection Inject 25 Units into the skin 2 (two) times daily with a meal.  09/28/19  Yes [provider]  Polyvinyl Alcohol-Povidone PF (REFRESH) 1.4-0.6 % SOLN Apply 1 drop to eye daily as needed (dry eyes).   Yes [provider]  spironolactone (ALDACTONE) 25 MG tablet Take 25 mg by mouth 2 (two) times daily.  09/09/19  Yes [provider]  amLODipine (NORVASC) 5 MG tablet Take 1 tablet (5 mg total) by mouth daily. Patient not taking: Reported on 10/01/2019 09/20/15   Gwyneth Sprout, MD  glipiZIDE (GLUCOTROL) 5 MG tablet Take 0.5 tablets (2.5 mg total) by mouth daily before breakfast. Patient not taking: Reported on 10/01/2019 10/10/18   Lynelle Doctor,  Cletis Athens, MD  lisinopril (PRINIVIL,ZESTRIL) 10 MG tablet Take 1 tablet (10 mg total) by mouth daily. Patient not taking: Reported on 10/01/2019 09/20/15   Gwyneth Sprout, MD  metFORMIN (GLUCOPHAGE) 1000 MG tablet Take 1 tablet (1,000 mg total) by mouth 2 (two) times daily with a meal. Patient not taking: Reported on 10/01/2019 09/20/15   Gwyneth Sprout, MD    Physical Exam: Vitals:   10/01/19 1330 10/01/19 1400 10/01/19 1430 10/01/19 1530  BP: 136/84 (!) 147/97 136/88 (!) 148/98  Pulse: 65 70 71 80  Resp: (!) 9 14  16 18   Temp:  (!) 97.4 F (36.3 C)    TempSrc:  Oral    SpO2: 96% 100% 99% 97%   Wt Readings from Last 3 Encounters:  10/10/18 115.7 kg  07/03/16 127 kg  09/20/15 123.1 kg   There is no height or weight on file to calculate BMI.  General exam: Appears calm and comfortable.  Skin: No rashes, lesions or ulcers. HEENT: Atraumatic, normocephalic, supple neck, no obvious bleeding Lungs: Clear to auscultation bilaterally CVS: Regular rate and rhythm, no murmur GI/Abd soft, nontender, nondistended, bowel sound present CNS: Alert, awake, oriented x3 Psychiatry: Mood appropriate Extremities: Chronic stasis changes in both legs.  Trace bilateral pedal edema  Labs on Admission:   CBC: Recent Labs  Lab 10/01/19 1129  WBC 7.7  HGB 11.9*  HCT 38.9*  MCV 72.6*  PLT 298    Basic Metabolic Panel: Recent Labs  Lab 10/01/19 1129 10/01/19 1351  NA 134* 137  K 6.5* 5.5*  CL 104 106  CO2 23 24  GLUCOSE 169* 114*  BUN 43* 41*  CREATININE 1.23 1.10  CALCIUM 9.1 9.1    Liver Function Tests: No results for input(s): AST, ALT, ALKPHOS, BILITOT, PROT, ALBUMIN in the last 168 hours. No results for input(s): LIPASE, AMYLASE in the last 168 hours. No results for input(s): AMMONIA in the last 168 hours.  Cardiac Enzymes: No results for input(s): CKTOTAL, CKMB, CKMBINDEX, TROPONINI in the last 168 hours.  BNP (last 3 results) Recent Labs    10/10/18 1205  BNP 32.5    ProBNP (last 3 results) No results for input(s): PROBNP in the last 8760 hours.  CBG: Recent Labs  Lab 10/01/19 0823 10/01/19 1235 10/01/19 1334  GLUCAP 138* 116* 119*    Lipase  No results found for: LIPASE   Urinalysis    Component Value Date/Time   COLORURINE YELLOW 10/15/2018 2002   APPEARANCEUR CLOUDY (A) 10/15/2018 2002   LABSPEC 1.022 10/15/2018 2002   PHURINE 5.0 10/15/2018 2002   GLUCOSEU >=500 (A) 10/15/2018 2002   HGBUR SMALL (A) 10/15/2018 2002   BILIRUBINUR NEGATIVE 10/15/2018 2002    KETONESUR 20 (A) 10/15/2018 2002   PROTEINUR >=300 (A) 10/15/2018 2002   UROBILINOGEN 0.2 06/01/2013 0306   NITRITE NEGATIVE 10/15/2018 2002   LEUKOCYTESUR NEGATIVE 10/15/2018 2002     Drugs of Abuse  No results found for: LABOPIA, COCAINSCRNUR, LABBENZ, AMPHETMU, THCU, LABBARB    Radiological Exams on Admission: DG Chest Portable 1 View  Result Date: 10/01/2019 CLINICAL DATA:  Hypothermia EXAM: PORTABLE CHEST 1 VIEW COMPARISON:  10/15/2018 FINDINGS: Mild cardiomegaly. Both lungs are clear. The visualized skeletal structures are unremarkable. IMPRESSION: Mild cardiomegaly in AP portable projection without acute abnormality of the lungs. Electronically Signed   By: 12/15/2018 M.D.   On: 10/01/2019 14:40     ------------------------------------------------------------------------------------------------------ Assessment/Plan: Active Problems:   Hyperkalemia  Hypoglycemia Type 1 diabetes mellitus -Patient takes ReliOn  70/30 twice a day 25 units. He took his scheduled dose last night with meal and also took an additional dose last night for hyperglycemia.  Woke up with hypoglycemia this morning. -Blood glucose level improved now.  Continue to monitor. -Resume insulin from tonight at a lower dose of 20 units twice daily.  Hypothermia -Temperature 93 on arrival.  Likely secondary to poor perfusion from severe systolic CHF. -No evidence of sepsis.  Hypoglycemia already corrected. -Being rewarmed with bear hugger.  Hyperkalemia -Patient reports history of hyperkalemia in the past as well that improved with dietary control. -Home meds include Aldactone.  We will keep it on hold. -Repeat potassium level in the morning.  Severe systolic CHF -He states that he has 15 to 20% EF.  He follows up with cardiologist at [facility].  -On CHF medicines at home that include carvedilol, Lasix, Entresto, Aldactone. -Resume all except Aldactone.  Mobility: Encourage ambulation Code  Status:  Full code  DVT prophylaxis:  Lovenox subcu Antimicrobials:  None Fluid: Not on IV fluids Diet: Cardiac/diabetic diet  Consultants: None Family Communication:  None at bedside Dispo: The patient is from: Home              Anticipated d/c is to: Home              Anticipated d/c date is: 1 day  ------------------------------------------------------------------------------------- Severity of Illness: The appropriate patient status for this patient is OBSERVATION. Observation status is judged to be reasonable and necessary in order to provide the required intensity of service to ensure the patient's safety. The patient's presenting symptoms, physical exam findings, and initial radiographic and laboratory data in the context of their medical condition is felt to place them at decreased risk for further clinical deterioration. Furthermore, it is anticipated that the patient will be medically stable for discharge from the hospital within 2 midnights of admission. The following factors support the patient status of observation.   " The patient's presenting symptoms include hypoglycemia, hypothermia. " The physical exam findings include hypothermia. " The initial radiographic and laboratory data are hyperkalemia. ------------------------------------------------------------------------------------- Signed, Terrilee Croak, MD [facility] Pager: [phone] (Secure Chat preferred). 10/01/2019

## 2019-10-01 NOTE — ED Triage Notes (Deleted)
Per pt, states she took 1st covid shot  1 week ago-states she has been having CP when she coughs, fatigue, headache and back pain since shot-states her whole family had covid in January

## 2019-10-01 NOTE — Plan of Care (Signed)

## 2019-10-02 DIAGNOSIS — E875 Hyperkalemia: Secondary | ICD-10-CM | POA: Diagnosis not present

## 2019-10-02 LAB — CBC WITH DIFFERENTIAL/PLATELET
Abs Immature Granulocytes: 0.01 10*3/uL (ref 0.00–0.07)
Basophils Absolute: 0 10*3/uL (ref 0.0–0.1)
Basophils Relative: 1 %
Eosinophils Absolute: 0.1 10*3/uL (ref 0.0–0.5)
Eosinophils Relative: 1 %
HCT: 32.5 % — ABNORMAL LOW (ref 39.0–52.0)
Hemoglobin: 9.9 g/dL — ABNORMAL LOW (ref 13.0–17.0)
Immature Granulocytes: 0 %
Lymphocytes Relative: 43 %
Lymphs Abs: 2.8 10*3/uL (ref 0.7–4.0)
MCH: 22.1 pg — ABNORMAL LOW (ref 26.0–34.0)
MCHC: 30.5 g/dL (ref 30.0–36.0)
MCV: 72.5 fL — ABNORMAL LOW (ref 80.0–100.0)
Monocytes Absolute: 0.6 10*3/uL (ref 0.1–1.0)
Monocytes Relative: 9 %
Neutro Abs: 3.1 10*3/uL (ref 1.7–7.7)
Neutrophils Relative %: 46 %
Platelets: 271 10*3/uL (ref 150–400)
RBC: 4.48 MIL/uL (ref 4.22–5.81)
RDW: 15.5 % (ref 11.5–15.5)
WBC: 6.6 10*3/uL (ref 4.0–10.5)
nRBC: 0 % (ref 0.0–0.2)

## 2019-10-02 LAB — BASIC METABOLIC PANEL
Anion gap: 8 (ref 5–15)
BUN: 42 mg/dL — ABNORMAL HIGH (ref 6–20)
CO2: 25 mmol/L (ref 22–32)
Calcium: 8.9 mg/dL (ref 8.9–10.3)
Chloride: 107 mmol/L (ref 98–111)
Creatinine, Ser: 1.4 mg/dL — ABNORMAL HIGH (ref 0.61–1.24)
GFR calc Af Amer: >60 mL/min (ref >60)
GFR calc non Af Amer: 59 mL/min — ABNORMAL LOW (ref >60)
Glucose, Bld: 60 mg/dL — ABNORMAL LOW (ref 70–99)
Potassium: 4.6 mmol/L (ref 3.5–5.1)
Sodium: 140 mmol/L (ref 135–145)

## 2019-10-02 LAB — HIV ANTIBODY (ROUTINE TESTING W REFLEX): HIV Screen 4th Generation wRfx: NONREACTIVE

## 2019-10-02 LAB — GLUCOSE, CAPILLARY
Glucose-Capillary: 70 mg/dL (ref 70–99)
Glucose-Capillary: 99 mg/dL (ref 70–99)

## 2019-10-02 LAB — MAGNESIUM: Magnesium: 2 mg/dL (ref 1.7–2.4)

## 2019-10-02 MED ORDER — FUROSEMIDE 20 MG PO TABS: 20.0000 mg | ORAL_TABLET | Freq: Every day | ORAL | Status: DC

## 2019-10-02 MED ORDER — FUROSEMIDE 20 MG PO TABS
20.0000 mg | ORAL_TABLET | Freq: Every day | ORAL | Status: DC
  Administered 2019-10-02: 20 mg via ORAL
  Filled 2019-10-02: qty 1

## 2019-10-02 NOTE — Progress Notes (Signed)
AVS given to patient and explained at the bedside. Medications and follow up appointments have been explained with pt verbalizing understanding.  

## 2019-10-02 NOTE — Progress Notes (Signed)
Inpatient Diabetes Program Recommendations  AACE/ADA: New Consensus Statement on Inpatient Glycemic Control (2015)  Target Ranges:  Prepandial:   less than 140 mg/dL      Peak postprandial:   less than 180 mg/dL (1-2 hours)      Critically ill patients:  140 - 180 mg/dL   Lab Results  Component Value Date   GLUCAP 99 10/02/2019   HGBA1C 9.2 (H) 10/01/2019    Review of Glycemic Control  Diabetes history: DM2 Outpatient Diabetes medications: Novolin 70/30 35 units bid (prescribed 25 units bid) metformin 1000 mg bid (not taking), glipizide 2.5 mg before breakfast (not taking) Current orders for Inpatient glycemic control: Novolog 70/30 20 units bid, Novolog 0-9 units tidwc and hs  HgbA1C - 9.2%  Spoke with pt about his diabetes and HgbA1C of 9.2%. Pt states he has appt with Endo in mid-July. Has been titrating his 70/30 insulin. Had taken too much insulin prior to admission. (2 extra doses of 70/30)  Inpatient Diabetes Program Recommendations:     70/30 25 units bid Continue checking blood sugars 3-4x/day and take logbook to PCP for review and possible adjustments to insulin regimen.  Endo appt for initial assessment - mid July per pt  Long discussion about how insulin works, long-acting vs rapid acting, and mixed insulin such as 70/30. Pt willing to take 4 shots/day with basal and bolus insulin at mealtime. Would recommend starting Lantus 36 units Q24H, Novolog 10 units tid for starting dose.   Will likely stay on 70/30 insulin until pt can f/u with PCP next week. Pt very motivated and wants to control his blood sugars.  Discussed with MD.  Thank you. Ailene Ards, RD, LDN, CDE Inpatient Diabetes Coordinator [phone]

## 2019-10-02 NOTE — Discharge Summary (Signed)
Physician Discharge Summary  Steven Cochran IRC:[id number] DOB: 1971/01/22 DOA: 10/01/2019  PCP: Patient, No Pcp Per  Admit date: 10/01/2019 Discharge date: 10/02/2019  Admitted From: Home Disposition: Home   Recommendations for Outpatient Follow-up:  1. Follow up with cardiology, Dr. Philbert Riser, as scheduled 6/2. Due to hyperkalemia and SCr elevation, holding spironolactone and replacing with daily lasix until follow up. 2. Please obtain BMP at follow up. 3. Monitor hgb at follow up as well (9.9g/dl at discharge without bleeding) 4. Follow up with [name], ANP for insulin management and diabetes education. Note plans for endocrinology referral as outpatient. Presented with hypoglycemia due to taking more insulin than directed, specifically the patient has been taking 70/30 insulin based on gestalt sliding scale based on elevation of postprandial CBGs. HbA1c is 9.2%.  Home Health: None Equipment/Devices: None Discharge Condition: Stable CODE STATUS: Full Diet recommendation: Carb-modified, heart healthy  Brief/Interim Summary: Steven Cochran is a 49 y.o. male with a history of chronic HFrEF (EF 15-20%), IDT2DM, HTN, and obesity who presented to the ED early 5/27 with hypoglycemia after having taken extra insulin for hyperglycemia the previous evening. He felt weak and confused but called EMS who found CBG to be in 30's, improved w/po glucose. If the ED he was hypothermic without other evidence of sepsis which improved with bair hugger rewarming. Labs revealed glucose up to 169, SCr 1.23, K 6.5 for which insulin was given with improvement to 5.5. Admission was requested to due persistence of hyperkalemia. Spironolactone was held and potassium has normalized. His symptoms are resolved and blood sugar is 99 on the day of discharge. SCr 1.40 and K 4.6. He is beginning to get swelling in legs, though has no dyspnea, hypoxemia, or pulmonary edema on CXR. Diabetes coordinator met with the patient for  further education. He is discharged in stable condition with plans to follow up with cardiology with repeat BMP in less than a week.  Discharge Diagnoses:  Active Problems:   Hyperkalemia  Hyperkalemia: Could be related to spironolactone, entresto, and diet heavy in K-rich foods.  - Hold spironolactone, replace with lasix for diuretic due to CHF.  - Continue entresto (K normalized at DC) - Avoid potassium supplementation, etc. until follow up when BMP should be checked.  IDT2DM uncontrolled with hypoglycemia, though chronically hyperglycemic based on HbA1c 9.2%.  - Restart  insulin 70/30 25 units bid and follow up with PCP within the next 1-2 weeks.  - Diabetes coordinator helped with education efforts while the patient was in the hospital. Specifically told him not to use sliding scale for 70/30 insulin and to take this only with meals and certainly not at bedtime.  - Continue checking blood sugars 3-4x/day and take logbook to PCP for review and possible adjustments to insulin regimen. Long discussion about how insulin works, long-acting vs rapid acting, and mixed insulin such as 70/30. - Recommend consideration of dividing insulin into basal + bolus regimen as pt has [employer] and is eager to improve glycemic control. Would recommend starting Lantus 36 units Q24H, Novolog 10 units tid for starting dose. This was not started due to no definite follow up at time of discharge and need for continued education. - Hypoglycemic action plan is understood by patient.  Chronic HFrEF:  - Follow up with cardiology 6/2 as planned. Appears euvolemic at this time. Substitute scheduled lasix (instead of prn) for spironolactone due to hyperkalemia now and in the past.  - Continue entresto and coreg.  Hypothermia: Related to hypoglycemia as  this has resolved. TSH and T4 wnl, not hypotensive, no evidence of active infection.   Obesity: Body mass index is 31.37 kg/m.   Discharge Instructions Discharge  Instructions    Diet - low sodium heart healthy   Complete by: As directed    Discharge instructions   Complete by: As directed    Continue taking 70/30 insulin 25 units twice daily with meals. Do not take if not eating a meal, this will avoid hypoglycemia from the fasting-acting component of the 70/30 insulin.   Stop taking spironolactone for now and instead take lasix once daily until you follow up with your cardiologist.   It is very important for you to check your blood sugar 4 times per day, record these values and present them to your PCP at follow up preferably within the next 1-2 weeks.  If your blood sugar drops below 70, follow the action plan for hypoglycemia which includes eating carbohydrates/sugars and do not take more insulin. Call your doctor right away.   Increase activity slowly   Complete by: As directed      Allergies as of 10/02/2019      Reactions   Metformin And Related Diarrhea      Medication List    STOP taking these medications   amLODipine 5 MG tablet Commonly known as: NORVASC   glipiZIDE 5 MG tablet Commonly known as: GLUCOTROL   lisinopril 10 MG tablet Commonly known as: ZESTRIL   metFORMIN 1000 MG tablet Commonly known as: GLUCOPHAGE   spironolactone 25 MG tablet Commonly known as: ALDACTONE     TAKE these medications   carvedilol 25 MG tablet Commonly known as: COREG Take 25 mg by mouth 2 (two) times daily.   Entresto 49-51 MG Generic drug: sacubitril-valsartan Take 1 tablet by mouth 2 (two) times daily.   furosemide 20 MG tablet Commonly known as: LASIX Take 1 tablet (20 mg total) by mouth daily. What changed:   when to take this  reasons to take this   NovoLIN 70/30 ReliOn (70-30) 100 UNIT/ML injection Generic drug: insulin NPH-regular Human Inject 25 Units into the skin 2 (two) times daily with a meal.   Refresh 1.4-0.6 % Soln Generic drug: Polyvinyl Alcohol-Povidone PF Apply 1 drop to eye daily as needed (dry eyes).       Follow-up Information    [name], NP. Schedule an appointment as soon as possible for a visit.   Specialty: Internal Medicine Contact information: [place] [address] [phone]        Alphia Moh, MD Follow up.   Specialty: Internal Medicine Contact information: [place] [address] [phone]          Allergies  Allergen Reactions  . Metformin And Related Diarrhea    Consultations:  None  Procedures/Studies: DG Chest Portable 1 View  Result Date: 10/01/2019 CLINICAL DATA:  Hypothermia EXAM: PORTABLE CHEST 1 VIEW COMPARISON:  10/15/2018 FINDINGS: Mild cardiomegaly. Both lungs are clear. The visualized skeletal structures are unremarkable. IMPRESSION: Mild cardiomegaly in AP portable projection without acute abnormality of the lungs. Electronically Signed   By: Eddie Candle M.D.   On: 10/01/2019 14:40      Subjective: Feels well, no chest pain, dyspnea. Having some leg swelling, hasn't requested lasix yet. Usually wears compression stockings as well. No dizziness (had this initially). Eating and moving at baseline. Wants to speak to diabetes coordinator prior to discharge.   Discharge Exam:  Vitals:   10/02/19 0540 10/02/19 1001  BP: 140/81 132/76  Pulse:  77  Resp:  12  Temp:  98.6 F (37 C)  SpO2:  100%   General: Pt is alert, awake, not in acute distress Cardiovascular: RRR, S1/S2 +, no rubs, no gallops Respiratory: CTA bilaterally, no wheezing, no rhonchi Abdominal: Soft, NT, ND, bowel sounds + Extremities: Trace dependent edema, no cyanosis  Labs: BNP (last 3 results) Recent Labs    10/10/18 1205  BNP 18.5   Basic Metabolic Panel: Recent Labs  Lab 10/01/19 1129 10/01/19 1351 10/02/19 0402  NA 134* 137 140  K 6.5* 5.5* 4.6  CL 104 106 107  CO2 '23 24 25  '$ GLUCOSE 169* 114* 60*  BUN 43* 41* 42*  CREATININE 1.23 1.10 1.40*  CALCIUM 9.1 9.1 8.9  MG  --    --  2.0   Liver Function Tests: No results for input(s): AST, ALT, ALKPHOS, BILITOT, PROT, ALBUMIN in the last 168 hours. No results for input(s): LIPASE, AMYLASE in the last 168 hours. No results for input(s): AMMONIA in the last 168 hours. CBC: Recent Labs  Lab 10/01/19 1129 10/02/19 0402  WBC 7.7 6.6  NEUTROABS  --  3.1  HGB 11.9* 9.9*  HCT 38.9* 32.5*  MCV 72.6* 72.5*  PLT 298 271   Cardiac Enzymes: No results for input(s): CKTOTAL, CKMB, CKMBINDEX, TROPONINI in the last 168 hours. BNP: Invalid input(s): POCBNP CBG: Recent Labs  Lab 10/01/19 1334 10/01/19 1823 10/01/19 2212 10/02/19 0733 10/02/19 1126  GLUCAP 119* 88 161* 70 99   D-Dimer No results for input(s): DDIMER in the last 72 hours. Hgb A1c Recent Labs    10/01/19 1129  HGBA1C 9.2*   Lipid Profile No results for input(s): CHOL, HDL, LDLCALC, TRIG, CHOLHDL, LDLDIRECT in the last 72 hours. Thyroid function studies Recent Labs    10/01/19 1352  TSH 0.884   Anemia work up No results for input(s): VITAMINB12, FOLATE, FERRITIN, TIBC, IRON, RETICCTPCT in the last 72 hours. Urinalysis    Component Value Date/Time   COLORURINE YELLOW 10/15/2018 2002   APPEARANCEUR CLOUDY (A) 10/15/2018 2002   LABSPEC 1.022 10/15/2018 2002   PHURINE 5.0 10/15/2018 2002   GLUCOSEU >=500 (A) 10/15/2018 2002   HGBUR SMALL (A) 10/15/2018 2002   BILIRUBINUR NEGATIVE 10/15/2018 2002   KETONESUR 20 (A) 10/15/2018 2002   PROTEINUR >=300 (A) 10/15/2018 2002   UROBILINOGEN 0.2 06/01/2013 0306   NITRITE NEGATIVE 10/15/2018 2002   LEUKOCYTESUR NEGATIVE 10/15/2018 2002    Microbiology Recent Results (from the past 240 hour(s))  SARS Coronavirus 2 by RT PCR (hospital order, performed in [facility] hospital lab) Nasopharyngeal Nasopharyngeal Swab     Status: None   Collection Time: 10/01/19  8:08 PM   Specimen: Nasopharyngeal Swab  Result Value Ref Range Status   SARS Coronavirus 2 NEGATIVE NEGATIVE Final    Comment:  (NOTE) SARS-CoV-2 target nucleic acids are NOT DETECTED. The SARS-CoV-2 RNA is generally detectable in upper and lower respiratory specimens during the acute phase of infection. The lowest concentration of SARS-CoV-2 viral copies this assay can detect is 250 copies / mL. A negative result does not preclude SARS-CoV-2 infection and should not be used as the sole basis for treatment or other patient management decisions.  A negative result may occur with improper specimen collection / handling, submission of specimen other than nasopharyngeal swab, presence of viral mutation(s) within the areas targeted by this assay, and inadequate number of viral copies (<250  copies / mL). A negative result must be combined with clinical observations, patient history, and epidemiological information. Fact Sheet for Patients:   StrictlyIdeas.no Fact Sheet for Healthcare Providers: BankingDealers.co.za This test is not yet approved or cleared  by the [place] FDA and has been authorized for detection and/or diagnosis of SARS-CoV-2 by FDA under an Emergency Use Authorization (EUA).  This EUA will remain in effect (meaning this test can be used) for the duration of the COVID-19 declaration under Section 564(b)(1) of the Act, 21 U.S.C. section 360bbb-3(b)(1), unless the authorization is terminated or revoked sooner. Performed at [facility], [place] [address] [place], [place] [address]     Time coordinating discharge: Approximately 40 minutes  Patrecia Pour, MD  [facility] 10/02/2019, 1:16 PM

## 2019-10-06 ENCOUNTER — Encounter (HOSPITAL_COMMUNITY): Payer: Self-pay

## 2019-10-06 ENCOUNTER — Other Ambulatory Visit: Payer: Self-pay

## 2019-10-06 DIAGNOSIS — L97411 Non-pressure chronic ulcer of right heel and midfoot limited to breakdown of skin: Secondary | ICD-10-CM | POA: Diagnosis not present

## 2019-10-06 DIAGNOSIS — Z79899 Other long term (current) drug therapy: Secondary | ICD-10-CM | POA: Insufficient documentation

## 2019-10-06 DIAGNOSIS — R600 Localized edema: Secondary | ICD-10-CM | POA: Insufficient documentation

## 2019-10-06 DIAGNOSIS — I110 Hypertensive heart disease with heart failure: Secondary | ICD-10-CM | POA: Diagnosis not present

## 2019-10-06 DIAGNOSIS — Z794 Long term (current) use of insulin: Secondary | ICD-10-CM | POA: Diagnosis not present

## 2019-10-06 DIAGNOSIS — N289 Disorder of kidney and ureter, unspecified: Secondary | ICD-10-CM | POA: Diagnosis not present

## 2019-10-06 DIAGNOSIS — I509 Heart failure, unspecified: Secondary | ICD-10-CM | POA: Insufficient documentation

## 2019-10-06 DIAGNOSIS — E08621 Diabetes mellitus due to underlying condition with foot ulcer: Secondary | ICD-10-CM | POA: Diagnosis not present

## 2019-10-06 DIAGNOSIS — M7989 Other specified soft tissue disorders: Secondary | ICD-10-CM | POA: Diagnosis present

## 2019-10-06 LAB — CBC WITH DIFFERENTIAL/PLATELET
Abs Immature Granulocytes: 0.01 10*3/uL (ref 0.00–0.07)
Basophils Absolute: 0 10*3/uL (ref 0.0–0.1)
Basophils Relative: 0 %
Eosinophils Absolute: 0.2 10*3/uL (ref 0.0–0.5)
Eosinophils Relative: 3 %
HCT: 32.5 % — ABNORMAL LOW (ref 39.0–52.0)
Hemoglobin: 10.1 g/dL — ABNORMAL LOW (ref 13.0–17.0)
Immature Granulocytes: 0 %
Lymphocytes Relative: 43 %
Lymphs Abs: 2.9 10*3/uL (ref 0.7–4.0)
MCH: 22.3 pg — ABNORMAL LOW (ref 26.0–34.0)
MCHC: 31.1 g/dL (ref 30.0–36.0)
MCV: 71.7 fL — ABNORMAL LOW (ref 80.0–100.0)
Monocytes Absolute: 0.6 10*3/uL (ref 0.1–1.0)
Monocytes Relative: 8 %
Neutro Abs: 3.1 10*3/uL (ref 1.7–7.7)
Neutrophils Relative %: 46 %
Platelets: 270 10*3/uL (ref 150–400)
RBC: 4.53 MIL/uL (ref 4.22–5.81)
RDW: 15.2 % (ref 11.5–15.5)
WBC: 6.8 10*3/uL (ref 4.0–10.5)
nRBC: 0 % (ref 0.0–0.2)

## 2019-10-06 LAB — COMPREHENSIVE METABOLIC PANEL
ALT: 39 U/L (ref 0–44)
AST: 28 U/L (ref 15–41)
Albumin: 4 g/dL (ref 3.5–5.0)
Alkaline Phosphatase: 81 U/L (ref 38–126)
Anion gap: 11 (ref 5–15)
BUN: 40 mg/dL — ABNORMAL HIGH (ref 6–20)
CO2: 23 mmol/L (ref 22–32)
Calcium: 9.1 mg/dL (ref 8.9–10.3)
Chloride: 104 mmol/L (ref 98–111)
Creatinine, Ser: 1.47 mg/dL — ABNORMAL HIGH (ref 0.61–1.24)
GFR calc Af Amer: >60 mL/min (ref >60)
GFR calc non Af Amer: 56 mL/min — ABNORMAL LOW (ref >60)
Glucose, Bld: 131 mg/dL — ABNORMAL HIGH (ref 70–99)
Potassium: 4.7 mmol/L (ref 3.5–5.1)
Sodium: 138 mmol/L (ref 135–145)
Total Bilirubin: 0.7 mg/dL (ref 0.3–1.2)
Total Protein: 7.4 g/dL (ref 6.5–8.1)

## 2019-10-06 LAB — LACTIC ACID, PLASMA: Lactic Acid, Venous: 0.8 mmol/L (ref 0.5–1.9)

## 2019-10-06 MED ORDER — SODIUM CHLORIDE 0.9% FLUSH: 3.0000 mL | Freq: Once | INTRAVENOUS | Status: DC

## 2019-10-06 NOTE — ED Notes (Signed)
Blue and dark green in main lab

## 2019-10-06 NOTE — ED Triage Notes (Signed)
Patient arrived with right leg swelling that started this afternoon. Declines any injury. States he has a spot on his heel that hasnt healed and is concerned about infection.

## 2019-10-07 ENCOUNTER — Emergency Department (HOSPITAL_BASED_OUTPATIENT_CLINIC_OR_DEPARTMENT_OTHER)
Admit: 2019-10-07 | Discharge: 2019-10-07 | Disposition: A | Discharge status: Home / Self Care | Payer: Medicaid Other | Attending: Emergency Medicine | Admitting: Emergency Medicine

## 2019-10-07 ENCOUNTER — Emergency Department (HOSPITAL_COMMUNITY)
Admission: EM | Admit: 2019-10-07 | Discharge: 2019-10-07 | Disposition: A | Discharge status: Home / Self Care | Payer: Medicaid Other | Attending: Emergency Medicine | Admitting: Emergency Medicine

## 2019-10-07 DIAGNOSIS — R609 Edema, unspecified: Secondary | ICD-10-CM | POA: Diagnosis not present

## 2019-10-07 DIAGNOSIS — N289 Disorder of kidney and ureter, unspecified: Secondary | ICD-10-CM

## 2019-10-07 DIAGNOSIS — R600 Localized edema: Secondary | ICD-10-CM

## 2019-10-07 DIAGNOSIS — E08621 Diabetes mellitus due to underlying condition with foot ulcer: Secondary | ICD-10-CM

## 2019-10-07 NOTE — ED Notes (Signed)
Pt ambulatory from triage.  Pt declined to change into a gown at this time.

## 2019-10-07 NOTE — ED Provider Notes (Signed)
[facility]-EMERGENCY DEPT Provider Note   CSN: [id number] Arrival date & time: 10/06/19  2117     History Chief Complaint  Patient presents with  . Leg Swelling    Steven Cochran is a 49 y.o. male.  HPI Patient presents with right leg swelling.  Began yesterday.  States he had not been wearing his pressure stockings.  States he tends to have more swelling on his right leg.  States he had a previous infection that required surgery and is worried about that now.  No fevers.  No chest pain or trouble breathing.  He is diabetic and has a history of CHF.  States he has been looked at previously for a blood clot in the leg.  No drainage from the wound on the heel.    Past Medical History:  Diagnosis Date  . CHF (congestive heart failure) (HCC)   . Diabetes mellitus   . Hypertension     Patient Active Problem List   Diagnosis Date Noted  . Hyperkalemia 10/01/2019  . HTN (hypertension) 06/30/2012    History reviewed. No pertinent surgical history.     No family history on file.  Social History   Tobacco Use  . Smoking status: Never Smoker  . Smokeless tobacco: Never Used  Substance Use Topics  . Alcohol use: No  . Drug use: No    Home Medications Prior to Admission medications   Medication Sig Start Date End Date Taking? Authorizing Provider  carvedilol (COREG) 25 MG tablet Take 25 mg by mouth 2 (two) times daily. 09/15/19  Yes [provider]  ENTRESTO 49-51 MG Take 1 tablet by mouth 2 (two) times daily.  06/24/19  Yes [provider]  furosemide (LASIX) 20 MG tablet Take 1 tablet (20 mg total) by mouth daily. 10/02/19  Yes Tyrone Nine, MD  NOVOLIN 70/30 RELION (70-30) 100 UNIT/ML injection Inject 25 Units into the skin 2 (two) times daily with a meal.  09/28/19  Yes [provider]    Allergies    Metformin and related  Review of Systems   Review of Systems  Constitutional: Negative for appetite change and fever.    Respiratory: Negative for shortness of breath.   Cardiovascular: Positive for leg swelling. Negative for chest pain.  Gastrointestinal: Negative for abdominal pain.  Genitourinary: Negative for flank pain.  Musculoskeletal: Negative for gait problem.  Skin: Positive for wound.  Neurological: Negative for weakness.  Psychiatric/Behavioral: Negative for confusion.    Physical Exam Updated Vital Signs BP (!) 177/100 (BP Location: Left Arm)   Pulse 76   Temp 97.8 F (36.6 C) (Oral)   Resp 18   Ht 6\' 3"  (1.905 m)   Wt 113.4 kg   SpO2 100%   BMI 31.25 kg/m   Physical Exam Vitals and nursing note reviewed.  HENT:     Head: Normocephalic.  Cardiovascular:     Rate and Rhythm: Regular rhythm.  Pulmonary:     Breath sounds: No wheezing or rales.  Abdominal:     Tenderness: There is no abdominal tenderness.  Musculoskeletal:     Comments: Pitting mid right lower leg.  Deformity of the posterior lower leg from previous surgery.  2 small ulcers on heel.  1 posterior and 1 more inferior.  No surrounding erythema.  No drainage.  No real tenderness.  Does have tenderness diffusely over the diffuse edema.  No erythema.  Right leg is not more warm than contralateral.  Skin:  Capillary Refill: Capillary refill takes less than 2 seconds.  Neurological:     Mental Status: He is alert and oriented to person, place, and time.     ED Results / Procedures / Treatments   Labs (all labs ordered are listed, but only abnormal results are displayed) Labs Reviewed  COMPREHENSIVE METABOLIC PANEL - Abnormal; Notable for the following components:      Result Value   Glucose, Bld 131 (*)    BUN 40 (*)    Creatinine, Ser 1.47 (*)    GFR calc non Af Amer 56 (*)    All other components within normal limits  CBC WITH DIFFERENTIAL/PLATELET - Abnormal; Notable for the following components:   Hemoglobin 10.1 (*)    HCT 32.5 (*)    MCV 71.7 (*)    MCH 22.3 (*)    All other components within normal  limits  LACTIC ACID, PLASMA    EKG None  Radiology VAS [place] LOWER EXTREMITY VENOUS (DVT) (ONLY [facility] & [facility])  Result Date: 10/07/2019  Lower Venous DVTStudy Indications: Edema.  Risk Factors: None identified. Comparison Study: No prior studies. Performing Technologist: Oliver Hum RVT  Examination Guidelines: A complete evaluation includes B-mode imaging, spectral Doppler, color Doppler, and power Doppler as needed of all accessible portions of each vessel. Bilateral testing is considered an integral part of a complete examination. Limited examinations for reoccurring indications may be performed as noted. The reflux portion of the exam is performed with the patient in reverse Trendelenburg.  +---------+---------------+---------+-----------+----------+--------------+ RIGHT    CompressibilityPhasicitySpontaneityPropertiesThrombus Aging +---------+---------------+---------+-----------+----------+--------------+ CFV      Full           Yes      Yes                                 +---------+---------------+---------+-----------+----------+--------------+ SFJ      Full                                                        +---------+---------------+---------+-----------+----------+--------------+ FV Prox  Full                                                        +---------+---------------+---------+-----------+----------+--------------+ FV Mid   Full                                                        +---------+---------------+---------+-----------+----------+--------------+ FV DistalFull                                                        +---------+---------------+---------+-----------+----------+--------------+ PFV      Full                                                        +---------+---------------+---------+-----------+----------+--------------+  POP      Full           Yes      Yes                                  +---------+---------------+---------+-----------+----------+--------------+ PTV      Full                                                        +---------+---------------+---------+-----------+----------+--------------+ PERO     Full                                                        +---------+---------------+---------+-----------+----------+--------------+   +----+---------------+---------+-----------+----------+--------------+ LEFTCompressibilityPhasicitySpontaneityPropertiesThrombus Aging +----+---------------+---------+-----------+----------+--------------+ CFV Full           Yes      Yes                                 +----+---------------+---------+-----------+----------+--------------+     Summary: RIGHT: - There is no evidence of deep vein thrombosis in the lower extremity.  - No cystic structure found in the popliteal fossa.  LEFT: - No evidence of common femoral vein obstruction.  *See table(s) above for measurements and observations.    Preliminary     Procedures Procedures (including critical care time)  Medications Ordered in ED Medications - No data to display  ED Course  I have reviewed the triage vital signs and the nursing notes.  Pertinent labs & imaging results that were available during my care of the patient were reviewed by me and considered in my medical decision making (see chart for details).    MDM Rules/Calculators/A&P                      Patient with pain and swelling in his right lower leg.  Worried about DVT due to unilateral swelling but negative Doppler.  Patient is worried about infection due to previous infection on this leg.  There are 2 posterior wounds but they do not appear to be infected at this time.  No surrounding erythema.  Normal white count.  Discharge home and patient structured to continue to wear his pressure stockings.  Outpatient follow-up as needed.  Doubt severe CHF since the swelling is unilateral.  Lungs  clear. Final Clinical Impression(s) / ED Diagnoses Final diagnoses:  Leg edema  Diabetic ulcer of right heel associated with diabetes mellitus due to underlying condition, limited to breakdown of skin [facility])  Renal insufficiency    Rx / DC Orders ED Discharge Orders    None       Benjiman Core, MD 10/07/19 1510

## 2019-10-07 NOTE — Progress Notes (Signed)
Right lower extremity venous duplex has been completed. Preliminary results can be found in CV Proc through chart review.  Results were given to Dr. Rubin Payor.  10/07/19 8:40 AM Olen Cordial RVT

## 2019-10-07 NOTE — Discharge Instructions (Addendum)
Watch for further signs of infection on the foot.  Wear your pressure socks to help with the swelling.  Follow-up with your doctor for the swelling and worsening kidney function.

## 2019-10-07 NOTE — ED Notes (Signed)
Discharge paperwork reviewed with pt.  Pt provided with wound care supplies, per MD request. Ambulatory at time of discharge.

## 2020-03-31 ENCOUNTER — Encounter (HOSPITAL_COMMUNITY): Payer: Self-pay | Admitting: Obstetrics and Gynecology

## 2020-03-31 ENCOUNTER — Emergency Department (HOSPITAL_COMMUNITY)
Admission: EM | Admit: 2020-03-31 | Discharge: 2020-03-31 | Disposition: A | Discharge status: Home / Self Care | Payer: Medicaid Other | Attending: Emergency Medicine | Admitting: Emergency Medicine

## 2020-03-31 ENCOUNTER — Other Ambulatory Visit: Payer: Self-pay

## 2020-03-31 DIAGNOSIS — E119 Type 2 diabetes mellitus without complications: Secondary | ICD-10-CM | POA: Insufficient documentation

## 2020-03-31 DIAGNOSIS — I110 Hypertensive heart disease with heart failure: Secondary | ICD-10-CM | POA: Insufficient documentation

## 2020-03-31 DIAGNOSIS — I509 Heart failure, unspecified: Secondary | ICD-10-CM | POA: Diagnosis not present

## 2020-03-31 DIAGNOSIS — Z794 Long term (current) use of insulin: Secondary | ICD-10-CM | POA: Diagnosis not present

## 2020-03-31 DIAGNOSIS — Z79899 Other long term (current) drug therapy: Secondary | ICD-10-CM | POA: Insufficient documentation

## 2020-03-31 DIAGNOSIS — I10 Essential (primary) hypertension: Secondary | ICD-10-CM

## 2020-03-31 NOTE — Discharge Instructions (Addendum)
Continue taking her usual medication for blood pressure.  Follow-up with your PCP and cardiologist as planned.

## 2020-03-31 NOTE — ED Triage Notes (Signed)
Patient reports he has tried to donate blood x2 the last two days and has been turned away twice due to high BP. Patient reports it was 220/110 yesterday and he is concerned with how high it is. Patient denies N/V/D, Chest pain, SOB, or headaches. Denies vision changes.

## 2020-03-31 NOTE — ED Provider Notes (Signed)
[facility]-EMERGENCY DEPT Provider Note   CSN: [id number] Arrival date & time: 03/31/20  1325     History Chief Complaint  Patient presents with  . Hypertension    Steven Cochran is a 49 y.o. male.  HPI He presents for evaluation of high blood pressure.  He tried to donate plasma, 2 days ago, as well as today, and his blood pressure was over 200, each time.  He restarted his antihypertensive medication yesterday.  He had been off it for several days prior to that.  He denies headache, chest pain, shortness of breath, numbness, weakness, dizziness, gait problems, neck pain or back pain.  He has a history of hypertension and heart failure.  There are no other known modifying factors.    Past Medical History:  Diagnosis Date  . CHF (congestive heart failure) (HCC)   . Diabetes mellitus   . Hypertension     Patient Active Problem List   Diagnosis Date Noted  . Hyperkalemia 10/01/2019  . HTN (hypertension) 06/30/2012    History reviewed. No pertinent surgical history.     No family history on file.  Social History   Tobacco Use  . Smoking status: Never Smoker  . Smokeless tobacco: Never Used  Vaping Use  . Vaping Use: Never used  Substance Use Topics  . Alcohol use: No  . Drug use: No    Home Medications Prior to Admission medications   Medication Sig Start Date End Date Taking? Authorizing Provider  carvedilol (COREG) 25 MG tablet Take 25 mg by mouth 2 (two) times daily. 09/15/19   [provider]  ENTRESTO 49-51 MG Take 1 tablet by mouth 2 (two) times daily.  06/24/19   [provider]  furosemide (LASIX) 20 MG tablet Take 1 tablet (20 mg total) by mouth daily. 10/02/19   Tyrone Nine, MD  NOVOLIN 70/30 RELION (70-30) 100 UNIT/ML injection Inject 25 Units into the skin 2 (two) times daily with a meal.  09/28/19   [provider]    Allergies    Metformin and related  Review of Systems   Review of Systems  All  other systems reviewed and are negative.   Physical Exam Updated Vital Signs BP (!) 168/101 (BP Location: Right Arm)   Pulse 76   Temp 98.6 F (37 C) (Oral)   Resp 11   SpO2 100%   Physical Exam Vitals and nursing note reviewed.  Constitutional:      General: He is not in acute distress.    Appearance: He is well-developed. He is not ill-appearing, toxic-appearing or diaphoretic.  HENT:     Head: Normocephalic and atraumatic.     Right Ear: External ear normal.     Left Ear: External ear normal.  Eyes:     Conjunctiva/sclera: Conjunctivae normal.     Pupils: Pupils are equal, round, and reactive to light.  Neck:     Trachea: Phonation normal.  Cardiovascular:     Rate and Rhythm: Normal rate and regular rhythm.     Heart sounds: Normal heart sounds.  Pulmonary:     Effort: Pulmonary effort is normal.     Breath sounds: Normal breath sounds.  Abdominal:     General: There is no distension.     Palpations: Abdomen is soft.  Musculoskeletal:        General: Normal range of motion.     Cervical back: Normal range of motion and neck supple.  Skin:  General: Skin is warm and dry.  Neurological:     Mental Status: He is alert and oriented to person, place, and time.     Cranial Nerves: No cranial nerve deficit.     Sensory: No sensory deficit.     Motor: No abnormal muscle tone.     Coordination: Coordination normal.     Comments: No dysarthria or aphasia.  Psychiatric:        Mood and Affect: Mood normal.        Behavior: Behavior normal.        Thought Content: Thought content normal.        Judgment: Judgment normal.     ED Results / Procedures / Treatments   Labs (all labs ordered are listed, but only abnormal results are displayed) Labs Reviewed - No data to display  EKG None  Radiology No results found.  Procedures Procedures (including critical care time)  Medications Ordered in ED Medications - No data to display  ED Course  I have reviewed  the triage vital signs and the nursing notes.  Pertinent labs & imaging results that were available during my care of the patient were reviewed by me and considered in my medical decision making (see chart for details).    MDM Rules/Calculators/A&P                           Patient Vitals for the past 24 hrs:  BP Temp Temp src Pulse Resp SpO2  03/31/20 1339 -- 98.6 F (37 C) Oral -- -- --  03/31/20 1339 -- -- -- 76 11 100 %  03/31/20 1338 (!) 168/101 98.6 F (37 C) Oral 76 10 100 %    1:51 PM Reevaluation with update and discussion. After initial assessment and treatment, an updated evaluation reveals blood pressure at this time 153/94.  He remains asymptomatic.  Findings discussed with the patient and all questions were answered. Mancel Bale   Medical Decision Making:  This patient is presenting for evaluation of high blood pressure, which does require a range of treatment options, and is a complaint that involves a moderate risk of morbidity and mortality. The differential diagnoses include uncontrolled blood pressure, elevated blood pressure secondary to not taking medications. I decided to review old records, and in summary blood pressure high recently associated with not taking medicine.  I did not require additional historical information from anyone.  Critical Interventions-clinical evaluation, blood pressure checked several times, discussion with patient  After These Interventions, the Patient was reevaluated and was found stable for discharge.  Patient blood pressure is controlled and no clinical evidence for hypertensive urgency.  CRITICAL CARE-no Performed by: Mancel Bale  Nursing Notes Reviewed/ Care Coordinated Applicable Imaging Reviewed Interpretation of Laboratory Data incorporated into ED treatment  The patient appears reasonably screened and/or stabilized for discharge and I doubt any other medical condition or other [facility] requiring further screening,  evaluation, or treatment in the ED at this time prior to discharge.  Plan: Home Medications-continue usual; Home Treatments-usual diet; return here if the recommended treatment, does not improve the symptoms; Recommended follow up-cardiology and PCP as needed     Final Clinical Impression(s) / ED Diagnoses Final diagnoses:  Hypertension, unspecified type    Rx / DC Orders ED Discharge Orders    None       Mancel Bale, MD 03/31/20 1353

## 2020-09-16 ENCOUNTER — Emergency Department (HOSPITAL_BASED_OUTPATIENT_CLINIC_OR_DEPARTMENT_OTHER): Payer: Managed Care, Other (non HMO)

## 2020-09-16 ENCOUNTER — Emergency Department (HOSPITAL_COMMUNITY)
Admission: EM | Admit: 2020-09-16 | Discharge: 2020-09-16 | Disposition: A | Discharge status: Home / Self Care | Payer: Managed Care, Other (non HMO) | Attending: Emergency Medicine | Admitting: Emergency Medicine

## 2020-09-16 ENCOUNTER — Emergency Department (HOSPITAL_COMMUNITY): Payer: Managed Care, Other (non HMO)

## 2020-09-16 ENCOUNTER — Other Ambulatory Visit: Payer: Self-pay

## 2020-09-16 ENCOUNTER — Encounter (HOSPITAL_COMMUNITY): Payer: Self-pay

## 2020-09-16 DIAGNOSIS — E119 Type 2 diabetes mellitus without complications: Secondary | ICD-10-CM | POA: Diagnosis not present

## 2020-09-16 DIAGNOSIS — S91101A Unspecified open wound of right great toe without damage to nail, initial encounter: Secondary | ICD-10-CM | POA: Insufficient documentation

## 2020-09-16 DIAGNOSIS — I110 Hypertensive heart disease with heart failure: Secondary | ICD-10-CM | POA: Diagnosis not present

## 2020-09-16 DIAGNOSIS — Z794 Long term (current) use of insulin: Secondary | ICD-10-CM | POA: Diagnosis not present

## 2020-09-16 DIAGNOSIS — S99921A Unspecified injury of right foot, initial encounter: Secondary | ICD-10-CM | POA: Diagnosis present

## 2020-09-16 DIAGNOSIS — R609 Edema, unspecified: Secondary | ICD-10-CM

## 2020-09-16 DIAGNOSIS — S91109A Unspecified open wound of unspecified toe(s) without damage to nail, initial encounter: Secondary | ICD-10-CM

## 2020-09-16 DIAGNOSIS — R600 Localized edema: Secondary | ICD-10-CM | POA: Diagnosis not present

## 2020-09-16 DIAGNOSIS — I509 Heart failure, unspecified: Secondary | ICD-10-CM | POA: Diagnosis not present

## 2020-09-16 DIAGNOSIS — X58XXXA Exposure to other specified factors, initial encounter: Secondary | ICD-10-CM | POA: Diagnosis not present

## 2020-09-16 DIAGNOSIS — Z8679 Personal history of other diseases of the circulatory system: Secondary | ICD-10-CM

## 2020-09-16 LAB — COMPREHENSIVE METABOLIC PANEL
ALT: 20 U/L (ref 0–44)
AST: 20 U/L (ref 15–41)
Albumin: 3.5 g/dL (ref 3.5–5.0)
Alkaline Phosphatase: 98 U/L (ref 38–126)
Anion gap: 6 (ref 5–15)
BUN: 20 mg/dL (ref 6–20)
CO2: 28 mmol/L (ref 22–32)
Calcium: 8.9 mg/dL (ref 8.9–10.3)
Chloride: 103 mmol/L (ref 98–111)
Creatinine, Ser: 1.2 mg/dL (ref 0.61–1.24)
GFR, Estimated: >60 mL/min (ref >60)
Glucose, Bld: 209 mg/dL — ABNORMAL HIGH (ref 70–99)
Potassium: 4.2 mmol/L (ref 3.5–5.1)
Sodium: 137 mmol/L (ref 135–145)
Total Bilirubin: 0.3 mg/dL (ref 0.3–1.2)
Total Protein: 7 g/dL (ref 6.5–8.1)

## 2020-09-16 LAB — CBC WITH DIFFERENTIAL/PLATELET
Abs Immature Granulocytes: 0.01 10*3/uL (ref 0.00–0.07)
Basophils Absolute: 0 10*3/uL (ref 0.0–0.1)
Basophils Relative: 1 %
Eosinophils Absolute: 0.3 10*3/uL (ref 0.0–0.5)
Eosinophils Relative: 5 %
HCT: 33.1 % — ABNORMAL LOW (ref 39.0–52.0)
Hemoglobin: 10.2 g/dL — ABNORMAL LOW (ref 13.0–17.0)
Immature Granulocytes: 0 %
Lymphocytes Relative: 41 %
Lymphs Abs: 2.5 10*3/uL (ref 0.7–4.0)
MCH: 22.3 pg — ABNORMAL LOW (ref 26.0–34.0)
MCHC: 30.8 g/dL (ref 30.0–36.0)
MCV: 72.3 fL — ABNORMAL LOW (ref 80.0–100.0)
Monocytes Absolute: 0.5 10*3/uL (ref 0.1–1.0)
Monocytes Relative: 8 %
Neutro Abs: 2.8 10*3/uL (ref 1.7–7.7)
Neutrophils Relative %: 45 %
Platelets: 262 10*3/uL (ref 150–400)
RBC: 4.58 MIL/uL (ref 4.22–5.81)
RDW: 15 % (ref 11.5–15.5)
WBC: 6.1 10*3/uL (ref 4.0–10.5)
nRBC: 0 % (ref 0.0–0.2)

## 2020-09-16 LAB — PROTIME-INR
INR: 0.9 (ref 0.8–1.2)
Prothrombin Time: 12.4 seconds (ref 11.4–15.2)

## 2020-09-16 LAB — APTT: aPTT: 31 seconds (ref 24–36)

## 2020-09-16 LAB — BRAIN NATRIURETIC PEPTIDE: B Natriuretic Peptide: 74.1 pg/mL (ref 0.0–100.0)

## 2020-09-16 LAB — LACTIC ACID, PLASMA: Lactic Acid, Venous: 0.8 mmol/L (ref 0.5–1.9)

## 2020-09-16 MED ORDER — AMOXICILLIN-POT CLAVULANATE 875-125 MG PO TABS: 1.0000 | ORAL_TABLET | Freq: Two times a day (BID) | ORAL | 0 refills | Status: DC

## 2020-09-16 MED ORDER — FUROSEMIDE 40 MG PO TABS: 40.0000 mg | ORAL_TABLET | Freq: Every day | ORAL | 0 refills | Status: DC
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
## 2020-09-16 NOTE — ED Provider Notes (Signed)
Patient seen after prior ED provider  Patient understands plan of care for outpatient treatment.  Patient is aware of his ED study results today.  Importance of close FU is stressed.   Strict return return precautions given and understood.      Wynetta Fines, MD 09/16/20 [phone]

## 2020-09-16 NOTE — ED Notes (Signed)
IV removed from left hand at this time

## 2020-09-16 NOTE — Progress Notes (Signed)
BLE venous duplex has been completed.  Preliminary results given to Steven Cochran, [place].  Results can be found under chart review under CV PROC. 09/16/2020 9:05 AM Cherica Heiden RVT, RDMS
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
## 2020-09-21 LAB — CULTURE, BLOOD (SINGLE): Culture: NO GROWTH

## 2020-12-22 ENCOUNTER — Emergency Department (HOSPITAL_COMMUNITY)
Admission: EM | Admit: 2020-12-22 | Discharge: 2020-12-22 | Disposition: A | Discharge status: Home / Self Care | Payer: Managed Care, Other (non HMO) | Attending: Emergency Medicine | Admitting: Emergency Medicine

## 2020-12-22 ENCOUNTER — Encounter (HOSPITAL_COMMUNITY): Payer: Self-pay | Admitting: Emergency Medicine

## 2020-12-22 ENCOUNTER — Other Ambulatory Visit: Payer: Self-pay

## 2020-12-22 DIAGNOSIS — Z79899 Other long term (current) drug therapy: Secondary | ICD-10-CM | POA: Diagnosis not present

## 2020-12-22 DIAGNOSIS — X110XXA Contact with hot water in bath or tub, initial encounter: Secondary | ICD-10-CM | POA: Diagnosis not present

## 2020-12-22 DIAGNOSIS — Z794 Long term (current) use of insulin: Secondary | ICD-10-CM | POA: Insufficient documentation

## 2020-12-22 DIAGNOSIS — I509 Heart failure, unspecified: Secondary | ICD-10-CM | POA: Diagnosis not present

## 2020-12-22 DIAGNOSIS — T25221A Burn of second degree of right foot, initial encounter: Secondary | ICD-10-CM | POA: Insufficient documentation

## 2020-12-22 DIAGNOSIS — E1140 Type 2 diabetes mellitus with diabetic neuropathy, unspecified: Secondary | ICD-10-CM | POA: Insufficient documentation

## 2020-12-22 DIAGNOSIS — T25021A Burn of unspecified degree of right foot, initial encounter: Secondary | ICD-10-CM | POA: Diagnosis present

## 2020-12-22 DIAGNOSIS — I110 Hypertensive heart disease with heart failure: Secondary | ICD-10-CM | POA: Diagnosis not present

## 2020-12-22 DIAGNOSIS — T300 Burn of unspecified body region, unspecified degree: Secondary | ICD-10-CM

## 2020-12-22 MED ORDER — SILVER SULFADIAZINE 1 % EX CREA
TOPICAL_CREAM | Freq: Once | CUTANEOUS | Status: AC
  Filled 2020-12-22: qty 50

## 2020-12-22 MED ORDER — BACITRACIN ZINC 500 UNIT/GM EX OINT: 1.0000 "application " | TOPICAL_OINTMENT | Freq: Two times a day (BID) | CUTANEOUS | 0 refills | Status: DC

## 2020-12-22 MED ORDER — SILVER SULFADIAZINE 1 % EX CREA: 1.0000 "application " | TOPICAL_CREAM | Freq: Every day | CUTANEOUS | 0 refills | Status: DC
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
## 2020-12-23 ENCOUNTER — Emergency Department (HOSPITAL_COMMUNITY): Payer: Managed Care, Other (non HMO)

## 2020-12-23 ENCOUNTER — Encounter (HOSPITAL_COMMUNITY): Payer: Self-pay | Admitting: Emergency Medicine

## 2020-12-23 ENCOUNTER — Emergency Department (HOSPITAL_COMMUNITY)
Admission: EM | Admit: 2020-12-23 | Discharge: 2020-12-23 | Disposition: A | Discharge status: Home / Self Care | Payer: Managed Care, Other (non HMO) | Attending: Emergency Medicine | Admitting: Emergency Medicine

## 2020-12-23 ENCOUNTER — Emergency Department (HOSPITAL_BASED_OUTPATIENT_CLINIC_OR_DEPARTMENT_OTHER): Payer: Managed Care, Other (non HMO)

## 2020-12-23 DIAGNOSIS — Z79899 Other long term (current) drug therapy: Secondary | ICD-10-CM | POA: Insufficient documentation

## 2020-12-23 DIAGNOSIS — Z794 Long term (current) use of insulin: Secondary | ICD-10-CM | POA: Diagnosis not present

## 2020-12-23 DIAGNOSIS — M7989 Other specified soft tissue disorders: Secondary | ICD-10-CM

## 2020-12-23 DIAGNOSIS — E1140 Type 2 diabetes mellitus with diabetic neuropathy, unspecified: Secondary | ICD-10-CM | POA: Diagnosis not present

## 2020-12-23 DIAGNOSIS — L03115 Cellulitis of right lower limb: Secondary | ICD-10-CM | POA: Insufficient documentation

## 2020-12-23 DIAGNOSIS — I509 Heart failure, unspecified: Secondary | ICD-10-CM | POA: Insufficient documentation

## 2020-12-23 DIAGNOSIS — R609 Edema, unspecified: Secondary | ICD-10-CM

## 2020-12-23 DIAGNOSIS — I110 Hypertensive heart disease with heart failure: Secondary | ICD-10-CM | POA: Insufficient documentation

## 2020-12-23 DIAGNOSIS — R600 Localized edema: Secondary | ICD-10-CM | POA: Insufficient documentation

## 2020-12-23 DIAGNOSIS — T300 Burn of unspecified body region, unspecified degree: Secondary | ICD-10-CM

## 2020-12-23 LAB — CBC WITH DIFFERENTIAL/PLATELET
Abs Immature Granulocytes: 0.02 10*3/uL (ref 0.00–0.07)
Basophils Absolute: 0 10*3/uL (ref 0.0–0.1)
Basophils Relative: 0 %
Eosinophils Absolute: 0.3 10*3/uL (ref 0.0–0.5)
Eosinophils Relative: 4 %
HCT: 30.1 % — ABNORMAL LOW (ref 39.0–52.0)
Hemoglobin: 9.1 g/dL — ABNORMAL LOW (ref 13.0–17.0)
Immature Granulocytes: 0 %
Lymphocytes Relative: 23 %
Lymphs Abs: 1.8 10*3/uL (ref 0.7–4.0)
MCH: 21.8 pg — ABNORMAL LOW (ref 26.0–34.0)
MCHC: 30.2 g/dL (ref 30.0–36.0)
MCV: 72.2 fL — ABNORMAL LOW (ref 80.0–100.0)
Monocytes Absolute: 0.7 10*3/uL (ref 0.1–1.0)
Monocytes Relative: 9 %
Neutro Abs: 5.1 10*3/uL (ref 1.7–7.7)
Neutrophils Relative %: 64 %
Platelets: 295 10*3/uL (ref 150–400)
RBC: 4.17 MIL/uL — ABNORMAL LOW (ref 4.22–5.81)
RDW: 15 % (ref 11.5–15.5)
WBC: 8 10*3/uL (ref 4.0–10.5)
nRBC: 0 % (ref 0.0–0.2)

## 2020-12-23 LAB — BASIC METABOLIC PANEL
Anion gap: 8 (ref 5–15)
BUN: 25 mg/dL — ABNORMAL HIGH (ref 6–20)
CO2: 24 mmol/L (ref 22–32)
Calcium: 8.9 mg/dL (ref 8.9–10.3)
Chloride: 105 mmol/L (ref 98–111)
Creatinine, Ser: 1.12 mg/dL (ref 0.61–1.24)
GFR, Estimated: >60 mL/min (ref >60)
Glucose, Bld: 174 mg/dL — ABNORMAL HIGH (ref 70–99)
Potassium: 4.5 mmol/L (ref 3.5–5.1)
Sodium: 137 mmol/L (ref 135–145)

## 2020-12-23 LAB — LACTIC ACID, PLASMA: Lactic Acid, Venous: 0.6 mmol/L (ref 0.5–1.9)

## 2020-12-23 MED ORDER — IOHEXOL 350 MG/ML SOLN
100.0000 mL | Freq: Once | INTRAVENOUS | Status: AC | PRN
  Administered 2020-12-23: 80 mL via INTRAVENOUS

## 2020-12-23 MED ORDER — GADOBUTROL 1 MMOL/ML IV SOLN
10.0000 mL | Freq: Once | INTRAVENOUS | Status: AC | PRN
  Administered 2020-12-23: 10 mL via INTRAVENOUS

## 2020-12-23 MED ORDER — FUROSEMIDE 40 MG PO TABS: 40.0000 mg | ORAL_TABLET | Freq: Every day | ORAL | 0 refills | Status: DC

## 2020-12-23 MED ORDER — DOXYCYCLINE HYCLATE 100 MG PO CAPS: 100.0000 mg | ORAL_CAPSULE | Freq: Two times a day (BID) | ORAL | 0 refills | Status: DC
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
## 2020-12-23 NOTE — ED Notes (Signed)
Pt back from MRI 

## 2020-12-23 NOTE — ED Notes (Signed)
Patient transported to CT 

## 2020-12-23 NOTE — Discharge Instructions (Addendum)
Take the Lasix as prescribed  Start the antibiotic  Follow-up with primary care provider within 2 days for wound recheck  Return for new or worsening symptoms

## 2020-12-23 NOTE — ED Triage Notes (Signed)
Patient presents with leg swelling. Patient states he thought he would be given something for the leg swelling while being seen, but states he was not. Patient denies other symptoms, states wants to get swelling looked at.

## 2020-12-23 NOTE — Progress Notes (Signed)
RLE venous duplex has been completed.  Preliminary results given to [facility], [facility].  Results can be found under chart review under CV PROC. 12/23/2020 10:48 AM Javyon Fontan RVT, RDMS

## 2020-12-23 NOTE — ED Notes (Signed)
Pt went to MRI.

## 2021-06-12 DIAGNOSIS — H3589 Other specified retinal disorders: Secondary | ICD-10-CM | POA: Diagnosis not present

## 2021-06-12 DIAGNOSIS — E113513 Type 2 diabetes mellitus with proliferative diabetic retinopathy with macular edema, bilateral: Secondary | ICD-10-CM | POA: Diagnosis not present

## 2021-06-29 DIAGNOSIS — E113511 Type 2 diabetes mellitus with proliferative diabetic retinopathy with macular edema, right eye: Secondary | ICD-10-CM | POA: Diagnosis not present

## 2021-08-30 ENCOUNTER — Encounter (HOSPITAL_COMMUNITY): Payer: Self-pay | Admitting: Oncology

## 2021-08-30 ENCOUNTER — Emergency Department (HOSPITAL_COMMUNITY)
Admission: EM | Admit: 2021-08-30 | Discharge: 2021-08-30 | Disposition: A | Discharge status: Home / Self Care | Payer: Managed Care, Other (non HMO) | Attending: Emergency Medicine | Admitting: Emergency Medicine

## 2021-08-30 ENCOUNTER — Other Ambulatory Visit: Payer: Self-pay

## 2021-08-30 DIAGNOSIS — Z79899 Other long term (current) drug therapy: Secondary | ICD-10-CM | POA: Diagnosis not present

## 2021-08-30 DIAGNOSIS — R519 Headache, unspecified: Secondary | ICD-10-CM | POA: Diagnosis present

## 2021-08-30 DIAGNOSIS — I10 Essential (primary) hypertension: Secondary | ICD-10-CM | POA: Insufficient documentation

## 2021-08-30 DIAGNOSIS — I160 Hypertensive urgency: Secondary | ICD-10-CM | POA: Diagnosis not present

## 2021-08-30 LAB — CBC WITH DIFFERENTIAL/PLATELET
Abs Immature Granulocytes: 0.01 10*3/uL (ref 0.00–0.07)
Basophils Absolute: 0 10*3/uL (ref 0.0–0.1)
Basophils Relative: 1 %
Eosinophils Absolute: 0.1 10*3/uL (ref 0.0–0.5)
Eosinophils Relative: 3 %
HCT: 33.2 % — ABNORMAL LOW (ref 39.0–52.0)
Hemoglobin: 10.1 g/dL — ABNORMAL LOW (ref 13.0–17.0)
Immature Granulocytes: 0 %
Lymphocytes Relative: 36 %
Lymphs Abs: 2 10*3/uL (ref 0.7–4.0)
MCH: 22.2 pg — ABNORMAL LOW (ref 26.0–34.0)
MCHC: 30.4 g/dL (ref 30.0–36.0)
MCV: 73 fL — ABNORMAL LOW (ref 80.0–100.0)
Monocytes Absolute: 0.3 10*3/uL (ref 0.1–1.0)
Monocytes Relative: 6 %
Neutro Abs: 3 10*3/uL (ref 1.7–7.7)
Neutrophils Relative %: 54 %
Platelets: 287 10*3/uL (ref 150–400)
RBC: 4.55 MIL/uL (ref 4.22–5.81)
RDW: 14.4 % (ref 11.5–15.5)
WBC: 5.5 10*3/uL (ref 4.0–10.5)
nRBC: 0 % (ref 0.0–0.2)

## 2021-08-30 LAB — COMPREHENSIVE METABOLIC PANEL
ALT: 17 U/L (ref 0–44)
AST: 18 U/L (ref 15–41)
Albumin: 3.3 g/dL — ABNORMAL LOW (ref 3.5–5.0)
Alkaline Phosphatase: 85 U/L (ref 38–126)
Anion gap: 3 — ABNORMAL LOW (ref 5–15)
BUN: 18 mg/dL (ref 6–20)
CO2: 28 mmol/L (ref 22–32)
Calcium: 8.7 mg/dL — ABNORMAL LOW (ref 8.9–10.3)
Chloride: 107 mmol/L (ref 98–111)
Creatinine, Ser: 1.13 mg/dL (ref 0.61–1.24)
GFR, Estimated: >60 mL/min (ref >60)
Glucose, Bld: 239 mg/dL — ABNORMAL HIGH (ref 70–99)
Potassium: 4.3 mmol/L (ref 3.5–5.1)
Sodium: 138 mmol/L (ref 135–145)
Total Bilirubin: 0.5 mg/dL (ref 0.3–1.2)
Total Protein: 6.4 g/dL — ABNORMAL LOW (ref 6.5–8.1)

## 2021-08-30 MED ORDER — CARVEDILOL 25 MG PO TABS: 25.0000 mg | ORAL_TABLET | Freq: Two times a day (BID) | ORAL | 2 refills | Status: DC

## 2021-08-30 MED ORDER — CARVEDILOL 12.5 MG PO TABS
25.0000 mg | ORAL_TABLET | Freq: Once | ORAL | Status: AC
  Administered 2021-08-30: 25 mg via ORAL
  Filled 2021-08-30: qty 2

## 2021-08-30 MED ORDER — ENTRESTO 49-51 MG PO TABS: 1.0000 | ORAL_TABLET | Freq: Two times a day (BID) | ORAL | 0 refills | Status: DC

## 2021-08-30 MED ORDER — HYDRALAZINE HCL 25 MG PO TABS
50.0000 mg | ORAL_TABLET | Freq: Once | ORAL | Status: AC
  Administered 2021-08-30: 50 mg via ORAL
  Filled 2021-08-30: qty 2

## 2021-08-30 MED ORDER — SACUBITRIL-VALSARTAN 49-51 MG PO TABS
1.0000 | ORAL_TABLET | Freq: Once | ORAL | Status: AC
  Administered 2021-08-30: 1 via ORAL
  Filled 2021-08-30: qty 1

## 2021-08-30 MED ORDER — AMLODIPINE BESYLATE 5 MG PO TABS: 5.0000 mg | ORAL_TABLET | Freq: Every day | ORAL | 2 refills | Status: DC

## 2021-08-30 NOTE — ED Provider Notes (Signed)
?  Physical Exam  ?BP (!) 188/106   Pulse 73   Temp 98.1 ?F (36.7 ?C) (Oral)   Resp 11   Ht 6\' 2"  (1.88 m)   Wt 127 kg   SpO2 100%   BMI 35.95 kg/m?  ? ?Physical Exam ?Vitals and nursing note reviewed.  ?Constitutional:   ?   General: He is not in acute distress. ?   Appearance: He is well-developed.  ?HENT:  ?   Head: Normocephalic and atraumatic.  ?Eyes:  ?   Conjunctiva/sclera: Conjunctivae normal.  ?Cardiovascular:  ?   Rate and Rhythm: Normal rate and regular rhythm.  ?   Heart sounds: No murmur heard. ?Pulmonary:  ?   Effort: Pulmonary effort is normal. No respiratory distress.  ?   Breath sounds: Normal breath sounds.  ?Abdominal:  ?   Palpations: Abdomen is soft.  ?   Tenderness: There is no abdominal tenderness.  ?Musculoskeletal:     ?   General: No swelling.  ?   Cervical back: Neck supple.  ?Skin: ?   General: Skin is warm and dry.  ?   Capillary Refill: Capillary refill takes less than 2 seconds.  ?Neurological:  ?   Mental Status: He is alert.  ?Psychiatric:     ?   Mood and Affect: Mood normal.  ? ? ?Procedures  ?Procedures ? ?ED Course / MDM  ? ?Clinical Course as of 08/30/21 2226  ?Wed Aug 30, 2021  ?1501 If labs normal he can go [MK]  ?  ?Clinical Course User Index ?[MK] Deegan Valentino, Sep 01, 2021, MD  ? ?Medical Decision Making ?Amount and/or Complexity of Data Reviewed ?Labs: ordered. ? ?Risk ?Prescription drug management. ? ? ?Patient received in handoff.  Asymptomatic hypertension.  Pending labs at time of signout.  Laboratory evaluation reassuringly unremarkable with no endorgan damage.  Patient then discharged with PCP follow-up.  He has no evidence of stroke at time of signout.  I personally refilled his hypertension medications and added 5 mg Norvasc. ? ? ? ? ?  ?Wyn Forster, MD ?08/30/21 2226 ? ?

## 2021-08-30 NOTE — ED Triage Notes (Signed)
Pt reports HTN.  Denies associated sx. Pt is not currently taking his medication for HTN.  ?

## 2021-08-30 NOTE — ED Provider Notes (Signed)
?[facility]-EMERGENCY DEPT ?Provider Note ? ? ?CSN: [id number] ?Arrival date & time: 08/30/21  1238 ? ?  ? ?History ? ?Chief Complaint  ?Patient presents with  ? Hypertension  ? ? ?Steven Cochran is a 51 y.o. male. ? ?HPI ?51 year old male presents with hypertension.  States that he went to go enroll in a clinical trial and when they were checking his blood pressure was very high.  He does not member the exact number but it was over 200 systolic.  He has taken himself off of his BP meds and was trying to control his blood pressure with diet and exercise.  After being told his blood pressure is high he did remember that he had a headache a couple nights ago in the middle the night.  Has been having headaches on and off like this throughout the past month or so since he is last been on blood pressure medicine.  No headache now.  Chronic left hand weakness and chronic bilateral lower extremity neuropathy but no other new symptoms.  No vision changes besides chronic left eye blindness.  No chest pain. ? ?Home Medications ?Prior to Admission medications   ?Medication Sig Start Date End Date Taking? Authorizing Provider  ?amoxicillin-clavulanate (AUGMENTIN) 875-125 MG tablet Take 1 tablet by mouth 2 (two) times daily. One po bid x 7 days 09/16/20   Zadie Rhine, MD  ?bacitracin ointment Apply 1 application topically 2 (two) times daily. 12/22/20   Mannie Stabile, PA-C  ?carvedilol (COREG) 25 MG tablet Take 25 mg by mouth 2 (two) times daily. 09/15/19   [provider]  ?CINNAMON PO Take 1 tablet by mouth daily.    [provider]  ?doxycycline (VIBRAMYCIN) 100 MG capsule Take 1 capsule (100 mg total) by mouth 2 (two) times daily. 12/23/20   Henderly, Britni A, PA-C  ?ENTRESTO 49-51 MG Take 1 tablet by mouth 2 (two) times daily.  06/24/19   [provider]  ?furosemide (LASIX) 40 MG tablet Take 1 tablet (40 mg total) by mouth daily for 5 days. 12/23/20 12/28/20  Henderly,  Britni A, PA-C  ?magnesium 30 MG tablet Take 30 mg by mouth daily.    [provider]  ?NOVOLIN 70/30 RELION (70-30) 100 UNIT/ML injection Inject 25 Units into the skin daily as needed (BS). 09/28/19   [provider]  ?silver sulfADIAZINE (SILVADENE) 1 % cream Apply 1 application topically daily. 12/22/20   Mannie Stabile, PA-C  ?TURMERIC PO Take 1 tablet by mouth daily.    [provider]  ?   ? ?Allergies    ?Metformin and related   ? ?Review of Systems   ?Review of Systems  ?Eyes:  Negative for visual disturbance.  ?Respiratory:  Negative for shortness of breath.   ?Cardiovascular:  Negative for chest pain.  ?Neurological:  Positive for headaches. Negative for weakness and numbness.  ? ?Physical Exam ?Updated Vital Signs ?BP (!) 225/125 (BP Location: Left Arm)   Pulse 82   Temp 98.1 ?F (36.7 ?C) (Oral)   Resp 18   Ht 6\' 2"  (1.88 m)   Wt 127 kg   SpO2 100%   BMI 35.95 kg/m?  ?Physical Exam ?Vitals and nursing note reviewed.  ?Constitutional:   ?   General: He is not in acute distress. ?   Appearance: He is well-developed. He is not ill-appearing or diaphoretic.  ?HENT:  ?   Head: Normocephalic and atraumatic.  ?Eyes:  ?   Extraocular Movements: Extraocular  movements intact.  ?   Comments: Chronic left eye changes, is blind in this eye. Right pupil reactive.  ?Cardiovascular:  ?   Rate and Rhythm: Normal rate and regular rhythm.  ?   Heart sounds: Normal heart sounds.  ?Pulmonary:  ?   Effort: Pulmonary effort is normal.  ?   Breath sounds: Normal breath sounds.  ?Abdominal:  ?   Palpations: Abdomen is soft.  ?   Tenderness: There is no abdominal tenderness.  ?Skin: ?   General: Skin is warm and dry.  ?Neurological:  ?   Mental Status: He is alert.  ?   Comments: CN 3-12 grossly intact. 5/5 strength in all 4 extremities save for chronic left hand weakness. Normal finger to nose.   ? ? ?ED Results / Procedures / Treatments   ?Labs ?(all labs ordered are listed, but only abnormal  results are displayed) ?Labs Reviewed  ?COMPREHENSIVE METABOLIC PANEL  ?CBC WITH DIFFERENTIAL/PLATELET  ? ? ?EKG ?EKG Interpretation ? ?Date/Time:  Wednesday August 30 2021 13:34:21 EDT ?Ventricular Rate:  78 ?PR Interval:  204 ?QRS Duration: 102 ?QT Interval:  409 ?QTC Calculation: 466 ?R Axis:   8 ?Text Interpretation: Sinus rhythm Borderline prolonged PR interval Consider left atrial enlargement Left ventricular hypertrophy Nonspecific T abnormalities, lateral leads ST elev, probable normal early repol pattern similar to May 2022 Confirmed by Pricilla Loveless [phone]) on 08/30/2021 2:17:46 PM ? ?Radiology ?No results found. ? ?Procedures ?Procedures  ? ? ?Medications Ordered in ED ?Medications  ?carvedilol (COREG) tablet 25 mg (25 mg Oral Given 08/30/21 1427)  ?sacubitril-valsartan (ENTRESTO) 49-51 mg per tablet (1 tablet Oral Given 08/30/21 1428)  ? ? ?ED Course/ Medical Decision Making/ A&P ? ?                        ?Medical Decision Making ?Amount and/or Complexity of Data Reviewed ?Labs: ordered. ? ?Risk ?Prescription drug management. ? ? ?Patient presents with asymptomatic hypertension.  Chart reviewed, will give his home p.o. meds.  Labs have been obtained and I am awaiting the results.  Given no other emergent findings on exam, if the renal function and other labs are okay I think he can be discharged.  Care transferred to Dr. Posey Rea. ? ? ? ? ? ? ? ?Final Clinical Impression(s) / ED Diagnoses ?Final diagnoses:  ?None  ? ? ?Rx / DC Orders ?ED Discharge Orders   ? ? None  ? ?  ? ? ?  ?Pricilla Loveless, MD ?08/30/21 1512 ? ?

## 2021-09-01 ENCOUNTER — Emergency Department (HOSPITAL_COMMUNITY): Payer: Managed Care, Other (non HMO)

## 2021-09-01 DIAGNOSIS — Z79899 Other long term (current) drug therapy: Secondary | ICD-10-CM | POA: Diagnosis not present

## 2021-09-01 DIAGNOSIS — E113511 Type 2 diabetes mellitus with proliferative diabetic retinopathy with macular edema, right eye: Secondary | ICD-10-CM | POA: Diagnosis not present

## 2021-09-01 DIAGNOSIS — E119 Type 2 diabetes mellitus without complications: Secondary | ICD-10-CM | POA: Diagnosis not present

## 2021-09-01 DIAGNOSIS — G4489 Other headache syndrome: Secondary | ICD-10-CM | POA: Diagnosis not present

## 2021-09-01 DIAGNOSIS — G459 Transient cerebral ischemic attack, unspecified: Secondary | ICD-10-CM | POA: Diagnosis not present

## 2021-09-01 DIAGNOSIS — I639 Cerebral infarction, unspecified: Secondary | ICD-10-CM | POA: Diagnosis not present

## 2021-09-01 DIAGNOSIS — Z794 Long term (current) use of insulin: Secondary | ICD-10-CM | POA: Diagnosis not present

## 2021-09-01 DIAGNOSIS — I110 Hypertensive heart disease with heart failure: Secondary | ICD-10-CM | POA: Diagnosis not present

## 2021-09-01 DIAGNOSIS — I10 Essential (primary) hypertension: Secondary | ICD-10-CM | POA: Diagnosis not present

## 2021-09-01 DIAGNOSIS — I5022 Chronic systolic (congestive) heart failure: Secondary | ICD-10-CM | POA: Diagnosis not present

## 2021-09-01 DIAGNOSIS — R4781 Slurred speech: Secondary | ICD-10-CM | POA: Diagnosis not present

## 2021-09-01 DIAGNOSIS — R29810 Facial weakness: Secondary | ICD-10-CM | POA: Diagnosis not present

## 2021-09-02 ENCOUNTER — Observation Stay (HOSPITAL_COMMUNITY): Payer: Managed Care, Other (non HMO)

## 2021-09-02 ENCOUNTER — Observation Stay (HOSPITAL_BASED_OUTPATIENT_CLINIC_OR_DEPARTMENT_OTHER): Payer: Managed Care, Other (non HMO)

## 2021-09-02 ENCOUNTER — Observation Stay (HOSPITAL_COMMUNITY)
Admission: EM | Admit: 2021-09-02 | Discharge: 2021-09-03 | Disposition: A | Discharge status: Home / Self Care | Payer: Managed Care, Other (non HMO) | Attending: Internal Medicine | Admitting: Internal Medicine

## 2021-09-02 ENCOUNTER — Other Ambulatory Visit: Payer: Self-pay

## 2021-09-02 DIAGNOSIS — I5022 Chronic systolic (congestive) heart failure: Secondary | ICD-10-CM | POA: Insufficient documentation

## 2021-09-02 DIAGNOSIS — I651 Occlusion and stenosis of basilar artery: Secondary | ICD-10-CM | POA: Diagnosis not present

## 2021-09-02 DIAGNOSIS — Z794 Long term (current) use of insulin: Secondary | ICD-10-CM | POA: Insufficient documentation

## 2021-09-02 DIAGNOSIS — I639 Cerebral infarction, unspecified: Secondary | ICD-10-CM

## 2021-09-02 DIAGNOSIS — I6389 Other cerebral infarction: Secondary | ICD-10-CM | POA: Diagnosis not present

## 2021-09-02 DIAGNOSIS — R29810 Facial weakness: Secondary | ICD-10-CM | POA: Diagnosis not present

## 2021-09-02 DIAGNOSIS — G459 Transient cerebral ischemic attack, unspecified: Secondary | ICD-10-CM

## 2021-09-02 DIAGNOSIS — I10 Essential (primary) hypertension: Secondary | ICD-10-CM | POA: Diagnosis not present

## 2021-09-02 DIAGNOSIS — E1159 Type 2 diabetes mellitus with other circulatory complications: Secondary | ICD-10-CM

## 2021-09-02 DIAGNOSIS — R200 Anesthesia of skin: Secondary | ICD-10-CM | POA: Diagnosis not present

## 2021-09-02 DIAGNOSIS — E1149 Type 2 diabetes mellitus with other diabetic neurological complication: Secondary | ICD-10-CM

## 2021-09-02 DIAGNOSIS — I6522 Occlusion and stenosis of left carotid artery: Secondary | ICD-10-CM | POA: Diagnosis not present

## 2021-09-02 DIAGNOSIS — E119 Type 2 diabetes mellitus without complications: Secondary | ICD-10-CM | POA: Insufficient documentation

## 2021-09-02 DIAGNOSIS — K029 Dental caries, unspecified: Secondary | ICD-10-CM | POA: Diagnosis not present

## 2021-09-02 DIAGNOSIS — I110 Hypertensive heart disease with heart failure: Secondary | ICD-10-CM | POA: Insufficient documentation

## 2021-09-02 DIAGNOSIS — M47812 Spondylosis without myelopathy or radiculopathy, cervical region: Secondary | ICD-10-CM | POA: Diagnosis not present

## 2021-09-02 DIAGNOSIS — Z79899 Other long term (current) drug therapy: Secondary | ICD-10-CM | POA: Insufficient documentation

## 2021-09-02 LAB — COMPREHENSIVE METABOLIC PANEL
ALT: 16 U/L (ref 0–44)
AST: 17 U/L (ref 15–41)
Albumin: 3.2 g/dL — ABNORMAL LOW (ref 3.5–5.0)
Alkaline Phosphatase: 94 U/L (ref 38–126)
Anion gap: 8 (ref 5–15)
BUN: 13 mg/dL (ref 6–20)
CO2: 26 mmol/L (ref 22–32)
Calcium: 9 mg/dL (ref 8.9–10.3)
Chloride: 106 mmol/L (ref 98–111)
Creatinine, Ser: 1.4 mg/dL — ABNORMAL HIGH (ref 0.61–1.24)
GFR, Estimated: >60 mL/min (ref >60)
Glucose, Bld: 191 mg/dL — ABNORMAL HIGH (ref 70–99)
Potassium: 4.3 mmol/L (ref 3.5–5.1)
Sodium: 140 mmol/L (ref 135–145)
Total Bilirubin: 0.6 mg/dL (ref 0.3–1.2)
Total Protein: 6.3 g/dL — ABNORMAL LOW (ref 6.5–8.1)

## 2021-09-02 LAB — CBC WITH DIFFERENTIAL/PLATELET
Abs Immature Granulocytes: 0.02 10*3/uL (ref 0.00–0.07)
Basophils Absolute: 0 10*3/uL (ref 0.0–0.1)
Basophils Relative: 1 %
Eosinophils Absolute: 0.1 10*3/uL (ref 0.0–0.5)
Eosinophils Relative: 2 %
HCT: 34.3 % — ABNORMAL LOW (ref 39.0–52.0)
Hemoglobin: 10.5 g/dL — ABNORMAL LOW (ref 13.0–17.0)
Immature Granulocytes: 0 %
Lymphocytes Relative: 42 %
Lymphs Abs: 2.8 10*3/uL (ref 0.7–4.0)
MCH: 22.2 pg — ABNORMAL LOW (ref 26.0–34.0)
MCHC: 30.6 g/dL (ref 30.0–36.0)
MCV: 72.4 fL — ABNORMAL LOW (ref 80.0–100.0)
Monocytes Absolute: 0.4 10*3/uL (ref 0.1–1.0)
Monocytes Relative: 6 %
Neutro Abs: 3.2 10*3/uL (ref 1.7–7.7)
Neutrophils Relative %: 49 %
Platelets: 284 10*3/uL (ref 150–400)
RBC: 4.74 MIL/uL (ref 4.22–5.81)
RDW: 14.5 % (ref 11.5–15.5)
WBC: 6.6 10*3/uL (ref 4.0–10.5)
nRBC: 0 % (ref 0.0–0.2)

## 2021-09-02 LAB — ECHOCARDIOGRAM COMPLETE
AR max vel: 4.16 cm2
AV Area VTI: 4.11 cm2
AV Area mean vel: 3.93 cm2
AV Mean grad: 2 mmHg
AV Peak grad: 3.7 mmHg
Ao pk vel: 0.96 m/s
Calc EF: 36.4 %
Height: 74 in
S' Lateral: 4.4 cm
Single Plane A2C EF: 35.6 %
Single Plane A4C EF: 34.4 %
Weight: 4480 oz

## 2021-09-02 LAB — ETHANOL: Alcohol, Ethyl (B): <10 mg/dL (ref <10)

## 2021-09-02 LAB — I-STAT CHEM 8, ED
BUN: 17 mg/dL (ref 6–20)
Calcium, Ion: 1.08 mmol/L — ABNORMAL LOW (ref 1.15–1.40)
Chloride: 106 mmol/L (ref 98–111)
Creatinine, Ser: 1.4 mg/dL — ABNORMAL HIGH (ref 0.61–1.24)
Glucose, Bld: 190 mg/dL — ABNORMAL HIGH (ref 70–99)
HCT: 34 % — ABNORMAL LOW (ref 39.0–52.0)
Hemoglobin: 11.6 g/dL — ABNORMAL LOW (ref 13.0–17.0)
Potassium: 4.2 mmol/L (ref 3.5–5.1)
Sodium: 140 mmol/L (ref 135–145)
TCO2: 26 mmol/L (ref 22–32)

## 2021-09-02 LAB — PROTIME-INR
INR: 0.9 (ref 0.8–1.2)
Prothrombin Time: 12.3 seconds (ref 11.4–15.2)

## 2021-09-02 LAB — GLUCOSE, CAPILLARY
Glucose-Capillary: 152 mg/dL — ABNORMAL HIGH (ref 70–99)
Glucose-Capillary: 225 mg/dL — ABNORMAL HIGH (ref 70–99)

## 2021-09-02 LAB — HIV ANTIBODY (ROUTINE TESTING W REFLEX): HIV Screen 4th Generation wRfx: NONREACTIVE

## 2021-09-02 LAB — HEMOGLOBIN A1C
Hgb A1c MFr Bld: 10.9 % — ABNORMAL HIGH (ref 4.8–5.6)
Mean Plasma Glucose: 266.13 mg/dL

## 2021-09-02 LAB — LACTIC ACID, PLASMA: Lactic Acid, Venous: 0.7 mmol/L (ref 0.5–1.9)

## 2021-09-02 LAB — CBG MONITORING, ED: Glucose-Capillary: 158 mg/dL — ABNORMAL HIGH (ref 70–99)

## 2021-09-02 MED ORDER — GADOBUTROL 1 MMOL/ML IV SOLN
10.0000 mL | Freq: Once | INTRAVENOUS | Status: AC | PRN
  Administered 2021-09-02: 10 mL via INTRAVENOUS

## 2021-09-02 MED ORDER — ASPIRIN 325 MG PO TABS: 325.0000 mg | ORAL_TABLET | Freq: Every day | ORAL | Status: DC

## 2021-09-02 MED ORDER — CLOPIDOGREL BISULFATE 75 MG PO TABS
75.0000 mg | ORAL_TABLET | Freq: Every day | ORAL | Status: DC
  Administered 2021-09-02 – 2021-09-03 (×2): 75 mg via ORAL
  Filled 2021-09-02 (×2): qty 1

## 2021-09-02 MED ORDER — ACETAMINOPHEN 325 MG PO TABS: 650.0000 mg | ORAL_TABLET | ORAL | Status: DC | PRN

## 2021-09-02 MED ORDER — ACETAMINOPHEN 160 MG/5ML PO SOLN: 650.0000 mg | ORAL | Status: DC | PRN

## 2021-09-02 MED ORDER — LIVING WELL WITH DIABETES BOOK
Freq: Once | Status: AC
Start: 2021-09-02 — End: 2021-09-02
  Filled 2021-09-02 (×2): qty 1

## 2021-09-02 MED ORDER — ASPIRIN EC 81 MG PO TBEC
81.0000 mg | DELAYED_RELEASE_TABLET | Freq: Every day | ORAL | Status: DC
  Administered 2021-09-02 – 2021-09-03 (×2): 81 mg via ORAL
  Filled 2021-09-02 (×2): qty 1

## 2021-09-02 MED ORDER — ACETAMINOPHEN 650 MG RE SUPP: 650.0000 mg | RECTAL | Status: DC | PRN

## 2021-09-02 MED ORDER — INSULIN ASPART 100 UNIT/ML IJ SOLN
0.0000 [IU] | Freq: Every day | INTRAMUSCULAR | Status: DC
  Administered 2021-09-02: 2 [IU] via SUBCUTANEOUS

## 2021-09-02 MED ORDER — ENOXAPARIN SODIUM 40 MG/0.4ML IJ SOSY
40.0000 mg | PREFILLED_SYRINGE | INTRAMUSCULAR | Status: DC
  Administered 2021-09-02 – 2021-09-03 (×2): 40 mg via SUBCUTANEOUS
  Filled 2021-09-02 (×2): qty 0.4

## 2021-09-02 MED ORDER — EMPAGLIFLOZIN 25 MG PO TABS
25.0000 mg | ORAL_TABLET | Freq: Every day | ORAL | Status: DC
Start: 2021-09-02 — End: 2021-09-04
  Administered 2021-09-02 – 2021-09-03 (×2): 25 mg via ORAL
  Filled 2021-09-02 (×2): qty 1

## 2021-09-02 MED ORDER — INSULIN ASPART 100 UNIT/ML IJ SOLN
0.0000 [IU] | Freq: Three times a day (TID) | INTRAMUSCULAR | Status: DC
  Administered 2021-09-02 – 2021-09-03 (×3): 2 [IU] via SUBCUTANEOUS
  Administered 2021-09-03: 1 [IU] via SUBCUTANEOUS
  Administered 2021-09-03: 2 [IU] via SUBCUTANEOUS

## 2021-09-02 MED ORDER — STROKE: EARLY STAGES OF RECOVERY BOOK
Freq: Once | Status: AC
  Filled 2021-09-02: qty 1

## 2021-09-02 MED ORDER — IOHEXOL 350 MG/ML SOLN
100.0000 mL | Freq: Once | INTRAVENOUS | Status: AC | PRN
  Administered 2021-09-02: 100 mL via INTRAVENOUS

## 2021-09-02 MED ORDER — ASPIRIN 325 MG PO TABS
325.0000 mg | ORAL_TABLET | Freq: Once | ORAL | Status: AC
  Administered 2021-09-02: 325 mg via ORAL
  Filled 2021-09-02: qty 1

## 2021-09-02 MED ORDER — ROSUVASTATIN CALCIUM 20 MG PO TABS
20.0000 mg | ORAL_TABLET | Freq: Every day | ORAL | Status: DC
  Filled 2021-09-02: qty 1

## 2021-09-02 NOTE — Code Documentation (Signed)
Responded to Code Stroke called at 2333 for L facial droop and R grip weakness, [id number]. Pt arrived at 2342, NIH-2 for L facial droop and dysarthria, CT head negative for acute changes. TNK not given-symptoms improving. Pt remains in TNK window until 0320. Q59min VS and q79min neuro checks until then. Plan to w/o for stroke.  ?

## 2021-09-02 NOTE — Progress Notes (Signed)
STROKE TEAM PROGRESS NOTE  ? ?SUBJECTIVE (INTERVAL HISTORY) ?No family is at the bedside.  Overall his condition is completely resolved.  Patient sitting at edge of bed, stating that his symptoms resolved, he is back to baseline.  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  Denies any history of stroke in the past.  He admitted that his sugar and blood pressure not in good control.  He denies smoking and alcohol use. ? ? ?OBJECTIVE ?Temp:  [97.9 ?F (36.6 ?C)-98.7 ?F (37.1 ?C)] 98.2 ?F (36.8 ?C) (04/29 1647) ?Pulse Rate:  [61-86] 81 (04/29 1647) ?Cardiac Rhythm: Normal sinus rhythm (04/29 1555) ?Resp:  [9-21] 20 (04/29 1647) ?BP: (130-187)/(84-116) 183/116 (04/29 1647) ?SpO2:  [93 %-100 %] 100 % (04/29 1647) ?Weight:  [122.8 kg-127 kg] 127 kg (04/29 0000) ? ?Recent Labs  ?Lab 09/02/21 ?1148 09/02/21 ?1645  ?GLUCAP 158* 152*  ? ?Recent Labs  ?Lab 08/30/21 ?1350 09/02/21 ?0010 09/02/21 ?0020  ?NA 138 140 140  ?K 4.3 4.3 4.2  ?CL 107 106 106  ?CO2 28 26  --   ?GLUCOSE 239* 191* 190*  ?BUN 18 13 17   ?CREATININE 1.13 1.40* 1.40*  ?CALCIUM 8.7* 9.0  --   ? ?Recent Labs  ?Lab 08/30/21 ?1350 09/02/21 ?0010  ?AST 18 17  ?ALT 17 16  ?ALKPHOS 85 94  ?BILITOT 0.5 0.6  ?PROT 6.4* 6.3*  ?ALBUMIN 3.3* 3.2*  ? ?Recent Labs  ?Lab 08/30/21 ?1350 09/02/21 ?0010 09/02/21 ?0020  ?WBC 5.5 6.6  --   ?NEUTROABS 3.0 3.2  --   ?HGB 10.1* 10.5* 11.6*  ?HCT 33.2* 34.3* 34.0*  ?MCV 73.0* 72.4*  --   ?PLT 287 284  --   ? ?No results for input(s): CKTOTAL, CKMB, CKMBINDEX, TROPONINI in the last 168 hours. ?Recent Labs  ?  09/02/21 ?0010  ?LABPROT 12.3  ?INR 0.9  ? ?No results for input(s): COLORURINE, LABSPEC, PHURINE, GLUCOSEU, HGBUR, BILIRUBINUR, KETONESUR, PROTEINUR, UROBILINOGEN, NITRITE, LEUKOCYTESUR in the last 72 hours. ? ?Invalid input(s): APPERANCEUR  ?No results found for: CHOL, TRIG, HDL, CHOLHDL, VLDL, LDLCALC ?Lab Results  ?Component Value Date  ? HGBA1C 10.9 (H) 09/02/2021  ? ?No results found for: LABOPIA,  COCAINSCRNUR, LABBENZ, AMPHETMU, THCU, LABBARB  ?Recent Labs  ?Lab 09/02/21 ?0010  ?ETH <10  ? ? ?I have personally reviewed the radiological images below and agree with the radiology interpretations. ? ?CT ANGIO HEAD NECK W WO CM ? ?Result Date: 09/02/2021 ?CLINICAL DATA:  51 year old male with multiple small infarcts scattered in both cerebral hemispheres on MRI suspicious for embolic etiology. EXAM: CT ANGIOGRAPHY HEAD AND NECK TECHNIQUE: Multidetector CT imaging of the head and neck was performed using the standard protocol during bolus administration of intravenous contrast. Multiplanar CT image reconstructions and MIPs were obtained to evaluate the vascular anatomy. Carotid stenosis measurements (when applicable) are obtained utilizing NASCET criteria, using the distal internal carotid diameter as the denominator. RADIATION DOSE REDUCTION: This exam was performed according to the departmental dose-optimization program which includes automated exposure control, adjustment of the mA and/or kV according to patient size and/or use of iterative reconstruction technique. CONTRAST:  54 OMNIPAQUE IOHEXOL 350 MG/ML SOLN COMPARISON:  Brain MRI 0920 hours today. Head CT yesterday. FINDINGS: CT HEAD Brain: No midline shift, mass effect, or evidence of intracranial mass lesion. No acute intracranial hemorrhage identified. No ventriculomegaly. The scattered small acute and subacute infarcts by MRI are largely occult by CT. Underlying chronic white matter hypodensity most pronounced in the  right anterior corona radiata. Stable gray-white matter differentiation throughout the brain. Calvarium and skull base: No acute osseous abnormality identified. Paranasal sinuses: Visualized paranasal sinuses and mastoids are stable and well aerated. Orbits: No acute orbit or scalp soft tissue finding. CTA NECK Skeleton: Carious dentition. Cervical spine degeneration including some Ossification of the posterior longitudinal ligament  (OPLL). (C4 series 10, image 113). No acute osseous abnormality identified. Upper chest: Negative. Other neck: Glottis is closed. No acute soft tissue finding identified in the neck. Aortic arch: 3 vessel arch configuration. No significant arch atherosclerosis. Right carotid system: Patent with no atherosclerosis or stenosis. Left carotid system: Patent with minimal atherosclerosis of the left CCA and at the posterior left ICA origin. No stenosis. Vertebral arteries: Negative. CTA HEAD Posterior circulation: Codominant distal vertebral arteries and patent vertebrobasilar junction with mild irregularity but no significant stenosis. More pronounced mild-to-moderate Basilar artery irregularity (series 16 image 31) with up to mild mid basilar stenosis. Patent SCA and PCA origins. Posterior communicating arteries are diminutive or absent. Moderate bilateral long segment P2 and P3 irregularity and stenosis, up to severe on the right side (series 17, image 24). Distal PCA enhancement maintained. Anterior circulation: Both ICA siphons are patent. On the left there is mild calcified but up to moderate soft atherosclerotic plaque in the supraclinoid segment. Only mild stenosis results. Similar right siphon irregularity with mild to moderate supraclinoid stenosis. Patent carotid termini, MCA and ACA origins. Mild irregularity and stenosis at the left ACA origin. Normal anterior communicating artery. Bilateral ACA branches are patent but with moderate to severe 82 and 83 stenosis greater on the right (series 17, image 26). Left MCA M1 segment is patent but moderately irregular with mild to moderate stenosis distal to the anterior temporal artery which is irregular (series 16, image 27). Left MCA bifurcation remains patent. Left MCA branches are mildly irregular. Right MCA M1 segment and bifurcation are patent without stenosis. Right MCA branches are mildly irregular. Venous sinuses: Patent. Anatomic variants: None. Review of  the MIP images confirms the above findings IMPRESSION: 1. Negative for large vessel occlusion. 2. Minimal extracranial but Severe Intracranial Atherosclerosis. Multifocal circle-of-Willis branch irregularity and stenoses, most pronounced: - Moderate to Severe Right ACA A2 and A3 segments, - Moderate to Severe Right PCA P2 and P3 segments, - Moderate Left P2, distal Right ICA siphon,  Left MCA M1 segment. 3. Small acute and infarcts by MRI today are largely occult by CT. No new intracranial abnormality. 4. Carious dentition. Cervical spine degeneration including some ossification of the posterior longitudinal ligament (OPLL). Electronically Signed   By: Odessa FlemingH  Hall M.D.   On: 09/02/2021 10:45  ? ?MR BRAIN W WO CONTRAST ? ?Result Date: 09/02/2021 ?CLINICAL DATA:  51 year old male with hypertension, CHF. Left facial droop and numbness with difficulty speaking. Code stroke presentation. EXAM: MRI HEAD WITHOUT AND WITH CONTRAST TECHNIQUE: Multiplanar, multiecho pulse sequences of the brain and surrounding structures were obtained without and with intravenous contrast. CONTRAST:  10mL GADAVIST GADOBUTROL 1 MMOL/ML IV SOLN COMPARISON:  CT head last night. FINDINGS: Brain: Approximately eight scattered small foci of restricted diffusion in the bilateral cerebral hemispheres. Linear involvement of the bilateral anterior corona radiata. Left genu corpus callosum involvement on series 5, image 88. Isolated subtle enhancement of the anterior left corona radiata which appears to be the most subacute lesion. No enhancement associated with the other lesions. And there are underlying chronic lacunar infarcts of the bilateral cerebral white matter, corpus callosum, and bilateral thalami. Chronic microhemorrhage  in the left corona radiata. No chronic cortical encephalomalacia identified. No midline shift, mass effect, evidence of mass lesion, ventriculomegaly, extra-axial collection or acute intracranial hemorrhage. Cervicomedullary  junction and pituitary are within normal limits. No abnormal enhancement identified. No dural thickening. Vascular: The major dural venous sinuses are enhancing and appear to be patent. Are preserved major intracr

## 2021-09-02 NOTE — Progress Notes (Signed)
?                                  PROGRESS NOTE                                             ?                                                                                                                     ?                                         ? ? Patient Demographics:  ? ? Steven Cochran, is a 51 y.o. male, DOB - [birth year], [id number] ? ?Outpatient Primary MD for the patient is [name], NP    LOS - 0  Admit date - 09/02/2021   ? ?Chief Complaint  ?Patient presents with  ? Code Stroke  ?  LKW @2250 , while pt cleaning kitchen felt L hand go numb, L side facial droop, developed slurred speech. @ baseline L hand deficits and L eye blindness but pt states "felt weaker than normal".  ?    ? ?Brief Narrative (HPI from H&P)   51 y.o. male with medical history significant of HFrEF (EF previously as low as 15-20% though this had recovered to 50% as of most recent echo per patient), DM2, HTN.  Who had stopped taking all his blood pressure medications for several weeks and abruptly started taking them night of 08/31/2021 at higher than recommended dose, on 09/01/2021 while he was working in his kitchen he started experiencing some left-sided weakness along with facial droop and some difficulty in speech.  He came to the ER where he was seen by neurologist and admitted for further stroke work-up ? ? Subjective:  ? ? Swayze Folson today has, No headache, No chest pain, No abdominal pain - No Nausea, No new weakness tingling or numbness, no SOB. ? ? Assessment  & Plan :  ? ? ?Left sided weakness and facial droop with some dysarthria. -Rule out stroke, neuro team on board, full stroke pathway underway, currently on aspirin and Plavix with Plavix for 21 days, case discussed with stroke team.  Complete stroke work-up adjust medications tomorrow further as needed. ? ? ?2. Chronic systolic CHF (congestive heart failure) (HCC)  History of EF as low as 15-20% previously, though  this had improved all the way up to 50% on last echo per pt. echo remains preserved.  Noncompliant with medications.  For now permissive hypertension for #1 above, euvolemic.  Monitor.  Counseled on medication compliance.  Post discharge follow-up with PCP and primary cardiologist.  Blood pressure medications will be readjusted once stroke  work-up is done and symptoms have resolved. ? ?3 .HTN (hypertension) - Holding BP meds for permissive HTN. ? ?4. DM2 (diabetes mellitus, type 2) (HCC) - A1c is higher suggesting poor outpatient control due to hyperglycemia, continue home medication Jardiance, added sliding scale, insulin and diabetic education ? ?Lab Results  ?Component Value Date  ? HGBA1C 10.9 (H) 09/02/2021  ? ? ?CBG (last 3)  ?No results for input(s): GLUCAP in the last 72 hours. ? ?No results found for: CHOL, HDL, LDLCALC, LDLDIRECT, TRIG, CHOLHDL ?  ? ?   ? ?Condition - Fair ? ?Family Communication  :  None present ? ?Code Status :  Full ? ?Consults  :  Neuro ? ?PUD Prophylaxis :  ? ? Procedures  :    ? ?MRI brain with contrast.   ? ?Carotid ultrasound.   ? ?CT head.  Nonacute.   ? ?TTE - 1. Left ventricular ejection fraction, by estimation, is 45 to 50%. The left ventricle has mildly decreased function. The left ventricle demonstrates global hypokinesis. The left ventricular internal cavity size was mildly dilated. There is mild left ventricular hypertrophy. Left ventricular diastolic parameters are consistent with Grade I diastolic dysfunction (impaired relaxation).  2. Right ventricular systolic function is normal. The right ventricular size is normal.  3. The mitral valve is normal in structure. No evidence of mitral valve regurgitation. No evidence of mitral stenosis.  4. The aortic valve is tricuspid. Aortic valve regurgitation is not visualized. No aortic stenosis is present.  5. Aortic dilatation noted. There is moderate dilatation of the aortic root, measuring 44 mm.  6. The inferior vena cava is  normal in size with greater than 50% respiratory variability, suggesting right atrial pressure of 3 mmHg.  ? ?   ? ?Disposition Plan  :   ? ?Status is: Observation ? ?DVT Prophylaxis  :   ? ?enoxaparin (LOVENOX) injection 40 mg Start: 09/02/21 0600 ? ? ?Lab Results  ?Component Value Date  ? PLT 284 09/02/2021  ? ? ?Diet :  ?Diet Order   ? ?       ?  Diet Carb Modified Fluid consistency: Thin; Room service appropriate? Yes  Diet effective now       ?  ? ?  ?  ? ?  ?  ? ?Inpatient Medications ? ?Scheduled Meds: ?  stroke: early stages of recovery book   Does not apply Once  ? aspirin EC  81 mg Oral Daily  ? clopidogrel  75 mg Oral Daily  ? empagliflozin  25 mg Oral Daily  ? enoxaparin (LOVENOX) injection  40 mg Subcutaneous Q24H  ? ?Continuous Infusions: ?PRN Meds:.acetaminophen **OR** acetaminophen (TYLENOL) oral liquid 160 mg/5 mL **OR** acetaminophen ? ?Antibiotics  :   ? ?Anti-infectives (From admission, onward)  ? ? None  ? ?  ? ? ? Time Spent in minutes  30 ? ? ?Lala Lund M.D on 09/02/2021 at 9:45 AM ? ?To page go to [email or web address]  ? ?[facility] -  Office  [phone] ? ?See all Orders from today for further details ? ? ? Objective:  ? ?Vitals:  ? 09/02/21 0500 09/02/21 0515 09/02/21 0530 09/02/21 0545  ?BP: (!) 147/96 (!) 155/96 131/85 (!) 161/95  ?Pulse: 85 83 85 76  ?Resp: 11 12  15   ?Temp:      ?TempSrc:      ?SpO2: 99% 97% 95% 96%  ?Weight:      ?Height:      ? ? ?Wt  Readings from Last 3 Encounters:  ?09/02/21 127 kg  ?08/30/21 127 kg  ?12/23/20 118 kg  ? ? ?No intake or output data in the 24 hours ending 09/02/21 0945 ? ? ?Physical Exam ? ?Awake Alert, No new F.N deficits, minimal L sided facial droop chronic left eye blindness, ?Tangent.AT,PERRAL ?Supple Neck, No JVD,   ?Symmetrical Chest wall movement, Good air movement bilaterally, CTAB ?RRR,No Gallops,Rubs or new Murmurs,  ?+ve B.Sounds, Abd Soft, No tenderness,   ?No Cyanosis, Clubbing or edema  ?   ? ? Data Review:  ? ? ?CBC ?Recent Labs   ?Lab 08/30/21 ?1350 09/02/21 ?0010 09/02/21 ?0020  ?WBC 5.5 6.6  --   ?HGB 10.1* 10.5* 11.6*  ?HCT 33.2* 34.3* 34.0*  ?PLT 287 284  --   ?MCV 73.0* 72.4*  --   ?MCH 22.2* 22.2*  --   ?MCHC 30.4 30.6  --   ?RDW 14.4 14.5  --   ?LYMPHSABS 2.0 2.8  --   ?MONOABS 0.3 0.4  --   ?EOSABS 0.1 0.1  --   ?BASOSABS 0.0 0.0  --   ? ? ?Electrolytes ?Recent Labs  ?Lab 08/30/21 ?1350 09/02/21 ?0010 09/02/21 ?0020 09/02/21 ?0100 09/02/21 ?[id number]  ?NA 138 140 140  --   --   ?K 4.3 4.3 4.2  --   --   ?CL 107 106 106  --   --   ?CO2 28 26  --   --   --   ?GLUCOSE 239* 191* 190*  --   --   ?BUN 18 13 17   --   --   ?CREATININE 1.13 1.40* 1.40*  --   --   ?CALCIUM 8.7* 9.0  --   --   --   ?AST 18 17  --   --   --   ?ALT 17 16  --   --   --   ?ALKPHOS 85 94  --   --   --   ?BILITOT 0.5 0.6  --   --   --   ?ALBUMIN 3.3* 3.2*  --   --   --   ?LATICACIDVEN  --   --   --  0.7  --   ?INR  --  0.9  --   --   --   ?HGBA1C  --   --   --   --  10.9*  ? ? ?------------------------------------------------------------------------------------------------------------------ ?No results for input(s): CHOL, HDL, LDLCALC, TRIG, CHOLHDL, LDLDIRECT in the last 72 hours. ? ?Lab Results  ?Component Value Date  ? HGBA1C 10.9 (H) 09/02/2021  ? ? ?No results for input(s): TSH, T4TOTAL, T3FREE, THYROIDAB in the last 72 hours. ? ?Invalid input(s): FREET3 ?------------------------------------------------------------------------------------------------------------------ ?ID Labs ?Recent Labs  ?Lab 08/30/21 ?1350 09/02/21 ?0010 09/02/21 ?0020 09/02/21 ?0100  ?WBC 5.5 6.6  --   --   ?PLT 287 284  --   --   ?LATICACIDVEN  --   --   --  0.7  ?CREATININE 1.13 1.40* 1.40*  --   ? ?Cardiac Enzymes ?No results for input(s): CKMB, TROPONINI, MYOGLOBIN in the last 168 hours. ? ?Invalid input(s): CK ? ? ?Micro Results ?No results found for this or any previous visit (from the past 240 hour(s)). ? ?Radiology Reports ?ECHOCARDIOGRAM COMPLETE ? ?Result Date: 09/02/2021 ?    ECHOCARDIOGRAM REPORT   Patient Name:   [facility] Date of Exam: 09/02/2021 Medical Rec #:  [id number]     Height:       74.0 in  Accession #:    [id number]    Weight:       280.0 lb Date of Birth:  1970/12/13     BSA:

## 2021-09-02 NOTE — Assessment & Plan Note (Signed)
Holding BP meds for permissive HTN. ?
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
## 2021-09-02 NOTE — Assessment & Plan Note (Addendum)
1. Stroke pathway ?2. See neuro consult note ?3. Tele monitor ?4. 2d echo ?5. MRI brain ?6. PT/OT/SLP ?7. MRA head, carotid dopplers ?8. ASA 81 ?9. Plavix 75 for 21 days ?

## 2021-09-02 NOTE — Progress Notes (Signed)
?  Echocardiogram ?2D Echocardiogram has been performed. ? ?Steven Cochran ?09/02/2021, 9:03 AM ?

## 2021-09-02 NOTE — Assessment & Plan Note (Signed)
History of EF as low as 15-20% previously, though this had improved all the way up to 50% on last echo per pt. ?Unfortunately pt hasnt been taking any of his meds for the past month. ?Regardless, holding entresto, coreg, and Norvasc anyhow due to desire for permissive HTN in setting of likely acute ischemic stroke. ?

## 2021-09-02 NOTE — Progress Notes (Signed)
VASCULAR LAB ? ? ? ?Carotid duplex has been performed. ? ?See CV proc for preliminary results. ? ? ?Abdo Denault, RVT ?09/02/2021, 9:04 AM ? ?

## 2021-09-02 NOTE — Progress Notes (Signed)
Inpatient Diabetes Program Recommendations ? ?AACE/ADA: New Consensus Statement on Inpatient Glycemic Control  ? ?Target Ranges:  Prepandial:   less than 140 mg/dL ?     Peak postprandial:   less than 180 mg/dL (1-2 hours) ?     Critically ill patients:  140 - 180 mg/dL  ? ? Latest Reference Range & Units 09/02/21 11:48  ?Glucose-Capillary 70 - 99 mg/dL 350 (H)  ? ? Latest Reference Range & Units 09/02/21 00:10 09/02/21 00:20  ?Glucose 70 - 99 mg/dL 093 (H) 818 (H)  ? ? Latest Reference Range & Units 10/01/19 11:29 09/02/21 06:05  ?Hemoglobin A1C 4.8 - 5.6 % 9.2 (H) 10.9 (H)  ? ?Review of Glycemic Control ? ?Diabetes history: DM2 ?Outpatient Diabetes medications: Jardiance 25 mg daily (per last PCP note on 12/29/20) ?Current orders for Inpatient glycemic control: Novolog 0-9 units TID with meals, Novolog 0-5 units QHS, Jardiance 25 mg daily ? ?Inpatient Diabetes Program Recommendations:   ? ?HbgA1C: A1C 10.9% on 09/02/21 indicating an average glucose of 266 mg/dl over the past 2-3 months.  ? ?NOTE: Noted consult for Diabetes Coordinator. Diabetes Coordinator is not on campus over the weekend but available by pager from 8am to 5pm for questions or concerns. Chart reviewed. Noted patient last seen PCP on 12/29/20 and per office note patient was prescribed Jardiance 25 mg daily for DM control. Also noted patient sees [facility] and seen Sherol Dade, ANP on 06/28/20. Per office note note 06/28/20, "worsening control with A1c now 8.3% ?-- I advised patient that I think the majority of his A1c increase can be accounted for by the amount of liquid carbs he has been consuming recently ?-- advised him to decrease the carbs in his smoothies, add protein to his smoothies, and stop drinking Boost shakes  ?-- continue Jardiance 25 mg daily ?-- patient would like to try to modify diet over the next 3 months instead of adding a new medication ?-- if A1c remains above goal at next OV, patient needs additional medication  to improve blood sugar" ?No follow up visit with Endocrinology since per chart. Patient admitted on 09/02/21 with ischemic stroke. Current A1C 10.9% on 09/02/21 indicating an average glucose of 266 mg/dl over the past 2-3 months. Patient is currently still in the ED. Diabetes coordinator not on campus over the weekend. Called patient's cell phone number and went to voice mail. Will place order for bedside nursiing to provide further DM education. If patient still inpatient on Monday 5/1, inpatient diabetes team will see patient then. ? ?Thanks, ?Orlando Penner, RN, MSN, CDE ?Diabetes Coordinator ?Inpatient Diabetes Program ?[phone] (Team Pager from 8am to 5pm) ? ? ?

## 2021-09-02 NOTE — H&P (Signed)
?History and Physical  ? ? ?Patient: Steven Cochran [id number] DOB: Oct 23, 1970 ?DOA: 09/02/2021 ?DOS: the patient was seen and examined on 09/02/2021 ?PCP: Nelma Rothman, NP  ?Patient coming from: Home ? ?Chief Complaint:  ?Chief Complaint  ?Patient presents with  ? Code Stroke  ?  LKW @2250 , while pt cleaning kitchen felt L hand go numb, L side facial droop, developed slurred speech. @ baseline L hand deficits and L eye blindness but pt states "felt weaker than normal".  ? ?HPI: Steven Cochran is a 51 y.o. male with medical history significant of HFrEF (EF previously as low as 15-20% though this had recovered to 50% as of most recent echo per patient), DM2, HTN. ? ?Pt admits not taking his meds for past 1 month. ? ?Pt denies any h/o A.Fib, being on any AC.  I cant see where he was prescribed any AC based on the [facility] cards notes either. ? ?Pt presents to ED with c/o L facial droop, numbness, and difficulty speaking.  Symptoms onset this evening just PTA.  No headache, no new visual disturbances (he is chronically blind in L eye). ?  ?Review of Systems: As mentioned in the history of present illness. All other systems reviewed and are negative. ?Past Medical History:  ?Diagnosis Date  ? CHF (congestive heart failure) (H[facility])   ? Diabetes mellitus   ? Hypertension   ? ?No past surgical history on file. ?Social History:  reports that he has never smoked. He has never used smokeless tobacco. He reports that he does not drink alcohol and does not use drugs. ? ?Allergies  ?Allergen Reactions  ? Metformin And Related Diarrhea  ? ? ?No family history on file. ? ?Prior to Admission medications   ?Medication Sig Start Date End Date Taking? Authorizing Provider  ?carvedilol (COREG) 25 MG tablet Take 1 tablet (25 mg total) by mouth 2 (two) times daily. 08/30/21 11/28/21 Yes Kommor, Madison, MD  ?ENTRESTO 49-51 MG Take 1 tablet by mouth 2 (two) times daily. 08/30/21  Yes Kommor, Madison, MD  ?amLODipine (NORVASC) 5 MG tablet  Take 1 tablet (5 mg total) by mouth daily. ?Patient not taking: Reported on 09/02/2021 08/30/21   09/01/21, MD  ? ? ?Physical Exam: ?Vitals:  ? 09/02/21 0500 09/02/21 0515 09/02/21 0530 09/02/21 0545  ?BP: (!) 147/96 (!) 155/96 131/85 (!) 161/95  ?Pulse: 85 83 85 76  ?Resp: 11 12  15   ?Temp:      ?TempSrc:      ?SpO2: 99% 97% 95% 96%  ?Weight:      ?Height:      ? ?Constitutional: NAD, calm, comfortable ?Eyes: Blind L Eye ?ENMT: Mucous membranes are moist. Posterior pharynx clear of any exudate or lesions.Normal dentition.  ?Neck: normal, supple, no masses, no thyromegaly ?Respiratory: clear to auscultation bilaterally, no wheezing, no crackles. Normal respiratory effort. No accessory muscle use.  ?Cardiovascular: Regular rate and rhythm, no murmurs / rubs / gallops. No extremity edema. 2+ pedal pulses. No carotid bruits.  ?Abdomen: no tenderness, no masses palpated. No hepatosplenomegaly. Bowel sounds positive.  ?Musculoskeletal: no clubbing / cyanosis. No joint deformity upper and lower extremities. Good ROM, no contractures. Normal muscle tone.  ?Skin: no rashes, lesions, ulcers. No induration ?Neurologic: L facial droop and numbness are new, Blindness in L eye and difficulty with LUE motor function are old and baseline per patient. ?Psychiatric: Normal judgment and insight. Alert and oriented x 3. Normal mood.  ? ?Data Reviewed: ? ?CT head neg ?MRI  brain pending ? ?Assessment and Plan: ?* Acute ischemic stroke (HCC) ?Stroke pathway ?See neuro consult note ?Tele monitor ?2d echo ?MRI brain ?PT/OT/SLP ?MRA head, carotid dopplers ?ASA 81 ?Plavix 75 for 21 days ? ?Chronic systolic CHF (congestive heart failure) (HCC) ?History of EF as low as 15-20% previously, though this had improved all the way up to 50% on last echo per pt. ?Unfortunately pt hasnt been taking any of his meds for the past month. ?Regardless, holding entresto, coreg, and Norvasc anyhow due to desire for permissive HTN in setting of likely  acute ischemic stroke. ? ?DM2 (diabetes mellitus, type 2) (HCC) ?Check A1C ?Continue Jardiance ?CBG checks AC/HS ? ?HTN (hypertension) ?Holding BP meds for permissive HTN. ? ? ? ? ? Advance Care Planning:   Code Status: Full Code  ? ?Consults: Neuro ? ?Family Communication: No family in room ? ?Severity of Illness: ?The appropriate patient status for this patient is OBSERVATION. Observation status is judged to be reasonable and necessary in order to provide the required intensity of service to ensure the patient's safety. The patient's presenting symptoms, physical exam findings, and initial radiographic and laboratory data in the context of their medical condition is felt to place them at decreased risk for further clinical deterioration. Furthermore, it is anticipated that the patient will be medically stable for discharge from the hospital within 2 midnights of admission.  ? ?Author: ?Hillary Bow., DO ?09/02/2021 6:20 AM ? ?For on call review [email or web address].  ?

## 2021-09-02 NOTE — Evaluation (Signed)
Physical Therapy Evaluation ?Patient Details ?Name: Steven Cochran ?MRN: [id number] ?DOB: 03-02-1971 ?Today's Date: 09/02/2021 ? ?History of Present Illness ? Pt is a 51 y/o male admitted secondary to L facial droop, difficulty speaking and LUE numbness. MRI pending. PMH includes CHF, L eye blindness.  ?Clinical Impression ? Patient evaluated by Physical Therapy with no further acute PT needs identified. All education has been completed and the patient has no further questions. Pt overall at a mod I level for mobility tasks in the room; pt requesting to defer further mobility. Pt reports symptoms have resolved and feels he is at baseline. Do question possible safety awareness deficits, so may benefit from further cog testing. Pt reports he does not feel he needs further PT. See below for any follow-up Physical Therapy or equipment needs. PT is signing off. Thank you for this referral. If needs change, please re-consult.  ?   ?   ? ?Recommendations for follow up therapy are one component of a multi-disciplinary discharge planning process, led by the attending physician.  Recommendations may be updated based on patient status, additional functional criteria and insurance authorization. ? ?Follow Up Recommendations No PT follow up ? ?  ?Assistance Recommended at Discharge Intermittent Supervision/Assistance  ?Patient can return home with the following ?   ? ?  ?Equipment Recommendations None recommended by PT  ?Recommendations for Other Services ?    ?  ?Functional Status Assessment Patient has had a recent decline in their functional status and demonstrates the ability to make significant improvements in function in a reasonable and predictable amount of time.  ? ?  ?Precautions / Restrictions Precautions ?Precautions: None ?Restrictions ?Weight Bearing Restrictions: No  ? ?  ? ?Mobility ? Bed Mobility ?  ?  ?  ?  ?  ?  ?  ?General bed mobility comments: sitting EOB upon entry ?  ? ?Transfers ?Overall transfer level:  Modified independent ?  ?  ?  ?  ?  ?  ?  ?  ?  ?  ? ?Ambulation/Gait ?Ambulation/Gait assistance: Modified independent (Device/Increase time) ?Gait Distance (Feet): 15 Feet ?Assistive device: None ?Gait Pattern/deviations: Step-through pattern, Decreased stride length ?Gait velocity: Decreased ?  ?  ?General Gait Details: Pt only wanting to ambulate in the room. overall mod I. No LOB noted. ? ?Stairs ?  ?  ?  ?  ?  ? ?Wheelchair Mobility ?  ? ?Modified Rankin (Stroke Patients Only) ?Modified Rankin (Stroke Patients Only) ?Pre-Morbid Rankin Score: No symptoms ?Modified Rankin: No significant disability ? ?  ? ?Balance Overall balance assessment: Mild deficits observed, not formally tested ?  ?  ?  ?  ?  ?  ?  ?  ?  ?  ?  ?  ?  ?  ?  ?  ?  ?  ?   ? ? ? ?Pertinent Vitals/Pain Pain Assessment ?Pain Assessment: No/denies pain  ? ? ?Home Living Family/patient expects to be discharged to:: Private residence ?Living Arrangements: Other (Comment) (living with ex) ?Available Help at Discharge: Family;Available 24 hours/day ?Type of Home: Other(Comment) (condo) ?Home Access: Level entry ?  ?  ?Alternate Level Stairs-Number of Steps: 2 flights ?Home Layout: Multi-level ?Home Equipment: None ?   ?  ?Prior Function Prior Level of Function : Independent/Modified Independent ?  ?  ?  ?  ?  ?  ?  ?  ?  ? ? ?Hand Dominance  ?   ? ?  ?Extremity/Trunk Assessment  ? Upper Extremity Assessment ?  Upper Extremity Assessment: LUE deficits/detail ?LUE Deficits / Details: Reports L hand deficits at baseline ?  ? ?Lower Extremity Assessment ?Lower Extremity Assessment: Overall WFL for tasks assessed ?  ? ?Cervical / Trunk Assessment ?Cervical / Trunk Assessment: Normal  ?Communication  ? Communication: No difficulties  ?Cognition Arousal/Alertness: Awake/alert ?Behavior During Therapy: [facility] for tasks assessed/performed ?Overall Cognitive Status: No family/caregiver present to determine baseline cognitive functioning ?  ?  ?  ?  ?  ?  ?  ?  ?  ?   ?  ?  ?  ?  ?  ?  ?General Comments: Deficits noted in safety awareness ?  ?  ? ?  ?General Comments General comments (skin integrity, edema, etc.): Educated about "BE FAST" in recognizing CVA symptoms. Pt reports he feels close to baseline and does not feel he needs continued PT. ? ?  ?Exercises    ? ?Assessment/Plan  ?  ?PT Assessment Patient does not need any further PT services  ?PT Problem List   ? ?   ?  ?PT Treatment Interventions     ? ?PT Goals (Current goals can be found in the Care Plan section)  ?Acute Rehab PT Goals ?Patient Stated Goal: to go home ?PT Goal Formulation: With patient ?Time For Goal Achievement: 09/02/21 ?Potential to Achieve Goals: Good ? ?  ?Frequency   ?  ? ? ?Co-evaluation   ?  ?  ?  ?  ? ? ?  ?AM-PAC PT "6 Clicks" Mobility  ?Outcome Measure Help needed turning from your back to your side while in a flat bed without using bedrails?: None ?Help needed moving from lying on your back to sitting on the side of a flat bed without using bedrails?: None ?Help needed moving to and from a bed to a chair (including a wheelchair)?: None ?Help needed standing up from a chair using your arms (e.g., wheelchair or bedside chair)?: None ?Help needed to walk in hospital room?: None ?Help needed climbing 3-5 steps with a railing? : None ?6 Click Score: 24 ? ?  ?End of Session   ?Activity Tolerance: Patient tolerated treatment well ?Patient left: in bed;with call bell/phone within reach (sitting EOB in ED) ?Nurse Communication: Mobility status ?PT Visit Diagnosis: Other symptoms and signs involving the nervous system (R29.898) ?  ? ?Time: 9417-4081 ?PT Time Calculation (min) (ACUTE ONLY): 14 min ? ? ?Charges:   PT Evaluation ?$PT Eval Low Complexity: 1 Low ?  ?  ?   ? ? ?Steven Cochran, PT, DPT  ?Acute Rehabilitation Services  ?Pager: [phone] ?Office: [phone] ? ? ?Steven Cochran ?09/02/2021, 8:28 AM ?

## 2021-09-02 NOTE — Assessment & Plan Note (Signed)
Check A1C ?Continue Jardiance ?CBG checks AC/HS ?

## 2021-09-02 NOTE — ED Provider Notes (Signed)
?[facility] ?Provider Note ? ? ?CSN: [id number] ?Arrival date & time: 09/02/21  0002 ? ?An emergency department physician performed an initial assessment on this suspected stroke patient at 2344. ? ?History ? ?No chief complaint on file. ? ? ?Steven Kiltsllen Sami is a 51 y.o. male. ? ?Patient is a 51 year old male with history of hypertension, diabetes, and CHF.  Patient presenting today with complaints of left facial droop and numbness with associated difficulty speaking.  This started this evening just prior to arrival.  Patient denies weakness or numbness of the arm or leg.  He denies any headache or visual disturbances.  EMS was called and patient transported here as a code stroke.  Patient last seen normal at 2250.  Symptoms improving upon arrival. ? ?The history is provided by the patient.  ? ?  ? ?Home Medications ?Prior to Admission medications   ?Medication Sig Start Date End Date Taking? Authorizing Provider  ?amLODipine (NORVASC) 5 MG tablet Take 1 tablet (5 mg total) by mouth daily. 08/30/21   Kommor, Madison, MD  ?amoxicillin-clavulanate (AUGMENTIN) 875-125 MG tablet Take 1 tablet by mouth 2 (two) times daily. One po bid x 7 days 09/16/20   Zadie RhineWickline, Donald, MD  ?bacitracin ointment Apply 1 application topically 2 (two) times daily. 12/22/20   Mannie StabileAberman, Caroline C, PA-C  ?carvedilol (COREG) 25 MG tablet Take 1 tablet (25 mg total) by mouth 2 (two) times daily. 08/30/21 11/28/21  Kommor, Madison, MD  ?CINNAMON PO Take 1 tablet by mouth daily.    [provider]  ?doxycycline (VIBRAMYCIN) 100 MG capsule Take 1 capsule (100 mg total) by mouth 2 (two) times daily. 12/23/20   Henderly, Britni A, PA-C  ?ENTRESTO 49-51 MG Take 1 tablet by mouth 2 (two) times daily. 08/30/21   Kommor, Madison, MD  ?furosemide (LASIX) 40 MG tablet Take 1 tablet (40 mg total) by mouth daily for 5 days. 12/23/20 12/28/20  Henderly, Britni A, PA-C  ?magnesium 30 MG tablet Take 30 mg by mouth daily.     [provider]  ?NOVOLIN 70/30 RELION (70-30) 100 UNIT/ML injection Inject 25 Units into the skin daily as needed (BS). 09/28/19   [provider]  ?silver sulfADIAZINE (SILVADENE) 1 % cream Apply 1 application topically daily. 12/22/20   Mannie StabileAberman, Caroline C, PA-C  ?TURMERIC PO Take 1 tablet by mouth daily.    [provider]  ?   ? ?Allergies    ?Metformin and related   ? ?Review of Systems   ?Review of Systems  ?All other systems reviewed and are negative. ? ?Physical Exam ?Updated Vital Signs ?BP (!) 185/100 (BP Location: Right Arm)   Pulse 75   Temp 98.7 ?F (37.1 ?C) (Oral)   Resp 18   Ht 6\' 2"  (1.88 m)   Wt 127 kg   SpO2 97%   BMI 35.95 kg/m?  ?Physical Exam ?Vitals and nursing note reviewed.  ?Constitutional:   ?   General: He is not in acute distress. ?   Appearance: He is well-developed. He is not diaphoretic.  ?HENT:  ?   Head: Normocephalic and atraumatic.  ?Cardiovascular:  ?   Rate and Rhythm: Normal rate and regular rhythm.  ?   Heart sounds: No murmur heard. ?  No friction rub.  ?Pulmonary:  ?   Effort: Pulmonary effort is normal. No respiratory distress.  ?   Breath sounds: Normal breath sounds. No wheezing or rales.  ?Abdominal:  ?   General: Bowel  sounds are normal. There is no distension.  ?   Palpations: Abdomen is soft.  ?   Tenderness: There is no abdominal tenderness.  ?Musculoskeletal:     ?   General: Normal range of motion.  ?   Cervical back: Normal range of motion and neck supple.  ?Skin: ?   General: Skin is warm and dry.  ?Neurological:  ?   Mental Status: He is alert and oriented to person, place, and time.  ?   Motor: No weakness.  ?   Coordination: Coordination normal.  ?   Comments: There is a slight left-sided facial droop noted.  ? ? ?ED Results / Procedures / Treatments   ?Labs ?(all labs ordered are listed, but only abnormal results are displayed) ?Labs Reviewed  ?I-STAT CHEM 8, ED - Abnormal; Notable for the following components:  ?    Result  Value  ? Creatinine, Ser 1.40 (*)   ? Glucose, Bld 190 (*)   ? Calcium, Ion 1.08 (*)   ? Hemoglobin 11.6 (*)   ? HCT 34.0 (*)   ? All other components within normal limits  ?RESP PANEL BY RT-PCR (FLU A&B, COVID) ARPGX2  ?COMPREHENSIVE METABOLIC PANEL  ?CBC WITH DIFFERENTIAL/PLATELET  ?PROTIME-INR  ?LACTIC ACID, PLASMA  ?LACTIC ACID, PLASMA  ?ETHANOL  ? ? ?EKG ?None ? ?Radiology ?CT HEAD CODE STROKE WO CONTRAST ? ?Result Date: 09/01/2021 ?CLINICAL DATA:  Code stroke.  Hypertension EXAM: CT HEAD WITHOUT CONTRAST TECHNIQUE: Contiguous axial images were obtained from the base of the skull through the vertex without intravenous contrast. RADIATION DOSE REDUCTION: This exam was performed according to the departmental dose-optimization program which includes automated exposure control, adjustment of the mA and/or kV according to patient size and/or use of iterative reconstruction technique. COMPARISON:  12/30/2018. FINDINGS: Brain: No evidence of acute infarction, hemorrhage, cerebral edema, mass, mass effect, or midline shift. Ventricles and sulci are normal for age. No extra-axial fluid collection. Focal hypodensity is again noted in the right frontal lobe, likely sequela of remote infarct. Scattered periventricular white matter changes, likely the sequela of chronic small vessel ischemic disease. Vascular: No hyperdense vessel or unexpected calcification. Skull: Normal. Negative for fracture or focal lesion. Sinuses/Orbits: No acute finding.  Prior right lens replacement. Other: The mastoid air cells are well aerated. ASPECTS [facility] Stroke Program Early CT Score) - Ganglionic level infarction (caudate, lentiform nuclei, internal capsule, insula, M1-M3 cortex): 7 - Supraganglionic infarction (M4-M6 cortex): 3 Total score (0-10 with 10 being normal): 10 IMPRESSION: 1. No acute intracranial process. 2. ASPECTS is 10 Code stroke imaging results were communicated on 09/01/2021 at 11:57 pm to provider Dr. Derry Lory via  secure text paging. Electronically Signed   By: Wiliam Ke M.D.   On: 09/01/2021 23:58   ? ?Procedures ?Procedures  ? ? ?Medications Ordered in ED ?Medications - No data to display ? ?ED Course/ Medical Decision Making/ A&P ? ?This patient presents to the ED for concern of left-sided facial numbness, facial droop, and difficulty speaking, this involves an extensive number of treatment options, and is a complaint that carries with it a high risk of complications and morbidity.  The differential diagnosis includes acute CVA, TIA ? ? ?Co morbidities that complicate the patient evaluation ? ?None ? ? ?Additional history obtained: ? ?No additional history or external records needed ? ? ?Lab Tests: ? ?I Ordered, and personally interpreted labs.  The pertinent results include: Essentially unremarkable CBC, metabolic panel, EtOH ? ? ?Imaging Studies ordered: ? ?I  ordered imaging studies including CT of the head ?I independently visualized and interpreted imaging which showed no acute process ?I agree with the radiologist interpretation ? ? ?Cardiac Monitoring: / EKG: ? ?The patient was maintained on a cardiac monitor.  I personally viewed and interpreted the cardiac monitored which showed an underlying rhythm of: Sinus ? ? ?Consultations Obtained: ? ?I requested consultation with the hospitalist,  and discussed lab and imaging findings as well as pertinent plan - they recommend: Admission. ?Patient also seen by neurology upon presentation as patient arrived as a code stroke.  They also recommended admission ? ? ?Problem List / ED Course / Critical interventions / Medication management ? ?Patient arriving here after developing facial numbness, weakness, and difficulty speaking.  This began at approximately 2250 and symptoms are nearly resolved at the time of arrival.  Patient went immediately for head CT showing no acute process.  As patient's symptoms are improving, thrombolytics or intervention not indicated.  Patient  will be admitted to the hospitalist service for further work-up. ?No medications ordered ?I have reviewed the patients home medicines and have made adjustments as needed ? ? ?Social Determinants of Health: ? ?None ?

## 2021-09-03 ENCOUNTER — Other Ambulatory Visit: Payer: Self-pay | Admitting: Medical

## 2021-09-03 DIAGNOSIS — E782 Mixed hyperlipidemia: Secondary | ICD-10-CM

## 2021-09-03 DIAGNOSIS — E1159 Type 2 diabetes mellitus with other circulatory complications: Secondary | ICD-10-CM | POA: Diagnosis not present

## 2021-09-03 DIAGNOSIS — I10 Essential (primary) hypertension: Secondary | ICD-10-CM | POA: Diagnosis not present

## 2021-09-03 DIAGNOSIS — Z794 Long term (current) use of insulin: Secondary | ICD-10-CM | POA: Diagnosis not present

## 2021-09-03 DIAGNOSIS — I639 Cerebral infarction, unspecified: Secondary | ICD-10-CM

## 2021-09-03 LAB — COMPREHENSIVE METABOLIC PANEL
ALT: 16 U/L (ref 0–44)
AST: 18 U/L (ref 15–41)
Albumin: 2.8 g/dL — ABNORMAL LOW (ref 3.5–5.0)
Alkaline Phosphatase: 78 U/L (ref 38–126)
Anion gap: 8 (ref 5–15)
BUN: 29 mg/dL — ABNORMAL HIGH (ref 6–20)
CO2: 25 mmol/L (ref 22–32)
Calcium: 8.5 mg/dL — ABNORMAL LOW (ref 8.9–10.3)
Chloride: 105 mmol/L (ref 98–111)
Creatinine, Ser: 1.53 mg/dL — ABNORMAL HIGH (ref 0.61–1.24)
GFR, Estimated: 55 mL/min — ABNORMAL LOW (ref >60)
Glucose, Bld: 147 mg/dL — ABNORMAL HIGH (ref 70–99)
Potassium: 3.7 mmol/L (ref 3.5–5.1)
Sodium: 138 mmol/L (ref 135–145)
Total Bilirubin: 0.4 mg/dL (ref 0.3–1.2)
Total Protein: 5.7 g/dL — ABNORMAL LOW (ref 6.5–8.1)

## 2021-09-03 LAB — GLUCOSE, CAPILLARY
Glucose-Capillary: 143 mg/dL — ABNORMAL HIGH (ref 70–99)
Glucose-Capillary: 186 mg/dL — ABNORMAL HIGH (ref 70–99)
Glucose-Capillary: 170 mg/dL — ABNORMAL HIGH (ref 70–99)

## 2021-09-03 LAB — LIPID PANEL
Cholesterol: 238 mg/dL — ABNORMAL HIGH (ref 0–200)
HDL: 35 mg/dL — ABNORMAL LOW (ref >40)
LDL Cholesterol: 173 mg/dL — ABNORMAL HIGH (ref 0–99)
Total CHOL/HDL Ratio: 6.8 RATIO
Triglycerides: 150 mg/dL — ABNORMAL HIGH (ref <150)
VLDL: 30 mg/dL (ref 0–40)

## 2021-09-03 LAB — CBC WITH DIFFERENTIAL/PLATELET
Abs Immature Granulocytes: 0 10*3/uL (ref 0.00–0.07)
Basophils Absolute: 0 10*3/uL (ref 0.0–0.1)
Basophils Relative: 1 %
Eosinophils Absolute: 0.1 10*3/uL (ref 0.0–0.5)
Eosinophils Relative: 2 %
HCT: 31.5 % — ABNORMAL LOW (ref 39.0–52.0)
Hemoglobin: 9.8 g/dL — ABNORMAL LOW (ref 13.0–17.0)
Immature Granulocytes: 0 %
Lymphocytes Relative: 50 %
Lymphs Abs: 2.6 10*3/uL (ref 0.7–4.0)
MCH: 22.3 pg — ABNORMAL LOW (ref 26.0–34.0)
MCHC: 31.1 g/dL (ref 30.0–36.0)
MCV: 71.6 fL — ABNORMAL LOW (ref 80.0–100.0)
Monocytes Absolute: 0.4 10*3/uL (ref 0.1–1.0)
Monocytes Relative: 7 %
Neutro Abs: 2.1 10*3/uL (ref 1.7–7.7)
Neutrophils Relative %: 40 %
Platelets: 280 10*3/uL (ref 150–400)
RBC: 4.4 MIL/uL (ref 4.22–5.81)
RDW: 14.2 % (ref 11.5–15.5)
WBC: 5.3 10*3/uL (ref 4.0–10.5)
nRBC: 0 % (ref 0.0–0.2)

## 2021-09-03 LAB — MAGNESIUM: Magnesium: 1.9 mg/dL (ref 1.7–2.4)

## 2021-09-03 MED ORDER — EMPAGLIFLOZIN 25 MG PO TABS: 25.0000 mg | ORAL_TABLET | Freq: Every day | ORAL | 0 refills | Status: DC

## 2021-09-03 MED ORDER — CARVEDILOL 25 MG PO TABS: 25.0000 mg | ORAL_TABLET | Freq: Two times a day (BID) | ORAL | 2 refills | Status: DC

## 2021-09-03 MED ORDER — ENTRESTO 49-51 MG PO TABS
1.0000 | ORAL_TABLET | Freq: Two times a day (BID) | ORAL | 0 refills | Status: DC
Start: 2021-09-05 — End: 2022-07-12

## 2021-09-03 MED ORDER — ROSUVASTATIN CALCIUM 40 MG PO TABS: 40.0000 mg | ORAL_TABLET | Freq: Every day | ORAL | 0 refills | Status: DC

## 2021-09-03 MED ORDER — CLOPIDOGREL BISULFATE 75 MG PO TABS: 75.0000 mg | ORAL_TABLET | Freq: Every day | ORAL | 0 refills | Status: DC

## 2021-09-03 MED ORDER — ROSUVASTATIN CALCIUM 20 MG PO TABS
40.0000 mg | ORAL_TABLET | Freq: Every day | ORAL | Status: DC
  Administered 2021-09-03: 40 mg via ORAL
  Filled 2021-09-03: qty 2

## 2021-09-03 MED ORDER — ASPIRIN 81 MG PO TBEC: 81.0000 mg | DELAYED_RELEASE_TABLET | Freq: Every day | ORAL | 11 refills | Status: AC

## 2021-09-03 NOTE — Discharge Instructions (Signed)
Follow with Primary MD Nelma Rothman, NP in 7 days  ? ?Get CBC, CMP, 2 view Chest X ray -  checked next visit within 1 week by Primary MD  ? ?Activity: As tolerated with Full fall precautions use walker/cane & assistance as needed ? ?Disposition Home   ? ?Diet: Heart Healthy Low Carb ? ?Accuchecks 4 times/day, Once in AM empty stomach and then before each meal. ?Log in all results and show them to your Prim.MD in 3 days. ?If any glucose reading is under 80 or above 300 call your Prim MD immidiately. ?Follow Low glucose instructions for glucose under 80 as instructed. ? ? ?Special Instructions: If you have smoked or chewed Tobacco  in the last 2 yrs please stop smoking, stop any regular Alcohol  and or any Recreational drug use. ? ?On your next visit with your primary care physician please Get Medicines reviewed and adjusted. ? ?Please request your Prim.MD to go over all Hospital Tests and Procedure/Radiological results at the follow up, please get all Hospital records sent to your Prim MD by signing hospital release before you go home. ? ?If you experience worsening of your admission symptoms, develop shortness of breath, life threatening emergency, suicidal or homicidal thoughts you must seek medical attention immediately by calling 911 or calling your MD immediately  if symptoms less severe. ? ?You Must read complete instructions/literature along with all the possible adverse reactions/side effects for all the Medicines you take and that have been prescribed to you. Take any new Medicines after you have completely understood and accpet all the possible adverse reactions/side effects.  ? ?  ? ?

## 2021-09-03 NOTE — Progress Notes (Addendum)
STROKE TEAM PROGRESS NOTE  ? ?SUBJECTIVE (INTERVAL HISTORY) ?No family is at the bedside.  Patient is sitting at edge of bed, no complaints, no acute event overnight. ? ? ?OBJECTIVE ?Temp:  [97.9 ?F (36.6 ?C)-98.4 ?F (36.9 ?C)] 98.3 ?F (36.8 ?C) (04/30 1610) ?Pulse Rate:  [75-80] 78 (04/30 1610) ?Cardiac Rhythm: Normal sinus rhythm (04/30 0701) ?Resp:  [20] 20 (04/30 1610) ?BP: (156-199)/(96-109) 166/101 (04/30 1610) ?SpO2:  [98 %-100 %] 100 % (04/30 1610) ? ?Recent Labs  ?Lab 09/02/21 ?1645 09/02/21 ?2150 09/03/21 ?0602 09/03/21 ?1214 09/03/21 ?1554  ?GLUCAP 152* 225* 143* 186* 170*  ? ?Recent Labs  ?Lab 08/30/21 ?1350 09/02/21 ?0010 09/02/21 ?0020 09/03/21 ?1937  ?NA 138 140 140 138  ?K 4.3 4.3 4.2 3.7  ?CL 107 106 106 105  ?CO2 28 26  --  25  ?GLUCOSE 239* 191* 190* 147*  ?BUN 18 13 17  29*  ?CREATININE 1.13 1.40* 1.40* 1.53*  ?CALCIUM 8.7* 9.0  --  8.5*  ?MG  --   --   --  1.9  ? ?Recent Labs  ?Lab 08/30/21 ?1350 09/02/21 ?0010 09/03/21 ?09/05/21  ?AST 18 17 18   ?ALT 17 16 16   ?ALKPHOS 85 94 78  ?BILITOT 0.5 0.6 0.4  ?PROT 6.4* 6.3* 5.7*  ?ALBUMIN 3.3* 3.2* 2.8*  ? ?Recent Labs  ?Lab 08/30/21 ?1350 09/02/21 ?0010 09/02/21 ?0020 09/03/21 ?09/04/21  ?WBC 5.5 6.6  --  5.3  ?NEUTROABS 3.0 3.2  --  2.1  ?HGB 10.1* 10.5* 11.6* 9.8*  ?HCT 33.2* 34.3* 34.0* 31.5*  ?MCV 73.0* 72.4*  --  71.6*  ?PLT 287 284  --  280  ? ?No results for input(s): CKTOTAL, CKMB, CKMBINDEX, TROPONINI in the last 168 hours. ?Recent Labs  ?  09/02/21 ?0010  ?LABPROT 12.3  ?INR 0.9  ? ?No results for input(s): COLORURINE, LABSPEC, PHURINE, GLUCOSEU, HGBUR, BILIRUBINUR, KETONESUR, PROTEINUR, UROBILINOGEN, NITRITE, LEUKOCYTESUR in the last 72 hours. ? ?Invalid input(s): APPERANCEUR  ?   ?Component Value Date/Time  ? CHOL 238 (H) 09/03/2021 0547  ? TRIG 150 (H) 09/03/2021 0547  ? HDL 35 (L) 09/03/2021 0547  ? CHOLHDL 6.8 09/03/2021 0547  ? VLDL 30 09/03/2021 0547  ? LDLCALC 173 (H) 09/03/2021 0547  ? ?Lab Results  ?Component Value Date  ? HGBA1C 10.9 (H)  09/02/2021  ? ?No results found for: LABOPIA, COCAINSCRNUR, LABBENZ, AMPHETMU, THCU, LABBARB  ?Recent Labs  ?Lab 09/02/21 ?0010  ?ETH <10  ? ? ?I have personally reviewed the radiological images below and agree with the radiology interpretations. ? ?CT ANGIO HEAD NECK W WO CM ? ?Result Date: 09/02/2021 ?CLINICAL DATA:  51 year old male with multiple small infarcts scattered in both cerebral hemispheres on MRI suspicious for embolic etiology. EXAM: CT ANGIOGRAPHY HEAD AND NECK TECHNIQUE: Multidetector CT imaging of the head and neck was performed using the standard protocol during bolus administration of intravenous contrast. Multiplanar CT image reconstructions and MIPs were obtained to evaluate the vascular anatomy. Carotid stenosis measurements (when applicable) are obtained utilizing NASCET criteria, using the distal internal carotid diameter as the denominator. RADIATION DOSE REDUCTION: This exam was performed according to the departmental dose-optimization program which includes automated exposure control, adjustment of the mA and/or kV according to patient size and/or use of iterative reconstruction technique. CONTRAST:  M OMNIPAQUE IOHEXOL 350 MG/ML SOLN COMPARISON:  Brain MRI 0920 hours today. Head CT yesterday. FINDINGS: CT HEAD Brain: No midline shift, mass effect, or evidence of intracranial mass lesion. No acute intracranial hemorrhage identified. No  ventriculomegaly. The scattered small acute and subacute infarcts by MRI are largely occult by CT. Underlying chronic white matter hypodensity most pronounced in the right anterior corona radiata. Stable gray-white matter differentiation throughout the brain. Calvarium and skull base: No acute osseous abnormality identified. Paranasal sinuses: Visualized paranasal sinuses and mastoids are stable and well aerated. Orbits: No acute orbit or scalp soft tissue finding. CTA NECK Skeleton: Carious dentition. 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 (C4 series 10, image 113). No acute osseous abnormality identified. Upper chest: Negative. Other neck: Glottis is closed. No acute soft tissue finding identified in the neck. Aortic arch: 3 vessel arch configuration. No significant arch atherosclerosis. Right carotid system: Patent with no atherosclerosis or stenosis. Left carotid system: Patent with minimal atherosclerosis of the left CCA and at the posterior left ICA origin. No stenosis. Vertebral arteries: Negative. CTA HEAD Posterior circulation: Codominant distal vertebral arteries and patent vertebrobasilar junction with mild irregularity but no significant stenosis. More pronounced mild-to-moderate Basilar artery irregularity (series 16 image 31) with up to mild mid basilar stenosis. Patent SCA and PCA origins. Posterior communicating arteries are diminutive or absent. Moderate bilateral long segment P2 and P3 irregularity and stenosis, up to severe on the right side (series 17, image 24). Distal PCA enhancement maintained. Anterior circulation: Both ICA siphons are patent. On the left there is mild calcified but up to moderate soft atherosclerotic plaque in the supraclinoid segment. Only mild stenosis results. Similar right siphon irregularity with mild to moderate supraclinoid stenosis. Patent carotid termini, MCA and ACA origins. Mild irregularity and stenosis at the left ACA origin. Normal anterior communicating artery. Bilateral ACA branches are patent but with moderate to severe 82 and 83 stenosis greater on the right (series 17, image 26). Left MCA M1 segment is patent but moderately irregular with mild to moderate stenosis distal to the anterior temporal artery which is irregular (series 16, image 27). Left MCA bifurcation remains patent. Left MCA branches are mildly irregular. Right MCA M1 segment and bifurcation are patent without stenosis. Right MCA branches are mildly irregular. Venous  sinuses: Patent. Anatomic variants: None. Review of the MIP images confirms the above findings IMPRESSION: 1. Negative for large vessel occlusion. 2. Minimal extracranial but Severe Intracranial Atherosclerosis. Multifocal circle-of-Willis branch irregularity and stenoses, most pronounced: - Moderate to Severe Right ACA A2 and A3 segments, - Moderate to Severe Right PCA P2 and P3 segments, - Moderate Left P2, distal Right ICA siphon,  Left MCA M1 segment. 3. Small acute and infarcts by MRI today are largely occult by CT. No new intracranial abnormality. 4. Carious dentition. Cervical spine degeneration including some ossification of the posterior longitudinal ligament (OPLL). Electronically Signed   By: Odessa Fleming M.D.   On: 09/02/2021 10:45  ? ?MR BRAIN W WO CONTRAST ? ?Result Date: 09/02/2021 ?CLINICAL DATA:  51 year old male with hypertension, CHF. Left facial droop and numbness with difficulty speaking. Code stroke presentation. EXAM: MRI HEAD WITHOUT AND WITH CONTRAST TECHNIQUE: Multiplanar, multiecho pulse sequences of the brain and surrounding structures were obtained without and with intravenous contrast. CONTRAST:  34mL GADAVIST GADOBUTROL 1 MMOL/ML IV SOLN COMPARISON:  CT head last night. FINDINGS: Brain: Approximately eight scattered small foci of restricted diffusion in the bilateral cerebral hemispheres. Linear involvement of the bilateral anterior corona radiata. Left genu corpus callosum involvement on series 5, image 88. Isolated subtle enhancement of the anterior left corona radiata which appears to be the most subacute lesion. No enhancement associated  with the other lesions. And there are underlying chronic lacunar infarcts of the bilateral cerebral white matter, corpus callosum, and bilateral thalami. Chronic microhemorrhage in the left corona radiata. No chronic cortical encephalomalacia identified. No midline shift, mass effect, evidence of mass lesion, ventriculomegaly, extra-axial collection or  acute intracranial hemorrhage. Cervicomedullary junction and pituitary are within normal limits. No abnormal enhancement identified. No dural thickening. Vascular: The major dural venous sinuses are enhancing and ap

## 2021-09-03 NOTE — Evaluation (Signed)
Occupational Therapy Evaluation ?Patient Details ?Name: Steven Cochran ?MRN: [id number] ?DOB: Sep 14, 1970 ?Today's Date: 09/03/2021 ? ? ?History of Present Illness Pt is a 51 y/o male admitted secondary to L facial droop, difficulty speaking and LUE numbness. MRI pending. PMH includes CHF, L eye blindness.  ? ?Clinical Impression ?  ?Pt admitted for concerns listed above. Pta pt reported that he was independent with all ADL's and IADL's, including working for [employer]. At this time, pt reports he feels back to his baseline. He is able to complete BADL's and functional mobility with no assist. Vision and cognition WFL/at pt baseline. Pt has no further OT needs and acute OT will sign off.   ?   ? ?Recommendations for follow up therapy are one component of a multi-disciplinary discharge planning process, led by the attending physician.  Recommendations may be updated based on patient status, additional functional criteria and insurance authorization.  ? ?Follow Up Recommendations ? No OT follow up  ?  ?Assistance Recommended at Discharge None  ?Patient can return home with the following   ? ?  ?Functional Status Assessment ? Patient has had a recent decline in their functional status and demonstrates the ability to make significant improvements in function in a reasonable and predictable amount of time.  ?Equipment Recommendations ? None recommended by OT  ?  ?Recommendations for Other Services   ? ? ?  ?Precautions / Restrictions Precautions ?Precautions: None ?Restrictions ?Weight Bearing Restrictions: No  ? ?  ? ?Mobility Bed Mobility ?Overal bed mobility: Independent ?  ?  ?  ?  ?  ?  ?  ?  ? ?Transfers ?Overall transfer level: Modified independent ?Equipment used: None ?  ?  ?  ?  ?  ?  ?  ?  ?  ? ?  ?Balance Overall balance assessment: Mild deficits observed, not formally tested ?  ?  ?  ?  ?  ?  ?  ?  ?  ?  ?  ?  ?  ?  ?  ?  ?  ?  ?   ? ?ADL either performed or assessed with clinical judgement  ? ?ADL Overall ADL's : At  baseline;Modified independent ?  ?  ?  ?  ?  ?  ?  ?  ?  ?  ?  ?  ?  ?  ?  ?  ?  ?  ?  ?General ADL Comments: No concerns, able to complete with no assist, safely  ? ? ? ?Vision Baseline Vision/History: 1 Wears glasses ?Ability to See in Adequate Light: 1 Impaired ?Patient Visual Report: No change from baseline ?Vision Assessment?: No apparent visual deficits ?Additional Comments: Leye blind at baseline  ?   ?Perception   ?  ?Praxis   ?  ? ?Pertinent Vitals/Pain Pain Assessment ?Pain Assessment: No/denies pain  ? ? ? ?Hand Dominance Right ?  ?Extremity/Trunk Assessment Upper Extremity Assessment ?Upper Extremity Assessment: Overall WFL for tasks assessed (LUE grip mildly weaker than RUE grip) ?LUE Deficits / Details: Reports L hand deficits at baseline ?  ?Lower Extremity Assessment ?Lower Extremity Assessment: Defer to PT evaluation ?  ?Cervical / Trunk Assessment ?Cervical / Trunk Assessment: Normal ?  ?Communication Communication ?Communication: No difficulties ?  ?Cognition Arousal/Alertness: Awake/alert ?Behavior During Therapy: [facility] for tasks assessed/performed ?Overall Cognitive Status: Within Functional Limits for tasks assessed ?  ?  ?  ?  ?  ?  ?  ?  ?  ?  ?  ?  ?  ?  ?  ?  ?  ?  ?  ?  General Comments  VSS onRA ? ?  ?Exercises   ?  ?Shoulder Instructions    ? ? ?Home Living Family/patient expects to be discharged to:: Private residence ?Living Arrangements: Other (Comment) (Living with ex) ?Available Help at Discharge: Family;Available 24 hours/day ?Type of Home: Other(Comment) (condo) ?Home Access: Level entry ?  ?  ?Home Layout: Multi-level ?Alternate Level Stairs-Number of Steps: 2 flights ?Alternate Level Stairs-Rails: Right;Left ?Bathroom Shower/Tub: Tub/shower unit ?  ?Bathroom Toilet: Standard ?  ?  ?Home Equipment: Shower seat ?  ?  ?  ? ?  ?Prior Functioning/Environment Prior Level of Function : Independent/Modified Independent;Working/employed;Driving ?  ?  ?  ?  ?  ?  ?  ?  ?  ? ?  ?  ?OT Problem  List: Decreased strength;Decreased activity tolerance;Impaired balance (sitting and/or standing) ?  ?   ?OT Treatment/Interventions:    ?  ?OT Goals(Current goals can be found in the care plan section) Acute Rehab OT Goals ?Patient Stated Goal: To go home ?OT Goal Formulation: With patient ?Time For Goal Achievement: 09/03/21 ?Potential to Achieve Goals: Good  ?OT Frequency:   ?  ? ?Co-evaluation   ?  ?  ?  ?  ? ?  ?AM-PAC OT "6 Clicks" Daily Activity     ?Outcome Measure Help from another person eating meals?: None ?Help from another person taking care of personal grooming?: None ?Help from another person toileting, which includes using toliet, bedpan, or urinal?: None ?Help from another person bathing (including washing, rinsing, drying)?: None ?Help from another person to put on and taking off regular upper body clothing?: None ?Help from another person to put on and taking off regular lower body clothing?: None ?6 Click Score: 24 ?  ?End of Session Nurse Communication: Mobility status ? ?Activity Tolerance: Patient tolerated treatment well ?Patient left: in bed;with call bell/phone within reach;with bed alarm set ? ?OT Visit Diagnosis: Unsteadiness on feet (R26.81);Other abnormalities of gait and mobility (R26.89);Muscle weakness (generalized) (M62.81)  ?              ?Time: 9562-1308 ?OT Time Calculation (min): 8 min ?Charges:  OT General Charges ?$OT Visit: 1 Visit ?OT Evaluation ?$OT Eval Moderate Complexity: 1 Mod ? ?Odessia Asleson H., OTR/L ?Acute Rehabilitation ? ?Zane Samson Elane Bing Plume ?09/03/2021, 11:08 AM ?

## 2021-09-03 NOTE — Discharge Summary (Signed)
?                                                                                ? Steven Cochran [id number] DOB: 1970/11/10 DOA: 09/02/2021 ? ?PCP: [name], NP ? ?Admit date: 09/02/2021  Discharge date: 09/03/2021 ? ?Admitted From: Home   Disposition:  Home ? ? ?Recommendations for Outpatient Follow-up:  ? ?Follow up with PCP in 1-2 weeks ? ?PCP Please obtain BMP/CBC, 2 view CXR in 1week,  (see Discharge instructions)  ? ?PCP Please follow up on the following pending results: Monitor CBGs and glycemic control closely along with secondary risk factors for stroke, must follow with neurology and cardiology in 1 to 2 weeks post discharge ? ? ?Home Health: None   ?Equipment/Devices: None  ?Consultations: Neuro ?Discharge Condition: Stable    ?CODE STATUS: Full    ?Diet Recommendation: Heart Healthy Low Carb ?  ? ?Chief Complaint  ?Patient presents with  ? Code Stroke  ?  LKW @2250 , while pt cleaning kitchen felt L hand go numb, L side facial droop, developed slurred speech. @ baseline L hand deficits and L eye blindness but pt states "felt weaker than normal".  ?  ? ?Brief history of present illness from the day of admission and additional interim summary   ? ?51 y.o. male with medical history significant of HFrEF (EF previously as low as 15-20% though this had recovered to 50% as of most recent echo per patient), DM2, HTN.  Who had stopped taking all his blood pressure medications for several weeks and abruptly started taking them night of 08/31/2021 at higher than recommended dose, on 09/01/2021 while he was working in his kitchen he started experiencing some left-sided weakness along with facial droop and some difficulty in speech.  He came to the ER where he was seen by neurologist and admitted for further stroke work-up ? ?                                                               Hospital Course  ? ? Left  sided weakness and facial droop with some dysarthria. -Rule out stroke, neuro team on board, full stroke pathway underway, currently on statin, aspirin and Plavix , Plavix for 21 days only thereafter aspirin and statin indefinitely, case discussed with stroke team.  Dr. Erlinda Hong on 09/03/2021, there is a small chance that since patient was noncompliant with his blood pressure medications and overdosed himself with blood pressure medications all of a sudden night before this event that his symptoms could be brought on by transient relative hypotension in the setting of severe intracranial atherosclerosis. ? ?At this time he has been strictly counseled on compliance with all his medications, he will be discharged home on dual antiplatelet therapy along with statin as above, he will also get a 30-day event monitor upon discharge to rule out cardiac embolic event.  We will follow with PCP and neurology postdischarge ?  ?  ?2.  Chronic systolic CHF (congestive heart failure) (HCC)  History of EF as low as 15-20% previously, though this had improved all the way up to 50% on last echo per pt. echo remains preserved.  Noncompliant with medications.  For now permissive hypertension for #1 above, euvolemic.  Monitor.  Counseled on medication compliance.  Post discharge follow-up with PCP and primary cardiologist.  ?  ?3 .HTN (hypertension) -blood pressure medical regimen adjusted at discharge, counseled on compliance ?  ?4. DM2 (diabetes mellitus, type 2) (HCC) - A1c above 10  suggesting poor outpatient control due to hyperglycemia, but on any medications at home placed on Jardiance, insulin and diabetic education provided.  Follow-up with PCP in a week.  PCP to monitor glycemic control closely. ? ? ?Discharge diagnosis   ? ? ?Principal Problem: ?  Acute ischemic stroke ([place]) ?Active Problems: ?  HTN (hypertension) ?  DM2 (diabetes mellitus, type 2) ([place]) ?  Chronic systolic CHF (congestive heart failure) ([place]) ? ? ? ?Discharge  instructions   ? ?Discharge Instructions   ? ? Discharge instructions   Complete by: As directed ?  ? Follow with Primary MD [name], NP in 7 days  ? ?Get CBC, CMP, 2 view Chest X ray -  checked next visit within 1 week by Primary MD  ? ?Activity: As tolerated with Full fall precautions use walker/cane & assistance as needed ? ?Disposition Home   ? ?Diet: Heart Healthy Low Carb ? ?Accuchecks 4 times/day, Once in AM empty stomach and then before each meal. ?Log in all results and show them to your Prim.MD in 3 days. ?If any glucose reading is under 80 or above 300 call your Prim MD immidiately. ?Follow Low glucose instructions for glucose under 80 as instructed. ? ? ?Special Instructions: If you have smoked or chewed Tobacco  in the last 2 yrs please stop smoking, stop any regular Alcohol  and or any Recreational drug use. ? ?On your next visit with your primary care physician please Get Medicines reviewed and adjusted. ? ?Please request your Prim.MD to go over all Hospital Tests and Procedure/Radiological results at the follow up, please get all Hospital records sent to your Prim MD by signing hospital release before you go home. ? ?If you experience worsening of your admission symptoms, develop shortness of breath, life threatening emergency, suicidal or homicidal thoughts you must seek medical attention immediately by calling 911 or calling your MD immediately  if symptoms less severe. ? ?You Must read complete instructions/literature along with all the possible adverse reactions/side effects for all the Medicines you take and that have been prescribed to you. Take any new Medicines after you have completely understood and accpet all the possible adverse reactions/side effects.  ? Increase activity slowly   Complete by: As directed ?  ? ?  ? ? ?Discharge Medications  ? ?Allergies as of 09/03/2021   ? ?   Reactions  ? Metformin And Related Diarrhea  ? ?  ? ?  ?Medication List  ?  ? ?STOP taking these  medications   ? ?amLODipine 5 MG tablet ?Commonly known as: NORVASC ?  ? ?  ? ?TAKE these medications   ? ?aspirin 81 MG EC tablet ?Take 1 tablet (81 mg total) by mouth daily. Swallow whole. ?Start taking on: Sep 04, 2021 ?  ?carvedilol 25 MG tablet ?Commonly known as: COREG ?Take 1 tablet (25 mg total) by mouth 2 (two) times daily. ?Start taking on: Sep 04, 2021 ?  ?  clopidogrel 75 MG tablet ?Commonly known as: PLAVIX ?Take 1 tablet (75 mg total) by mouth daily. ?Start taking on: Sep 04, 2021 ?  ?empagliflozin 25 MG Tabs tablet ?Commonly known as: JARDIANCE ?Take 1 tablet (25 mg total) by mouth daily. ?Start taking on: Sep 04, 2021 ?  ?Entresto 49-51 MG ?Generic drug: sacubitril-valsartan ?Take 1 tablet by mouth 2 (two) times daily. ?Start taking on: Sep 05, 2021 ?What changed: These instructions start on Sep 05, 2021. If you are unsure what to do until then, ask your doctor or other care provider. ?  ?rosuvastatin 40 MG tablet ?Commonly known as: CRESTOR ?Take 1 tablet (40 mg total) by mouth daily. ?Start taking on: Sep 04, 2021 ?  ? ?  ? ? ? Follow-up Information   ? ? [place] [facility] Follow up.   ?Specialty: Cardiology ?Why: Our office will contact you to arrange an outpatient heart monitor, as well as a follow-up appointment to review your results. ?Contact information: ?[address], [address] ?[place] ?[phone] ? ?  ?  ? ? [name], NP. Schedule an appointment as soon as possible for a visit in 1 week(s).   ?Specialty: Internal Medicine ?Contact information: ?[address] ?[place] [address] ?[phone] ? ? ?  ?  ? ? [facility]. Schedule an appointment as soon as possible for a visit in 2 week(s).   ?Contact information: ?[place] [phone] ?[phone] ? ?  ?  ? ?  ?  ? ?  ? ? ?Major procedures and Radiology Reports - PLEASE review detailed and final  reports thoroughly  -    ? ?  ?CT ANGIO HEAD NECK W WO CM ? ?Result Date: 09/02/2021 ?CLINICAL DATA:  51 year old male with multiple small infarcts scattered in both cerebral hemispheres on MRI suspicious for embol

## 2021-09-03 NOTE — Progress Notes (Signed)
? ?  Medicine team asked cardiology to coordinate an outpatient 30-day monitor to further evaluate etiology of stroke.  Patient does not follow with our practice.  We will place order for monitor for Dr. Mayford Knife to read who is beeper call in the hospital 09/03/2021.  We will send a message to scheduling to arrange a follow-up appointment in 2 months to review her results. ?

## 2021-09-03 NOTE — Progress Notes (Signed)
SLP Cancellation Note ? ?Patient Details ?Name: Steven Cochran ?MRN: [id number] ?DOB: 08/31/1970 ? ? ?Cancelled treatment:       Reason Eval/Treat Not Completed: SLP screened, no needs identified, will sign off (Per OT, back to baseline) ? ?Jensen Cheramie MA, CCC-SLP ? ?[name] ?09/03/2021, 1:23 PM ?

## 2021-09-03 NOTE — TOC Transition Note (Signed)
Transition of Care (TOC) - CM/SW Discharge Note ? ? ?Patient Details  ?Name: Steven Cochran ?MRN: [id number] ?Date of Birth: 19-Sep-1970 ? ?Transition of Care (TOC) CM/SW Contact:  ?Carles Collet, RN ?Phone Number: ?09/03/2021, 10:38 AM ? ? ?Clinical Narrative:   patient provided with jardience card. No other TOC needs identified.  ? ? ? ?  ?  ? ? ?Patient Goals and CMS Choice ?  ?  ?  ? ?Discharge Placement ?  ?           ?  ?  ?  ?  ? ?Discharge Plan and Services ?  ?  ?           ?  ?  ?  ?  ?  ?  ?  ?  ?  ?  ? ?Social Determinants of Health (SDOH) Interventions ?  ? ? ?Readmission Risk Interventions ?   ? View : No data to display.  ?  ?  ?  ? ? ? ? ? ?

## 2021-09-04 ENCOUNTER — Other Ambulatory Visit: Payer: Self-pay | Admitting: Medical

## 2021-09-04 ENCOUNTER — Encounter: Payer: Self-pay | Admitting: *Deleted

## 2021-09-04 DIAGNOSIS — I639 Cerebral infarction, unspecified: Secondary | ICD-10-CM

## 2021-09-04 DIAGNOSIS — I4891 Unspecified atrial fibrillation: Secondary | ICD-10-CM

## 2021-09-04 NOTE — Progress Notes (Signed)
Patient ID: Steven Cochran, male   DOB: July 27, 1970, 51 y.o.   MRN: [id number] ?Patient enrolled for [employer] to ship a 30 day cardiac event monitor to his address on file. ? ?Letter with instructions mailed to patient. ? ?Dr. Radford Pax to read. ?

## 2021-10-11 DIAGNOSIS — H5442A5 Blindness left eye category 5, normal vision right eye: Secondary | ICD-10-CM | POA: Diagnosis not present

## 2021-10-11 DIAGNOSIS — H3582 Retinal ischemia: Secondary | ICD-10-CM | POA: Diagnosis not present

## 2021-10-11 DIAGNOSIS — H31091 Other chorioretinal scars, right eye: Secondary | ICD-10-CM | POA: Diagnosis not present

## 2021-10-11 DIAGNOSIS — E113511 Type 2 diabetes mellitus with proliferative diabetic retinopathy with macular edema, right eye: Secondary | ICD-10-CM | POA: Diagnosis not present
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
## 2021-10-18 ENCOUNTER — Telehealth: Payer: Self-pay | Admitting: *Deleted

## 2021-10-18 ENCOUNTER — Encounter: Payer: Self-pay | Admitting: Neurology

## 2021-10-18 ENCOUNTER — Ambulatory Visit (INDEPENDENT_AMBULATORY_CARE_PROVIDER_SITE_OTHER): Payer: Managed Care, Other (non HMO) | Admitting: Neurology

## 2021-10-18 VITALS — BP 200/116 | HR 68 | Ht 74.0 in | Wt 270.0 lb

## 2021-10-18 DIAGNOSIS — I639 Cerebral infarction, unspecified: Secondary | ICD-10-CM

## 2021-10-18 DIAGNOSIS — Z794 Long term (current) use of insulin: Secondary | ICD-10-CM

## 2021-10-18 DIAGNOSIS — I10 Essential (primary) hypertension: Secondary | ICD-10-CM

## 2021-10-18 DIAGNOSIS — E1159 Type 2 diabetes mellitus with other circulatory complications: Secondary | ICD-10-CM | POA: Diagnosis not present

## 2021-10-18 MED ORDER — AMLODIPINE BESYLATE 5 MG PO TABS: 5.0000 mg | ORAL_TABLET | Freq: Every day | ORAL | 3 refills | Status: DC

## 2021-10-18 MED ORDER — CARVEDILOL 25 MG PO TABS: 25.0000 mg | ORAL_TABLET | Freq: Two times a day (BID) | ORAL | 3 refills | Status: DC

## 2021-10-18 MED ORDER — ROSUVASTATIN CALCIUM 40 MG PO TABS: 40.0000 mg | ORAL_TABLET | Freq: Every day | ORAL | 3 refills | Status: DC

## 2021-10-18 NOTE — Patient Instructions (Addendum)
Stop the Plavix, remain on aspirin 81 mg daily as single agent Get a BP cuff, goal to keep BP < 130/80, add on Norvasc 5 mg daily Follow-up with primary care, endocrinology, cardiology  Need to check on heart monitor, important to wear this Refill on Crestor, goal for LDL < 70 Return back in 4-6 months with me   Meds ordered this encounter  Medications   amLODipine (NORVASC) 5 MG tablet    Sig: Take 1 tablet (5 mg total) by mouth daily.    Dispense:  30 tablet    Refill:  3   rosuvastatin (CRESTOR) 40 MG tablet    Sig: Take 1 tablet (40 mg total) by mouth daily.    Dispense:  30 tablet    Refill:  3   carvedilol (COREG) 25 MG tablet    Sig: Take 1 tablet (25 mg total) by mouth 2 (two) times daily.    Dispense:  60 tablet    Refill:  3

## 2021-10-18 NOTE — Progress Notes (Signed)
Thanks I agree with the above plan

## 2021-10-18 NOTE — Telephone Encounter (Signed)
New Message:     Please call Sarah, concerning this patient please.

## 2021-10-18 NOTE — Telephone Encounter (Signed)
Returned call to Steven Cochran and answered all her questions. Also explained if patient has difficulty applying the monitor he is welcome to call our office and we can schedule an appt for him to come in and have it applied

## 2021-10-19 ENCOUNTER — Ambulatory Visit (INDEPENDENT_AMBULATORY_CARE_PROVIDER_SITE_OTHER): Payer: Managed Care, Other (non HMO)

## 2021-10-19 DIAGNOSIS — I4891 Unspecified atrial fibrillation: Secondary | ICD-10-CM

## 2021-10-19 DIAGNOSIS — I639 Cerebral infarction, unspecified: Secondary | ICD-10-CM

## 2021-10-26 DIAGNOSIS — E113511 Type 2 diabetes mellitus with proliferative diabetic retinopathy with macular edema, right eye: Secondary | ICD-10-CM | POA: Diagnosis not present

## 2021-11-06 DIAGNOSIS — E113511 Type 2 diabetes mellitus with proliferative diabetic retinopathy with macular edema, right eye: Secondary | ICD-10-CM | POA: Diagnosis not present

## 2021-11-15 ENCOUNTER — Telehealth: Payer: Self-pay | Admitting: Cardiology

## 2021-11-15 NOTE — Telephone Encounter (Signed)
Returned call to patient. Patient was confused about why he has appointment with Dr. Mayford Knife and also needed to know how long to wear his monitor.   Advised patient based on when monitor was applied (10/18/2021) that 30 days would be on 11/17/2021. He verbalized understanding of how to send monitor back to be read.  He wants to cancel his appointment and follow up with his PCP instead of seeing cardiology.  Patient states he does not have a car and extra appointments are a hardship due to lack of transportation and cost of [employer] rides. Advised patient that if he decides to cancel cardiology appointment that he needs to at minimum follow up with his PCP. Patient agreed to this plan.

## 2021-11-15 NOTE — Telephone Encounter (Signed)
Patient called stated he was confused about his medical plan and would like a call back to clarify his care plan going forward.

## 2021-11-15 NOTE — Telephone Encounter (Signed)
Left message for patient to call back  

## 2021-11-16 NOTE — Telephone Encounter (Signed)
Left message for patient to call back  

## 2021-11-20 ENCOUNTER — Ambulatory Visit: Payer: Medicare Other | Admitting: Cardiology

## 2021-11-20 NOTE — Telephone Encounter (Signed)
Spoke with the patient who decided to cancel his appointment for today with Dr. Mayford Knife.

## 2021-12-20 DIAGNOSIS — E113511 Type 2 diabetes mellitus with proliferative diabetic retinopathy with macular edema, right eye: Secondary | ICD-10-CM | POA: Diagnosis not present
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
## 2022-03-27 ENCOUNTER — Ambulatory Visit: Payer: Medicare Other | Admitting: Neurology

## 2022-03-27 ENCOUNTER — Encounter: Payer: Self-pay | Admitting: Neurology

## 2022-06-13 ENCOUNTER — Emergency Department (HOSPITAL_COMMUNITY): Payer: Medicare Other

## 2022-06-13 ENCOUNTER — Inpatient Hospital Stay (HOSPITAL_COMMUNITY)
Admission: EM | Admit: 2022-06-13 | Discharge: 2022-06-15 | DRG: 638 | Disposition: A | Discharge status: Home / Self Care | Payer: Medicare Other | Attending: Internal Medicine | Admitting: Internal Medicine

## 2022-06-13 ENCOUNTER — Other Ambulatory Visit: Payer: Self-pay

## 2022-06-13 ENCOUNTER — Encounter (HOSPITAL_COMMUNITY): Payer: Self-pay | Admitting: Emergency Medicine

## 2022-06-13 DIAGNOSIS — R52 Pain, unspecified: Secondary | ICD-10-CM | POA: Diagnosis not present

## 2022-06-13 DIAGNOSIS — E1151 Type 2 diabetes mellitus with diabetic peripheral angiopathy without gangrene: Secondary | ICD-10-CM | POA: Diagnosis present

## 2022-06-13 DIAGNOSIS — Z888 Allergy status to other drugs, medicaments and biological substances status: Secondary | ICD-10-CM | POA: Diagnosis not present

## 2022-06-13 DIAGNOSIS — E871 Hypo-osmolality and hyponatremia: Secondary | ICD-10-CM | POA: Diagnosis present

## 2022-06-13 DIAGNOSIS — N1831 Chronic kidney disease, stage 3a: Secondary | ICD-10-CM | POA: Diagnosis present

## 2022-06-13 DIAGNOSIS — I10 Essential (primary) hypertension: Secondary | ICD-10-CM | POA: Diagnosis not present

## 2022-06-13 DIAGNOSIS — K219 Gastro-esophageal reflux disease without esophagitis: Secondary | ICD-10-CM | POA: Diagnosis present

## 2022-06-13 DIAGNOSIS — N179 Acute kidney failure, unspecified: Secondary | ICD-10-CM | POA: Diagnosis present

## 2022-06-13 DIAGNOSIS — E11628 Type 2 diabetes mellitus with other skin complications: Principal | ICD-10-CM | POA: Diagnosis present

## 2022-06-13 DIAGNOSIS — I130 Hypertensive heart and chronic kidney disease with heart failure and stage 1 through stage 4 chronic kidney disease, or unspecified chronic kidney disease: Secondary | ICD-10-CM | POA: Diagnosis present

## 2022-06-13 DIAGNOSIS — E1122 Type 2 diabetes mellitus with diabetic chronic kidney disease: Secondary | ICD-10-CM | POA: Diagnosis present

## 2022-06-13 DIAGNOSIS — Z7984 Long term (current) use of oral hypoglycemic drugs: Secondary | ICD-10-CM

## 2022-06-13 DIAGNOSIS — E1149 Type 2 diabetes mellitus with other diabetic neurological complication: Secondary | ICD-10-CM

## 2022-06-13 DIAGNOSIS — Z23 Encounter for immunization: Secondary | ICD-10-CM | POA: Diagnosis not present

## 2022-06-13 DIAGNOSIS — E119 Type 2 diabetes mellitus without complications: Secondary | ICD-10-CM

## 2022-06-13 DIAGNOSIS — L089 Local infection of the skin and subcutaneous tissue, unspecified: Secondary | ICD-10-CM | POA: Diagnosis not present

## 2022-06-13 DIAGNOSIS — I5022 Chronic systolic (congestive) heart failure: Secondary | ICD-10-CM | POA: Diagnosis present

## 2022-06-13 DIAGNOSIS — Z7982 Long term (current) use of aspirin: Secondary | ICD-10-CM | POA: Diagnosis not present

## 2022-06-13 DIAGNOSIS — E1165 Type 2 diabetes mellitus with hyperglycemia: Secondary | ICD-10-CM | POA: Diagnosis present

## 2022-06-13 DIAGNOSIS — E1140 Type 2 diabetes mellitus with diabetic neuropathy, unspecified: Secondary | ICD-10-CM | POA: Diagnosis present

## 2022-06-13 DIAGNOSIS — E11621 Type 2 diabetes mellitus with foot ulcer: Principal | ICD-10-CM | POA: Diagnosis present

## 2022-06-13 DIAGNOSIS — I5042 Chronic combined systolic (congestive) and diastolic (congestive) heart failure: Secondary | ICD-10-CM | POA: Diagnosis present

## 2022-06-13 DIAGNOSIS — Z79899 Other long term (current) drug therapy: Secondary | ICD-10-CM

## 2022-06-13 DIAGNOSIS — S91119A Laceration without foreign body of unspecified toe without damage to nail, initial encounter: Secondary | ICD-10-CM | POA: Diagnosis present

## 2022-06-13 DIAGNOSIS — W268XXA Contact with other sharp object(s), not elsewhere classified, initial encounter: Secondary | ICD-10-CM | POA: Diagnosis not present

## 2022-06-13 DIAGNOSIS — L97529 Non-pressure chronic ulcer of other part of left foot with unspecified severity: Secondary | ICD-10-CM | POA: Diagnosis not present

## 2022-06-13 LAB — CBC WITH DIFFERENTIAL/PLATELET
Abs Immature Granulocytes: 0.02 10*3/uL (ref 0.00–0.07)
Basophils Absolute: 0 10*3/uL (ref 0.0–0.1)
Basophils Relative: 1 %
Eosinophils Absolute: 0.2 10*3/uL (ref 0.0–0.5)
Eosinophils Relative: 4 %
HCT: 35.4 % — ABNORMAL LOW (ref 39.0–52.0)
Hemoglobin: 10.7 g/dL — ABNORMAL LOW (ref 13.0–17.0)
Immature Granulocytes: 0 %
Lymphocytes Relative: 51 %
Lymphs Abs: 2.8 10*3/uL (ref 0.7–4.0)
MCH: 22 pg — ABNORMAL LOW (ref 26.0–34.0)
MCHC: 30.2 g/dL (ref 30.0–36.0)
MCV: 72.7 fL — ABNORMAL LOW (ref 80.0–100.0)
Monocytes Absolute: 0.4 10*3/uL (ref 0.1–1.0)
Monocytes Relative: 7 %
Neutro Abs: 2 10*3/uL (ref 1.7–7.7)
Neutrophils Relative %: 37 %
Platelets: 231 10*3/uL (ref 150–400)
RBC: 4.87 MIL/uL (ref 4.22–5.81)
RDW: 14.1 % (ref 11.5–15.5)
WBC: 5.5 10*3/uL (ref 4.0–10.5)
nRBC: 0 % (ref 0.0–0.2)

## 2022-06-13 LAB — BASIC METABOLIC PANEL
Anion gap: 6 (ref 5–15)
BUN: 24 mg/dL — ABNORMAL HIGH (ref 6–20)
CO2: 26 mmol/L (ref 22–32)
Calcium: 8.8 mg/dL — ABNORMAL LOW (ref 8.9–10.3)
Chloride: 98 mmol/L (ref 98–111)
Creatinine, Ser: 1.79 mg/dL — ABNORMAL HIGH (ref 0.61–1.24)
GFR, Estimated: 46 mL/min — ABNORMAL LOW (ref >60)
Glucose, Bld: 401 mg/dL — ABNORMAL HIGH (ref 70–99)
Potassium: 4.7 mmol/L (ref 3.5–5.1)
Sodium: 130 mmol/L — ABNORMAL LOW (ref 135–145)

## 2022-06-13 MED ORDER — VANCOMYCIN HCL 1500 MG/300ML IV SOLN
1500.0000 mg | INTRAVENOUS | Status: DC
  Administered 2022-06-14: 1500 mg via INTRAVENOUS
  Filled 2022-06-13: qty 300

## 2022-06-13 MED ORDER — SODIUM CHLORIDE 0.9 % IV SOLN
2.0000 g | Freq: Once | INTRAVENOUS | Status: AC
  Administered 2022-06-13: 2 g via INTRAVENOUS
  Filled 2022-06-13: qty 12.5

## 2022-06-13 MED ORDER — VANCOMYCIN HCL 2000 MG/400ML IV SOLN
2000.0000 mg | Freq: Once | INTRAVENOUS | Status: AC
  Administered 2022-06-14: 2000 mg via INTRAVENOUS
  Filled 2022-06-13: qty 400

## 2022-06-13 NOTE — ED Triage Notes (Signed)
Pt states that he removed the toenail from his L foot. Now foot is swelling and red since yesterday. Arrived in an old postop boot because he is unable to fit foot into his shoes.   H/o DM with neuropathy Denies pain, fever
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
## 2022-06-14 ENCOUNTER — Other Ambulatory Visit (HOSPITAL_COMMUNITY): Payer: Self-pay

## 2022-06-14 ENCOUNTER — Inpatient Hospital Stay (HOSPITAL_COMMUNITY): Payer: Medicare Other

## 2022-06-14 DIAGNOSIS — E11621 Type 2 diabetes mellitus with foot ulcer: Secondary | ICD-10-CM

## 2022-06-14 DIAGNOSIS — R52 Pain, unspecified: Secondary | ICD-10-CM

## 2022-06-14 DIAGNOSIS — E11628 Type 2 diabetes mellitus with other skin complications: Secondary | ICD-10-CM | POA: Diagnosis not present

## 2022-06-14 DIAGNOSIS — L97529 Non-pressure chronic ulcer of other part of left foot with unspecified severity: Secondary | ICD-10-CM

## 2022-06-14 DIAGNOSIS — L089 Local infection of the skin and subcutaneous tissue, unspecified: Secondary | ICD-10-CM | POA: Diagnosis not present

## 2022-06-14 LAB — GLUCOSE, CAPILLARY
Glucose-Capillary: 321 mg/dL — ABNORMAL HIGH (ref 70–99)
Glucose-Capillary: 270 mg/dL — ABNORMAL HIGH (ref 70–99)
Glucose-Capillary: 269 mg/dL — ABNORMAL HIGH (ref 70–99)
Glucose-Capillary: 131 mg/dL — ABNORMAL HIGH (ref 70–99)
Glucose-Capillary: 190 mg/dL — ABNORMAL HIGH (ref 70–99)

## 2022-06-14 LAB — BASIC METABOLIC PANEL
Anion gap: 9 (ref 5–15)
BUN: 24 mg/dL — ABNORMAL HIGH (ref 6–20)
CO2: 27 mmol/L (ref 22–32)
Calcium: 8.8 mg/dL — ABNORMAL LOW (ref 8.9–10.3)
Chloride: 96 mmol/L — ABNORMAL LOW (ref 98–111)
Creatinine, Ser: 1.57 mg/dL — ABNORMAL HIGH (ref 0.61–1.24)
GFR, Estimated: 53 mL/min — ABNORMAL LOW (ref >60)
Glucose, Bld: 324 mg/dL — ABNORMAL HIGH (ref 70–99)
Potassium: 4.5 mmol/L (ref 3.5–5.1)
Sodium: 132 mmol/L — ABNORMAL LOW (ref 135–145)

## 2022-06-14 LAB — SEDIMENTATION RATE: Sed Rate: 46 mm/hr — ABNORMAL HIGH (ref 0–16)

## 2022-06-14 LAB — HEMOGLOBIN A1C
Hgb A1c MFr Bld: 11.7 % — ABNORMAL HIGH (ref 4.8–5.6)
Mean Plasma Glucose: 289.09 mg/dL

## 2022-06-14 MED ORDER — INSULIN ASPART 100 UNIT/ML IJ SOLN
0.0000 [IU] | Freq: Three times a day (TID) | INTRAMUSCULAR | Status: DC
  Administered 2022-06-14: 5 [IU] via SUBCUTANEOUS
  Administered 2022-06-14: 1 [IU] via SUBCUTANEOUS
  Administered 2022-06-14: 5 [IU] via SUBCUTANEOUS
  Administered 2022-06-15: 2 [IU] via SUBCUTANEOUS

## 2022-06-14 MED ORDER — INSULIN ASPART 100 UNIT/ML IJ SOLN: 0.0000 [IU] | Freq: Every day | INTRAMUSCULAR | Status: DC

## 2022-06-14 MED ORDER — HYDRALAZINE HCL 25 MG PO TABS
25.0000 mg | ORAL_TABLET | Freq: Three times a day (TID) | ORAL | Status: DC
  Administered 2022-06-14 – 2022-06-15 (×3): 25 mg via ORAL
  Filled 2022-06-14 (×3): qty 1

## 2022-06-14 MED ORDER — SODIUM CHLORIDE 0.9 % IV SOLN: INTRAVENOUS | Status: DC

## 2022-06-14 MED ORDER — ACETAMINOPHEN 325 MG PO TABS: 650.0000 mg | ORAL_TABLET | Freq: Four times a day (QID) | ORAL | Status: DC | PRN

## 2022-06-14 MED ORDER — AMLODIPINE BESYLATE 5 MG PO TABS
5.0000 mg | ORAL_TABLET | Freq: Every day | ORAL | Status: DC
  Administered 2022-06-14 – 2022-06-15 (×3): 5 mg via ORAL
  Filled 2022-06-14 (×3): qty 1

## 2022-06-14 MED ORDER — ROSUVASTATIN CALCIUM 20 MG PO TABS
40.0000 mg | ORAL_TABLET | Freq: Every day | ORAL | Status: DC
  Administered 2022-06-14 – 2022-06-15 (×2): 40 mg via ORAL
  Filled 2022-06-14 (×2): qty 2

## 2022-06-14 MED ORDER — ISOSORBIDE DINITRATE 10 MG PO TABS
10.0000 mg | ORAL_TABLET | Freq: Three times a day (TID) | ORAL | Status: DC
  Administered 2022-06-14 – 2022-06-15 (×3): 10 mg via ORAL
  Filled 2022-06-14 (×6): qty 1

## 2022-06-14 MED ORDER — ENOXAPARIN SODIUM 40 MG/0.4ML IJ SOSY
40.0000 mg | PREFILLED_SYRINGE | Freq: Every day | INTRAMUSCULAR | Status: DC
  Administered 2022-06-14 – 2022-06-15 (×2): 40 mg via SUBCUTANEOUS
  Filled 2022-06-14 (×2): qty 0.4

## 2022-06-14 MED ORDER — ASPIRIN 81 MG PO TBEC
81.0000 mg | DELAYED_RELEASE_TABLET | Freq: Every day | ORAL | Status: DC
  Administered 2022-06-14 – 2022-06-15 (×2): 81 mg via ORAL
  Filled 2022-06-14 (×2): qty 1

## 2022-06-14 MED ORDER — INFLUENZA VAC SPLIT QUAD 0.5 ML IM SUSY
0.5000 mL | PREFILLED_SYRINGE | INTRAMUSCULAR | Status: AC
  Administered 2022-06-15: 0.5 mL via INTRAMUSCULAR
  Filled 2022-06-14: qty 0.5

## 2022-06-14 MED ORDER — INSULIN ASPART 100 UNIT/ML IJ SOLN
0.0000 [IU] | Freq: Once | INTRAMUSCULAR | Status: AC
  Administered 2022-06-14: 4 [IU] via SUBCUTANEOUS

## 2022-06-14 MED ORDER — SODIUM CHLORIDE 0.9 % IV SOLN
2.0000 g | Freq: Three times a day (TID) | INTRAVENOUS | Status: DC
  Administered 2022-06-14 – 2022-06-15 (×3): 2 g via INTRAVENOUS
  Filled 2022-06-14 (×3): qty 12.5

## 2022-06-14 MED ORDER — ACETAMINOPHEN 650 MG RE SUPP: 650.0000 mg | Freq: Four times a day (QID) | RECTAL | Status: DC | PRN

## 2022-06-14 MED ORDER — INSULIN GLARGINE-YFGN 100 UNIT/ML ~~LOC~~ SOLN
20.0000 [IU] | Freq: Every day | SUBCUTANEOUS | Status: DC
  Administered 2022-06-14 – 2022-06-15 (×2): 20 [IU] via SUBCUTANEOUS
  Filled 2022-06-14 (×2): qty 0.2

## 2022-06-14 NOTE — ED Notes (Signed)
[facility] called for transfer

## 2022-06-14 NOTE — Progress Notes (Signed)
Lower extremity venous duplex has been completed.   Preliminary results in CV Proc.   Steven Cochran 06/14/2022 11:42 AM

## 2022-06-14 NOTE — Inpatient Diabetes Management (Signed)
Inpatient Diabetes Program Recommendations  AACE/ADA: New Consensus Statement on Inpatient Glycemic Control   Target Ranges:  Prepandial:   less than 140 mg/dL      Peak postprandial:   less than 180 mg/dL (1-2 hours)      Critically ill patients:  140 - 180 mg/dL    Latest Reference Range & Units 06/14/22 02:22 06/14/22 07:27 06/14/22 11:26  Glucose-Capillary 70 - 99 mg/dL 321 (H) 270 (H) 269 (H)    Latest Reference Range & Units 09/02/21 06:05 06/14/22 02:48  Hemoglobin A1C 4.8 - 5.6 % 10.9 (H) 11.7 (H)   Review of Glycemic Control  Diabetes history: DM2 Outpatient Diabetes medications: Jardiance 25 mg daily Current orders for Inpatient glycemic control: Semglee 20 uits daily, Novolog 0-9 units TID with meals, Novolog 0-5 units QHS  Inpatient Diabetes Program Recommendations:    Insulin: Please consider ordering Novolog 4 units TID with meals for meal coverage if patient eats at least 50% of meals.  HbgA1C:  A1C 11.7% on 06/14/22 indicating an average glucose of 289 mg/dl over the past 2-3 months. May need to consider discharging patient on insulin for DM control.  Outpatient: At discharge please provide Rx for FreeStyle Libre3 sensors [phone]). If patient is discharged on insulin he prefers to use insulin pens. Lantus SoloStar [phone]) and Novolog Flexpens [phone]) are preferred insulin with insurance and $0 copay. Would also need insulin pen needles [phone]).  NOTE: Patient admitted with diabetic foot infection with initial lab glucose of 401 mg/dl on 06/13/22. Per H&P on 06/13/22, "He does not take any treatment for his blood sugars. He thinks he is diet controlled. He is also having trouble with following up because of no transportation."   In reviewing chart, noted patient was seeing Berkley Harvey, ANP with [facility] and last office visit was 06/28/20.    Spoke with patient over the phone about diabetes and home regimen for diabetes control. Patient reports that he was  seeing Endocrinology but has not seen them in 2 years; patient recently called and get an appointment to get reestablished with them and has an appointment in 2 weeks.  Patient reports that he was taking Jardiance daily until he ran out 1 month ago. Patient states he was using FreeStyle Libre CGM for glucose monitoring but his Rx ran out 6 months ago so he has not been checking glucose.  Patient reports that he works for [employer] and when he had the [place] he was able to check glucose more often and it helped him to eat better. He states his glucose ranged from 140-220's mg/dl when he work [employer].  Patient would like to get back on FreeStyle Libre CGM. Informed patient that our team has some samples of the FreeStyle Libre3 sensors and I would ask attending provider for order to provide patient with samples.  Discussed A1C results (11.7% on 06/14/22) and explained that current A1C indicates an average glucose of 289 mg/dl over the past 2-3 months. Discussed glucose and A1C goals. Discussed importance of checking CBGs and maintaining good CBG control to prevent long-term and short-term complications. Explained how hyperglycemia leads to damage within blood vessels which lead to the common complications seen with uncontrolled diabetes. Stressed to the patient the importance of improving glycemic control to prevent further complications from uncontrolled diabetes. Discussed impact of nutrition, exercise, stress, sickness, and medications on diabetes control.  Patient states that he has been drinking more juice than usual which has likely contributed to hyperglycemia.  Inquired about any prior use of insulin and patient states that he has taken insulin in the past and last used insulin about 1 year ago. Patient reports he used insulin pens in the past. Discussed that he may need to take insulin outpatient especially given A1C and foot infection. Patient reports he is willing to take insulin outpatient if needed and would  prefer to use insulin pens. Reviewed insulin injection administration, injection sites, insulin storage, importance of rotating sites, and steps of using an insulin pen. Patient verbalized understanding of information discussed and reports no further questions at this time related to diabetes. Sent chat message to Dr. Feliberto Gottron regarding discussion with patient; given order to provide patient with samples of FreeStyle Libre3 CGM sensors.  Per MD, patient will not be discharging today. Therefore, will plan to follow up with patient tomorrow and provide samples of FreeStyle Libre3 sensors and review insulin pen.  Thanks, Barnie Alderman, RN, MSN, CDE Diabetes Coordinator Inpatient Diabetes Program [phone] (Team Pager)

## 2022-06-14 NOTE — Progress Notes (Signed)
  Progress Note   Patient: Steven Cochran ULA:[id number] DOB: 10-01-1970 DOA: 06/13/2022     1 DOS: the patient was seen and examined on 06/14/2022   Brief hospital course:  Assessment and Plan: Diabetic toe infection w/ cellulitis  - IV NS 75 cc/hr  - IV cefepime 2 g q8hr  - IV vancomycin 1500 mg daily  - Appreciate orthopedic following - L foot MRI appreciated   DM2 uncontrolled with hyperglycemia (A1c 11.7) - Novolog SS ACHS - Glargine 20 units sq daily  - ASA 81 mg PO daily  - Crestor 40 mg PO daily   HypoNa+ - IV fluids as above   AKI on CKD 3a - Cr improving slowly   Chronic systolic and diastolic heart failure - Monitor   HTN  - Norvasc 5 mg PO daily  - Hydralazine 25 mg PO q8hr  - Isordil 10 mg PO tid   DVT prophylaxis: Lovenox 40 mg sq daily      Subjective: Pt seen and examined at the bedside. L foot is indeed swollen and edematous. Appreciate orthopedic following. Cont IV cefepime and vancomycin.  L foot MRI appreciated.   Physical Exam: Vitals:   06/14/22 0412 06/14/22 0458 06/14/22 0730 06/14/22 1311  BP:  (!) 161/95 (!) 172/97 (!) 149/89  Pulse:  73 72 76  Resp:  18 17 18   Temp:   97.6 F (36.4 C) 97.8 F (36.6 C)  TempSrc:   Oral Oral  SpO2:  97% 100% 100%  Weight: 122.4 kg     Height:       Physical Exam Constitutional:      Appearance: Normal appearance.  HENT:     Head: Normocephalic and atraumatic.     Mouth/Throat:     Mouth: Mucous membranes are moist.  Cardiovascular:     Rate and Rhythm: Normal rate and regular rhythm.  Pulmonary:     Effort: Pulmonary effort is normal.  Abdominal:     General: Abdomen is flat.     Palpations: Abdomen is soft.  Musculoskeletal:        General: Swelling present.     Cervical back: Neck supple.     Comments: L foot swelling/edema   Skin:    General: Skin is warm.  Neurological:     Mental Status: He is alert and oriented to person, place, and time.  Psychiatric:        Mood and Affect: Mood  normal.    Data Reviewed:   Disposition: Status is: Inpatient  Planned Discharge Destination: Home    Time spent: 35 minutes  Author: Lucienne Minks , MD 06/14/2022 1:42 PM  For on call review [email or web address].

## 2022-06-14 NOTE — ED Notes (Signed)
ED TO INPATIENT HANDOFF REPORT  ED Nurse Name and Phone #: Lanette Hampshire [phone]  S Name/Age/Gender Steven Cochran [place] 52 y.o. male Room/Bed: WA14/WA14  Code Status   Code Status: Full Code  Home/SNF/Other Rehab Patient oriented to: self, place, time, and situation Is this baseline? Yes   Triage Complete: Triage complete  Chief Complaint Diabetic foot infection ([place]) [K74.259, L08.9]  Triage Note Pt states that he removed the toenail from his L foot. Now foot is swelling and red since yesterday. Arrived in an old postop boot because he is unable to fit foot into his shoes.   H/o DM with neuropathy Denies pain, fever   Allergies Allergies  Allergen Reactions   Metformin And Related Diarrhea    Level of Care/Admitting Diagnosis ED Disposition     ED Disposition  Admit   Condition  --   Comment  Hospital Area: [place] [[id number]]  Level of Care: Med-Surg [16]  May admit patient to [facility] or [facility] if equivalent level of care is available:: No  Covid Evaluation: Asymptomatic - no recent exposure (last 10 days) testing not required  Diagnosis: Diabetic foot infection [facility]) [[id number]]  Admitting Physician: Barb Merino [[id number]]  Attending Physician: Barb Merino [[id number]]  Certification:: I certify this patient will need inpatient services for at least 2 midnights  Estimated Length of Stay: 3          B Medical/Surgery History Past Medical History:  Diagnosis Date   CHF (congestive heart failure) ([place])    Diabetes mellitus    Hypertension    History reviewed. No pertinent surgical history.   A IV Location/Drains/Wounds Patient Lines/Drains/Airways Status     Active Line/Drains/Airways     Name Placement date Placement time Site Days   Peripheral IV 06/13/22 20 G Anterior;Right Hand 06/13/22  2230  Hand  1            Intake/Output Last 24 hours  Intake/Output Summary (Last 24 hours) at 06/14/2022 0010 Last data filed  at 06/14/2022 0006 Gross per 24 hour  Intake 100 ml  Output --  Net 100 ml    Labs/Imaging Results for orders placed or performed during the hospital encounter of 06/13/22 (from the past 48 hour(s))  CBC with Differential     Status: Abnormal   Collection Time: 06/13/22 10:40 PM  Result Value Ref Range   WBC 5.5 4.0 - 10.5 K/uL   RBC 4.87 4.22 - 5.81 MIL/uL   Hemoglobin 10.7 (L) 13.0 - 17.0 g/dL   HCT 35.4 (L) 39.0 - 52.0 %   MCV 72.7 (L) 80.0 - 100.0 fL   MCH 22.0 (L) 26.0 - 34.0 pg   MCHC 30.2 30.0 - 36.0 g/dL   RDW 14.1 11.5 - 15.5 %   Platelets 231 150 - 400 K/uL   nRBC 0.0 0.0 - 0.2 %   Neutrophils Relative % 37 %   Neutro Abs 2.0 1.7 - 7.7 K/uL   Lymphocytes Relative 51 %   Lymphs Abs 2.8 0.7 - 4.0 K/uL   Monocytes Relative 7 %   Monocytes Absolute 0.4 0.1 - 1.0 K/uL   Eosinophils Relative 4 %   Eosinophils Absolute 0.2 0.0 - 0.5 K/uL   Basophils Relative 1 %   Basophils Absolute 0.0 0.0 - 0.1 K/uL   Immature Granulocytes 0 %   Abs Immature Granulocytes 0.02 0.00 - 0.07 K/uL    Comment: Performed at [facility], [address] [place], [place]  [address]  Basic metabolic panel     Status: Abnormal   Collection Time: 06/13/22 10:40 PM  Result Value Ref Range   Sodium 130 (L) 135 - 145 mmol/L   Potassium 4.7 3.5 - 5.1 mmol/L   Chloride 98 98 - 111 mmol/L   CO2 26 22 - 32 mmol/L   Glucose, Bld 401 (H) 70 - 99 mg/dL    Comment: Glucose reference range applies only to samples taken after fasting for at least 8 hours.   BUN 24 (H) 6 - 20 mg/dL   Creatinine, Ser 1.79 (H) 0.61 - 1.24 mg/dL   Calcium 8.8 (L) 8.9 - 10.3 mg/dL   GFR, Estimated 46 (L) >60 mL/min    Comment: (NOTE) Calculated using the CKD-EPI Creatinine Equation (2021)    Anion gap 6 5 - 15    Comment: Performed at [facility], [place] [address], [place], [place] [address]  Sedimentation rate     Status: Abnormal   Collection Time: 06/13/22 10:40 PM  Result Value Ref  Range   Sed Rate 46 (H) 0 - 16 mm/hr    Comment: Performed at [facility], [place] [address] [place], [place] [address]   DG Foot 2 Views Left  Result Date: 06/13/2022 CLINICAL DATA:  Swelling of the left foot. EXAM: LEFT FOOT - 2 VIEW COMPARISON:  None Available. FINDINGS: No acute fracture or dislocation. There is degenerative changes of the tarsal and tarsometatarsal joints. Vascular calcifications noted. There is soft tissue swelling of the foot. A staple noted over the toe. No soft tissue gas. IMPRESSION: 1. No acute fracture or dislocation. 2. Diffuse soft tissue swelling.  No soft tissue gas. 3. Degenerative changes of the tarsal and tarsometatarsal joints. Electronically Signed   By: Anner Crete M.D.   On: 06/13/2022 22:44    Pending Labs Unresulted Labs (From admission, onward)     Start     Ordered   06/13/22 2347  Aerobic/Anaerobic Culture w Gram Stain (surgical/deep wound)  Once,   URGENT       Question:  Patient immune status  Answer:  Normal   06/13/22 2347   06/13/22 2238  Blood culture (routine x 2)  BLOOD CULTURE X 2,   R (with STAT occurrences)      06/13/22 2237   Signed and Held  Basic metabolic panel  Tomorrow morning,   R        Signed and Held   Signed and Held  Hemoglobin A1c  Tomorrow morning,   R       Comments: To assess prior glycemic control    Signed and Held            Vitals/Pain Today's Vitals   06/13/22 2116 06/13/22 2126 06/13/22 2200 06/13/22 2323  BP: (!) 167/107   (!) 165/101  Pulse: 85   77  Resp: 17   18  Temp: 98.3 F (36.8 C)   98.7 F (37.1 C)  TempSrc: Oral   Oral  SpO2: 100%   100%  Weight:  122 kg    Height:   6\' 2"  (1.88 m)   PainSc:  0-No pain      Isolation Precautions No active isolations  Medications Medications  vancomycin (VANCOREADY) IVPB 2000 mg/400 mL (2,000 mg Intravenous New Bag/Given 06/14/22 0007)  vancomycin (VANCOREADY) IVPB 1500 mg/300 mL (has no administration in time range)   ceFEPIme (MAXIPIME) 2 g in sodium chloride 0.9 % 100 mL IVPB (0 g  Intravenous Stopped 06/14/22 0006)    Mobility walks with device       R Recommendations: See Admitting Provider Note  Report given to:   Additional Notes: Patient is pleasant, ambulates with cane PRN, denies pain at this time.

## 2022-06-14 NOTE — Consult Note (Signed)
ORTHOPAEDIC CONSULTATION  REQUESTING PHYSICIAN: Lucienne Minks, MD  Chief Complaint: Swelling and ulceration Second toe.  HPI: Steven Cochran is a 52 y.o. male who presents with diabetic insensate neuropathy.  Patient states he was trying to trim his toenail when he cut his toe.  Patient has chronic swelling of the toes.  Past Medical History:  Diagnosis Date   CHF (congestive heart failure) (HCC)    Diabetes mellitus    Hypertension    History reviewed. No pertinent surgical history. Social History   Socioeconomic History   Marital status: Married    Spouse name: Not on file   Number of children: Not on file   Years of education: Not on file   Highest education level: Not on file  Occupational History   Not on file  Tobacco Use   Smoking status: Never   Smokeless tobacco: Never  Vaping Use   Vaping Use: Never used  Substance and Sexual Activity   Alcohol use: No   Drug use: No   Sexual activity: Not Currently    Partners: Female    Birth control/protection: None  Other Topics Concern   Not on file  Social History Narrative   Not on file   Social Determinants of Health   Financial Resource Strain: Not on file  Food Insecurity: No Food Insecurity (06/14/2022)   Hunger Vital Sign    Worried About Running Out of Food in the Last Year: Never true    Ran Out of Food in the Last Year: Never true  Transportation Needs: No Transportation Needs (06/14/2022)   PRAPARE - Hydrologist (Medical): No    Lack of Transportation (Non-Medical): No  Physical Activity: Not on file  Stress: Not on file  Social Connections: Not on file   History reviewed. No pertinent family history. - negative except otherwise stated in the family history section Allergies  Allergen Reactions   Metformin And Related Diarrhea   Prior to Admission medications   Medication Sig Start Date End Date Taking? Authorizing Provider  amLODipine (NORVASC) 5 MG tablet Take 1  tablet (5 mg total) by mouth daily. 10/18/21   Suzzanne Cloud, NP  aspirin EC 81 MG EC tablet Take 1 tablet (81 mg total) by mouth daily. Swallow whole. 09/04/21   Thurnell Lose, MD  carvedilol (COREG) 25 MG tablet Take 1 tablet (25 mg total) by mouth 2 (two) times daily. 10/18/21 02/15/22  Suzzanne Cloud, NP  empagliflozin (JARDIANCE) 25 MG TABS tablet Take 1 tablet (25 mg total) by mouth daily. 09/04/21   Thurnell Lose, MD  ENTRESTO 49-51 MG Take 1 tablet by mouth 2 (two) times daily. 09/05/21   Thurnell Lose, MD  rosuvastatin (CRESTOR) 40 MG tablet Take 1 tablet (40 mg total) by mouth daily. 10/18/21   Suzzanne Cloud, NP   DG Foot 2 Views Left  Result Date: 06/13/2022 CLINICAL DATA:  Swelling of the left foot. EXAM: LEFT FOOT - 2 VIEW COMPARISON:  None Available. FINDINGS: No acute fracture or dislocation. There is degenerative changes of the tarsal and tarsometatarsal joints. Vascular calcifications noted. There is soft tissue swelling of the foot. A staple noted over the toe. No soft tissue gas. IMPRESSION: 1. No acute fracture or dislocation. 2. Diffuse soft tissue swelling.  No soft tissue gas. 3. Degenerative changes of the tarsal and tarsometatarsal joints. Electronically Signed   By: Anner Crete M.D.   On: 06/13/2022 22:44   -  pertinent xrays, CT, MRI studies were reviewed and independently interpreted  Positive ROS: All other systems have been reviewed and were otherwise negative with the exception of those mentioned in the HPI and as above.  Physical Exam: General: Alert, no acute distress Psychiatric: Patient is competent for consent with normal mood and affect Lymphatic: No axillary or cervical lymphadenopathy Cardiovascular: No pedal edema Respiratory: No cyanosis, no use of accessory musculature GI: No organomegaly, abdomen is soft and non-tender    Images:  @ENCIMAGES @  Labs:  Lab Results  Component Value Date   HGBA1C 11.7 (H) 06/14/2022   HGBA1C 10.9 (H)  09/02/2021   HGBA1C 9.2 (H) 10/01/2019   ESRSEDRATE 46 (H) 06/13/2022   REPTSTATUS 09/21/2020 FINAL 09/16/2020   CULT  09/16/2020    NO GROWTH 5 DAYS Performed at [facility], [place] [address] [place], [place] [address]     Lab Results  Component Value Date   ALBUMIN 2.8 (L) 09/03/2021   ALBUMIN 3.2 (L) 09/02/2021   ALBUMIN 3.3 (L) 08/30/2021        Latest Ref Rng & Units 06/13/2022   10:40 PM 09/03/2021    5:47 AM 09/02/2021   12:20 AM  CBC EXTENDED  WBC 4.0 - 10.5 K/uL 5.5  5.3    RBC 4.22 - 5.81 MIL/uL 4.87  4.40    Hemoglobin 13.0 - 17.0 g/dL 10.7  9.8  11.6   HCT 39.0 - 52.0 % 35.4  31.5  34.0   Platelets 150 - 400 K/uL 231  280    NEUT# 1.7 - 7.7 K/uL 2.0  2.1    Lymph# 0.7 - 4.0 K/uL 2.8  2.6      Neurologic: Patient does not have protective sensation bilateral lower extremities.   MUSCULOSKELETAL:   Skin: Examination patient has an ulcer over the tip of the second toe left foot.  There is sausage digit swelling of the second and third toe as well as swelling of the great toe.  There is no ascending cellulitis no purulent drainage.  Patient has a palpable dorsalis pedis pulse bilaterally.  Review of the radiographs shows calcification of the arteries out to the toes consistent with severe peripheral vascular disease.  Review of the radiographs shows chronic destructive changes of the tuft of the great toe second toe and third toe.  Assessment: Assessment: Uncontrolled type 2 diabetes with severe peripheral vascular disease with chronic destructive bony changes of the tuft of the great toe second toe and third toe with an ulcer over the tip of the second toe.  Plan: Plan: Will obtain MRI scan of the left foot.  Will reevaluate after the MRI scan.    Thank you for the consult and the opportunity to see [name], MD [facility] [phone] 7:55 AM

## 2022-06-14 NOTE — ED Notes (Signed)
[facility] at bedside for report.

## 2022-06-15 ENCOUNTER — Telehealth: Payer: Self-pay | Admitting: Orthopedic Surgery

## 2022-06-15 ENCOUNTER — Other Ambulatory Visit (HOSPITAL_COMMUNITY): Payer: Self-pay

## 2022-06-15 DIAGNOSIS — L97529 Non-pressure chronic ulcer of other part of left foot with unspecified severity: Secondary | ICD-10-CM | POA: Diagnosis not present

## 2022-06-15 DIAGNOSIS — E11628 Type 2 diabetes mellitus with other skin complications: Secondary | ICD-10-CM | POA: Diagnosis not present

## 2022-06-15 DIAGNOSIS — L089 Local infection of the skin and subcutaneous tissue, unspecified: Secondary | ICD-10-CM | POA: Diagnosis not present

## 2022-06-15 DIAGNOSIS — E11621 Type 2 diabetes mellitus with foot ulcer: Secondary | ICD-10-CM | POA: Diagnosis not present

## 2022-06-15 LAB — CBC
HCT: 33 % — ABNORMAL LOW (ref 39.0–52.0)
Hemoglobin: 10.5 g/dL — ABNORMAL LOW (ref 13.0–17.0)
MCH: 22.7 pg — ABNORMAL LOW (ref 26.0–34.0)
MCHC: 31.8 g/dL (ref 30.0–36.0)
MCV: 71.3 fL — ABNORMAL LOW (ref 80.0–100.0)
Platelets: 235 10*3/uL (ref 150–400)
RBC: 4.63 MIL/uL (ref 4.22–5.81)
RDW: 13.9 % (ref 11.5–15.5)
WBC: 5.5 10*3/uL (ref 4.0–10.5)
nRBC: 0 % (ref 0.0–0.2)

## 2022-06-15 LAB — SEDIMENTATION RATE: Sed Rate: 47 mm/hr — ABNORMAL HIGH (ref 0–16)

## 2022-06-15 LAB — PHOSPHORUS: Phosphorus: 3.3 mg/dL (ref 2.5–4.6)

## 2022-06-15 LAB — APTT: aPTT: 31 seconds (ref 24–36)

## 2022-06-15 LAB — COMPREHENSIVE METABOLIC PANEL
ALT: 30 U/L (ref 0–44)
AST: 31 U/L (ref 15–41)
Albumin: 2.9 g/dL — ABNORMAL LOW (ref 3.5–5.0)
Alkaline Phosphatase: 95 U/L (ref 38–126)
Anion gap: 8 (ref 5–15)
BUN: 24 mg/dL — ABNORMAL HIGH (ref 6–20)
CO2: 26 mmol/L (ref 22–32)
Calcium: 8.9 mg/dL (ref 8.9–10.3)
Chloride: 100 mmol/L (ref 98–111)
Creatinine, Ser: 1.34 mg/dL — ABNORMAL HIGH (ref 0.61–1.24)
GFR, Estimated: >60 mL/min (ref >60)
Glucose, Bld: 188 mg/dL — ABNORMAL HIGH (ref 70–99)
Potassium: 4.4 mmol/L (ref 3.5–5.1)
Sodium: 134 mmol/L — ABNORMAL LOW (ref 135–145)
Total Bilirubin: 0.6 mg/dL (ref 0.3–1.2)
Total Protein: 6.2 g/dL — ABNORMAL LOW (ref 6.5–8.1)

## 2022-06-15 LAB — PROTIME-INR
INR: 0.9 (ref 0.8–1.2)
Prothrombin Time: 12.5 seconds (ref 11.4–15.2)

## 2022-06-15 LAB — GLUCOSE, CAPILLARY
Glucose-Capillary: 186 mg/dL — ABNORMAL HIGH (ref 70–99)
Glucose-Capillary: 259 mg/dL — ABNORMAL HIGH (ref 70–99)

## 2022-06-15 LAB — C-REACTIVE PROTEIN: CRP: <0.5 mg/dL (ref <1.0)

## 2022-06-15 LAB — MAGNESIUM: Magnesium: 1.9 mg/dL (ref 1.7–2.4)

## 2022-06-15 MED ORDER — VANCOMYCIN HCL 1250 MG/250ML IV SOLN
1250.0000 mg | Freq: Two times a day (BID) | INTRAVENOUS | Status: DC
  Administered 2022-06-15: 1250 mg via INTRAVENOUS
  Filled 2022-06-15: qty 250

## 2022-06-15 MED ORDER — FREESTYLE LITE TEST VI STRP
ORAL_STRIP | 5 refills | Status: AC
  Filled 2022-06-15: qty 100, 25d supply, fill #0
  Filled 2022-08-07: qty 100, 25d supply, fill #1

## 2022-06-15 MED ORDER — INSULIN ASPART 100 UNIT/ML IJ SOLN: 3.0000 [IU] | Freq: Three times a day (TID) | INTRAMUSCULAR | Status: DC

## 2022-06-15 MED ORDER — FREESTYLE LANCETS MISC
0 refills | Status: DC
  Filled 2022-06-15: qty 100, 25d supply, fill #0

## 2022-06-15 MED ORDER — FREESTYLE LITE W/DEVICE KIT
PACK | 0 refills | Status: DC
  Filled 2022-06-15: qty 1, 30d supply, fill #0

## 2022-06-15 MED ORDER — VANCOMYCIN HCL 1500 MG/300ML IV SOLN: 1500.0000 mg | Freq: Two times a day (BID) | INTRAVENOUS | Status: DC

## 2022-06-15 MED ORDER — CEPHALEXIN 500 MG PO CAPS
500.0000 mg | ORAL_CAPSULE | Freq: Four times a day (QID) | ORAL | 0 refills | Status: AC
  Filled 2022-06-15: qty 40, 10d supply, fill #0

## 2022-06-15 MED ORDER — DOXYCYCLINE HYCLATE 100 MG PO TABS
100.0000 mg | ORAL_TABLET | Freq: Two times a day (BID) | ORAL | 0 refills | Status: AC
  Filled 2022-06-15: qty 20, 10d supply, fill #0

## 2022-06-15 MED ORDER — INSULIN PEN NEEDLE 32G X 4 MM MISC
0 refills | Status: DC
  Filled 2022-06-15: qty 100, 30d supply, fill #0

## 2022-06-15 MED ORDER — INSULIN GLARGINE 100 UNIT/ML SOLOSTAR PEN
20.0000 [IU] | PEN_INJECTOR | Freq: Every day | SUBCUTANEOUS | 1 refills | Status: DC
  Filled 2022-06-15: qty 6, 30d supply, fill #0

## 2022-06-15 NOTE — Hospital Course (Addendum)
52 y.o.m w/ poorly controlled type 2 diabetes not on any treatment, hypertension, hyperlipidemia, chronic combined heart failure with known ejection fraction 45% severe peripheral neuropathy (does not have any superficial sensation ) presented with pain and swelling of the left foot after pulling out his on left second toe nail. His foot is progressively getting bigger with so he came to the hospital.   ED Course: Hemodynamically stable.Creatinine 1.79 with recent baseline creatinine of 1.4.  Blood glucose is 401. X-ray of the foot with no fracture or dislocation.  No soft tissue gas.  This was diffuse soft tissue swelling around. Chronic degenerative changes of the tarsal and metatarsal joints. ER physician collected blood cultures.  Given dose of vancomycin and cefepime.-case was discussed with orthopedics Dr. Mardelle Matte who recommended admission to [facility] for Dr. Sharol Given for potential surgery on 2/9.  Patient was seen by Dr. Sharol Given advised discharge on oral antibiotics no indication for surgical intervention given no definite osteomyelitis or definite abscess. He has been cleared for discharge home patient feels comfortable going home on oral antibiotics and he will follow-up with Dr. Sharol Given as outpatient.

## 2022-06-15 NOTE — Telephone Encounter (Signed)
Patient has concerns about swelling and how to clean his foot. Please advise.

## 2022-06-15 NOTE — Plan of Care (Signed)

## 2022-06-15 NOTE — Inpatient Diabetes Management (Addendum)
Inpatient Diabetes Program Recommendations  AACE/ADA: New Consensus Statement on Inpatient Glycemic Control   Target Ranges:  Prepandial:   less than 140 mg/dL      Peak postprandial:   less than 180 mg/dL (1-2 hours)      Critically ill patients:  140 - 180 mg/dL    Latest Reference Range & Units 06/14/22 07:27 06/14/22 11:26 06/14/22 16:13 06/14/22 20:22  Glucose-Capillary 70 - 99 mg/dL 270 (H) 269 (H) 131 (H) 190 (H)   Review of Glycemic Control  Diabetes history: DM2 Outpatient Diabetes medications: Jardiance 25 mg daily Current orders for Inpatient glycemic control: Semglee 20 uits daily, Novolog 0-9 units TID with meals, Novolog 0-5 units QHS   Inpatient Diabetes Program Recommendations:     Insulin: Please consider ordering Novolog 3 units TID with meals for meal coverage if patient eats at least 50% of meals.   HbgA1C:  A1C 11.7% on 06/14/22 indicating an average glucose of 289 mg/dl over the past 2-3 months. May need to consider discharging patient on insulin for DM control.   Outpatient: At discharge please provide Rx for FreeStyle Libre3 sensors [phone]). If patient is discharged on insulin he prefers to use insulin pens. Lantus SoloStar [phone]) and Novolog Flexpens [phone]) are preferred insulin with insurance and $0 copay. Would also need insulin pen needles [phone]).  Addendum 06/15/22@11$ :00-Spoke with patient regarding DM control, insulin, and FreeStyle Libre3. Patient has used [employer]; given 2 samples of FreeStyle Libre3 sensors. Discussed differences in [place] and asked patient to download the [employer] app to use with the sensors. Patient plans to apply FreeStyle Libre3 at home after discharge. Reviewed and demonstrated how to use an insulin pen. Patient was able to successfully demonstrate how to use an insulin pen. Discussed current insulin regimen and informed patient that Lantus and Novolog insulin pens are covered under his insurance (if  discharged on long and/or short acting insulin). Patient states that he plans to make dietary changes and stop drinking excessive amounts of juice. Patient states he has never had any hypoglycemia that he can recall. Discussed hypoglycemia along with treatment. Encouraged patient to get glucose tablets when he goes to pick up discharge medications from pharmacy. Patient works with [employer] and reports that he can not have his phone on his person while working but keeps it in a locker and will plan to check glucose in [employer] app during his breaks. Stressed importance of improving DM control especially given foot infection and to decrease risk of further complications from uncontrolled DM. Patient verbalized understanding of information and has no questions at this time.  Thanks, Barnie Alderman, RN, MSN, [place] Diabetes Coordinator Inpatient Diabetes Program [phone] (Team Pager from 8am to [place])
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
## 2022-06-15 NOTE — Discharge Summary (Signed)
Physician Discharge Summary  Guilherme Chappel [id number] DOB: [birth year] DOA: 06/13/2022  PCP: [name], NP  Admit date: 06/13/2022 Discharge date: 06/15/2022 Recommendations for Outpatient Follow-up:  Follow up with PCP, Dr Sharol Given in 1 week-call for appointment Please obtain BMP/CBC in one week Check blood sugar closely at home q. [facility]  Discharge Dispo: Home Discharge Condition: Stable Code Status:   Code Status: Full Code Diet recommendation:  Diet Order             Diet Carb Modified Fluid consistency: Thin; Room service appropriate? Yes  Diet effective now                    Brief/Interim Summary: 52 y.o.m w/ poorly controlled type 2 diabetes not on any treatment, hypertension, hyperlipidemia, chronic combined heart failure with known ejection fraction 45% severe peripheral neuropathy (does not have any superficial sensation ) presented with pain and swelling of the left foot after pulling out his on left second toe nail. His foot is progressively getting bigger with so he came to the hospital.   ED Course: Hemodynamically stable.Creatinine 1.79 with recent baseline creatinine of 1.4.  Blood glucose is 401. X-ray of the foot with no fracture or dislocation.  No soft tissue gas.  This was diffuse soft tissue swelling around. Chronic degenerative changes of the tarsal and metatarsal joints. ER physician collected blood cultures.  Given dose of vancomycin and cefepime.-case was discussed with orthopedics Dr. Mardelle Matte who recommended admission to [facility] for Dr. Sharol Given for potential surgery on 2/9.  Patient was seen by Dr. Sharol Given advised discharge on oral antibiotics no indication for surgical intervention given no definite osteomyelitis or definite abscess. He has been cleared for discharge home patient feels comfortable going home on oral antibiotics and he will follow-up with Dr. Sharol Given as outpatient.   Discharge Diagnoses:  Principal Problem:   Diabetic foot  infection ([place]) Active Problems:   HTN (hypertension)   DM2 (diabetes mellitus, type 2) (HCC)   Chronic systolic CHF (congestive heart failure) (HCC)   Diabetic ulcer of left foot associated with type 2 diabetes mellitus (HCC)  Diabetic toe infection with cellulitis: S/P MRI of the left foot-findings suspicious for cellulitis, no strong evidence of osteomyelitis or abscesses. Managed with IV antibiotics seen by Dr. Sharol Given no need for surgical intervention and okay to discharge on oral antibiotics-discussed with Sharol Given and pharmacy will discharge on doxycycline and Keflex.  Type 2 diabetes mellitus with uncontrolled hyperglycemia HbA1c 11.6, poor compliance, not on any OHA/insulin PTA.  Seen by diabetes coordinator, started on long-acting insulin> insulin teaching diabetic education and hypoglycemia education.On aspirin 81 Crestor 40.  Blood sugar fairly controlled-will need close outpatient follow-up and diabetic management. Recent Labs  Lab 06/14/22 0248 06/14/22 0727 06/14/22 1126 06/14/22 1613 06/14/22 2022 06/15/22 0758 06/15/22 1110  GLUCAP  --    < > 269* 131* 190* 186* 259*  HGBA1C 11.7*  --   --   --   --   --   --    < > = values in this interval not displayed.    AKI on CKD stage IIIa baseline creatinine close to 1.4: Creatinine improved to baseline with IV fluids. Hyponatremia improved Microcytic anemia hemoglobin stable at 10 g range-need outpatient follow-up and age-appropriate cancer screening Hypertension stable, continue Norvasc, resume home coreg on d/c Chronic systolic and diastolic CHF: Last echo showed EF 45-50%, with global hypokinesia of left ventricle, G1 DD on 09/02/2021. compensated given IV  fluids for AKI and discontinued. Cont his home meds- no longer taking entresto, f/u cardiology  Consults: Dr Sharol Given  Subjective: Alert awake oriented resting comfortably some pain on his left foot no new complaints.  Discharge Exam: Vitals:   06/15/22 0455 06/15/22 0800   BP: (!) 140/96 (!) 161/73  Pulse: 80 78  Resp:  18  Temp: 98.3 F (36.8 C) 98.2 F (36.8 C)  SpO2: 99% 98%   General: Pt is alert, awake, not in acute distress Cardiovascular: RRR, S1/S2 +, no rubs, no gallops Respiratory: CTA bilaterally, no wheezing, no rhonchi Abdominal: Soft, NT, ND, bowel sounds + Extremities: foot edema,mildly tender left foot, no erythema,no cyanosis  Discharge Instructions  Discharge Instructions     (HEART FAILURE PATIENTS) Call MD:  Anytime you have any of the following symptoms: 1) 3 pound weight gain in 24 hours or 5 pounds in 1 week 2) shortness of breath, with or without a dry hacking cough 3) swelling in the hands, feet or stomach 4) if you have to sleep on extra pillows at night in order to breathe.   Complete by: As directed    Discharge instructions   Complete by: As directed    Follow-up with your PCP for diabetic management see instruction for diabetes Check blood sugar 3 times a day and bedtime at home. If blood sugar running above 200 less than 70 please call your MD to adjust insulin. If blood sugars running less 100 do not use insulin and call MD. If you noticed signs and symptoms of hypoglycemia or low blood sugar like jitteriness, confusion, thirst, tremor, sweating- Check blood sugar, drink sugary drink/biscuits/sweets to increase sugar level and call MD or return to ER.  Please call call MD or return to ER for similar or worsening recurring problem that brought you to hospital or if any fever,nausea/vomiting,abdominal pain, uncontrolled pain, chest pain,  shortness of breath or any other alarming symptoms.  Please follow-up your doctor as instructed in a week time and call the office for appointment.  Please avoid alcohol, smoking, or any other illicit substance and maintain healthy habits including taking your regular medications as prescribed.  You were cared for by a hospitalist during your hospital stay. If you have any questions  about your discharge medications or the care you received while you were in the hospital after you are discharged, you can call the unit and ask to speak with the hospitalist on call if the hospitalist that took care of you is not available.  Once you are discharged, your primary care physician will handle any further medical issues. Please note that NO REFILLS for any discharge medications will be authorized once you are discharged, as it is imperative that you return to your primary care physician (or establish a relationship with a primary care physician if you do not have one) for your aftercare needs so that they can reassess your need for medications and monitor your lab values   Discharge wound care:   Complete by: As directed    Continue dressing and avoid pressure, use diabetic shoes   Increase activity slowly   Complete by: As directed       Allergies as of 06/15/2022       Reactions   Metformin And Related Diarrhea        Medication List     TAKE these medications    amLODipine 5 MG tablet Commonly known as: Norvasc Take 1 tablet (5 mg total) by mouth daily.  aspirin EC 81 MG tablet Take 1 tablet (81 mg total) by mouth daily. Swallow whole.   carvedilol 25 MG tablet Commonly known as: COREG Take 1 tablet (25 mg total) by mouth 2 (two) times daily.   cephALEXin 500 MG capsule Commonly known as: KEFLEX Take 1 capsule (500 mg total) by mouth 4 (four) times daily for 10 days.   doxycycline 100 MG tablet Commonly known as: ADOXA Take 1 tablet (100 mg total) by mouth 2 (two) times daily for 10 days.   empagliflozin 25 MG Tabs tablet Commonly known as: JARDIANCE Take 1 tablet (25 mg total) by mouth daily.   Entresto 49-51 MG Generic drug: sacubitril-valsartan Take 1 tablet by mouth 2 (two) times daily.   FREESTYLE LITE test strip Generic drug: glucose blood Monitor blood sugar at home   insulin glargine 100 UNIT/ML Solostar Pen Commonly known as:  LANTUS Inject 20 Units into the skin daily.   rosuvastatin 40 MG tablet Commonly known as: CRESTOR Take 1 tablet (40 mg total) by mouth daily.               Discharge Care Instructions  (From admission, onward)           Start     Ordered   06/15/22 0000  Discharge wound care:       Comments: Continue dressing and avoid pressure, use diabetic shoes   06/15/22 1247            Follow-up Information     Newt Minion, MD Follow up in 1 week(s).   Specialty: Orthopedic Surgery Contact information: [place] [address] [phone]         [name], NP Follow up in 1 week(s).   Specialty: Internal Medicine Contact information: [place] [address] [phone]                Allergies  Allergen Reactions   Metformin And Related Diarrhea    The results of significant diagnostics from this hospitalization (including imaging, microbiology, ancillary and laboratory) are listed below for reference.    Microbiology: Recent Results (from the past 240 hour(s))  Blood culture (routine x 2)     Status: None (Preliminary result)   Collection Time: 06/13/22 11:19 PM   Specimen: BLOOD LEFT HAND  Result Value Ref Range Status   Specimen Description BLOOD LEFT HAND  Final   Special Requests   Final    BOTTLES DRAWN AEROBIC ONLY Blood Culture results may not be optimal due to an inadequate volume of blood received in culture bottles   Culture   Final    NO GROWTH 1 DAY Performed at [facility], [place] [address], [place], [place] [address]    Report Status PENDING  Incomplete    Procedures/Studies: VAS [place] LOWER EXTREMITY VENOUS (DVT) (ONLY [facility] & [facility])  Result Date: 06/14/2022  Lower Venous DVT Study Patient Name:  [facility]  Date of Exam:   06/14/2022 Medical Rec #: [id number]      Accession #:    [id number] Date of Birth: 11-Jul-1970      Patient Gender: M Patient Age:   52 years Exam  Location:  [facility] Procedure:      VAS [place] LOWER EXTREMITY VENOUS (DVT) Referring Phys: Barb Merino --------------------------------------------------------------------------------  Indications: Pain.  Comparison Study: 09/22/20 prior Performing Technologist: Archie Patten RVS  Examination Guidelines: A complete evaluation includes B-mode imaging, spectral  Doppler, color Doppler, and power Doppler as needed of all accessible portions of each vessel. Bilateral testing is considered an integral part of a complete examination. Limited examinations for reoccurring indications may be performed as noted. The reflux portion of the exam is performed with the patient in reverse Trendelenburg.  +-----+---------------+---------+-----------+----------+--------------+ RIGHTCompressibilityPhasicitySpontaneityPropertiesThrombus Aging +-----+---------------+---------+-----------+----------+--------------+ CFV  Full           Yes      Yes                                 +-----+---------------+---------+-----------+----------+--------------+   +--------+---------------+---------+-----------+----------+--------------------+ LEFT    CompressibilityPhasicitySpontaneityPropertiesThrombus Aging       +--------+---------------+---------+-----------+----------+--------------------+ CFV     Full           Yes      Yes                                       +--------+---------------+---------+-----------+----------+--------------------+ SFJ     Full                                                              +--------+---------------+---------+-----------+----------+--------------------+ FV Prox Full                                                              +--------+---------------+---------+-----------+----------+--------------------+ FV Mid  Full                                                               +--------+---------------+---------+-----------+----------+--------------------+ FV      Full                                                              Distal                                                                    +--------+---------------+---------+-----------+----------+--------------------+ PFV     Full                                                              +--------+---------------+---------+-----------+----------+--------------------+ POP     Full  Yes      Yes                                       +--------+---------------+---------+-----------+----------+--------------------+ PTV     Full                                                              +--------+---------------+---------+-----------+----------+--------------------+ PERO                   Yes      Yes                  patent by color                                                           doppler              +--------+---------------+---------+-----------+----------+--------------------+     Summary: RIGHT: - No evidence of common femoral vein obstruction.  LEFT: - There is no evidence of deep vein thrombosis in the lower extremity.  - No cystic structure found in the popliteal fossa.  *See table(s) above for measurements and observations.    Preliminary    MR FOOT LEFT WO CONTRAST  Result Date: 06/14/2022 CLINICAL DATA:  Left foot pain and swelling after recent removal of the left 2nd toenail. Poorly controlled type 2 diabetes with peripheral neuropathy. EXAM: MRI OF THE LEFT FOOT WITHOUT CONTRAST TECHNIQUE: Multiplanar, multisequence MR imaging of the left forefoot was performed. No intravenous contrast was administered. COMPARISON:  Radiographs 06/13/2022. FINDINGS: Bones/Joint/Cartilage No cortical destruction or suspicious marrow changes on T1 weighted images. Evaluation of the toes is limited by clawtoe deformities of the 2nd through 5th digits. Suggested  marrow hyperintensity within the distal 2nd phalanx on the coronal and sagittal inversion recovery images. No significant joint effusions. There are mild degenerative changes in the midfoot and at the 1st metatarsophalangeal joint. Ligaments Intact Lisfranc ligament. Intact collateral ligaments of the metatarsophalangeal joints. Muscles and Tendons Nonspecific T2 hyperintensity throughout the forefoot musculature, likely related to underlying diabetes. The flexor and extensor tendons appear intact, without significant tenosynovitis. There is heterogeneous thickening and laxity of the distal aspect of the plantar aponeurosis medially at the level of the 1st metatarsal. The flexor hallucis longus tendon appears unremarkable. Soft tissues Severe subcutaneous edema throughout the dorsal aspect of the foot without evidence of focal fluid collection on noncontrast imaging. There is susceptibility artifact along the plantar and medial aspect of the interphalangeal joint of the great toe corresponding with the metallic staple visualized on the recent radiographs. IMPRESSION: 1. Severe subcutaneous edema throughout the dorsal aspect of the foot without evidence of focal fluid collection on noncontrast imaging. Findings likely represent superficial soft tissue infection (cellulitis). 2. Suggested marrow hyperintensity within the distal 2nd phalanx on the inversion recovery images, likely reactive. No cortical destruction, joint effusion or T1 marrow signal changes to strongly suggest osteomyelitis. 3. Nonspecific T2 hyperintensity throughout the forefoot musculature, likely related to underlying diabetes. 4. Heterogeneous thickening  and laxity of the distal aspect of the plantar aponeurosis medially at the level of the 1st metatarsal suggesting disruption. 5. Metallic staple along the plantar and medial aspect of the interphalangeal joint of the great toe, unchanged from recent radiographs, presumed iatrogenic. Electronically  Signed   By: Richardean Sale M.D.   On: 06/14/2022 09:48   DG Foot 2 Views Left  Result Date: 06/13/2022 CLINICAL DATA:  Swelling of the left foot. EXAM: LEFT FOOT - 2 VIEW COMPARISON:  None Available. FINDINGS: No acute fracture or dislocation. There is degenerative changes of the tarsal and tarsometatarsal joints. Vascular calcifications noted. There is soft tissue swelling of the foot. A staple noted over the toe. No soft tissue gas. IMPRESSION: 1. No acute fracture or dislocation. 2. Diffuse soft tissue swelling.  No soft tissue gas. 3. Degenerative changes of the tarsal and tarsometatarsal joints. Electronically Signed   By: Anner Crete M.D.   On: 06/13/2022 22:44   Labs: BNP (last 3 results) No results for input(s): "BNP" in the last 8760 hours. Basic Metabolic Panel: Recent Labs  Lab 06/13/22 2240 06/14/22 0248 06/15/22 0322  NA 130* 132* 134*  K 4.7 4.5 4.4  CL 98 96* 100  CO2 26 27 26  $ GLUCOSE 401* 324* 188*  BUN 24* 24* 24*  CREATININE 1.79* 1.57* 1.34*  CALCIUM 8.8* 8.8* 8.9  MG  --   --  1.9  PHOS  --   --  3.3   Liver Function Tests: Recent Labs  Lab 06/15/22 0322  AST 31  ALT 30  ALKPHOS 95  BILITOT 0.6  PROT 6.2*  ALBUMIN 2.9*   No results for input(s): "LIPASE", "AMYLASE" in the last 168 hours. No results for input(s): "AMMONIA" in the last 168 hours. CBC: Recent Labs  Lab 06/13/22 2240 06/15/22 0322  WBC 5.5 5.5  NEUTROABS 2.0  --   HGB 10.7* 10.5*  HCT 35.4* 33.0*  MCV 72.7* 71.3*  PLT 231 235   Recent Labs  Lab 06/14/22 1126 06/14/22 1613 06/14/22 2022 06/15/22 0758 06/15/22 1110  GLUCAP 269* 131* 190* 186* 259*   Recent Labs    06/14/22 0248  HGBA1C 11.7*      Component Value Date/Time   COLORURINE YELLOW 10/15/2018 2002   APPEARANCEUR CLOUDY (A) 10/15/2018 2002   LABSPEC 1.022 10/15/2018 2002   PHURINE 5.0 10/15/2018 2002   GLUCOSEU >=500 (A) 10/15/2018 2002   HGBUR SMALL (A) 10/15/2018 2002   BILIRUBINUR NEGATIVE  10/15/2018 2002   KETONESUR 20 (A) 10/15/2018 2002   PROTEINUR >=300 (A) 10/15/2018 2002   UROBILINOGEN 0.2 06/01/2013 0306   NITRITE NEGATIVE 10/15/2018 2002   LEUKOCYTESUR NEGATIVE 10/15/2018 2002   Sepsis Labs Recent Labs  Lab 06/13/22 2240 06/15/22 0322  WBC 5.5 5.5   Microbiology Recent Results (from the past 240 hour(s))  Blood culture (routine x 2)     Status: None (Preliminary result)   Collection Time: 06/13/22 11:19 PM   Specimen: BLOOD LEFT HAND  Result Value Ref Range Status   Specimen Description BLOOD LEFT HAND  Final   Special Requests   Final    BOTTLES DRAWN AEROBIC ONLY Blood Culture results may not be optimal due to an inadequate volume of blood received in culture bottles   Culture   Final    NO GROWTH 1 DAY Performed at [facility], [place] [address] [place], [place] [address]    Report Status PENDING  Incomplete   Time coordinating discharge: 25  minutes  SIGNED: Antonieta Pert, MD  [facility] 06/15/2022, 12:50 PM  If 7PM-7AM, please contact night-coverage [email or web address]

## 2022-06-15 NOTE — Telephone Encounter (Signed)
Called pt back, no answer. Looks like he is still in the hospital and has not been discharged yet.

## 2022-06-15 NOTE — Progress Notes (Signed)
Patient ID: Steven Cochran, male   DOB: April 02, 1971, 52 y.o.   MRN: [id number] Review of the MRI scan shows a small area of edema tuft of the second toe with no definite osteomyelitis no definite abscess.  Do not see an indication for surgical intervention at this time.  I will follow-up as an outpatient.  Could discharge on oral antibiotics.

## 2022-06-18 ENCOUNTER — Telehealth: Payer: Self-pay | Admitting: Orthopedic Surgery

## 2022-06-18 NOTE — Telephone Encounter (Signed)
Patient states he needs a Work note (works part-time at [employer]) until about the 23rd when he finishes up his medication, he is unsure when he can go back to work I would assume it would be determined when he come back in. He was released from the hospital this weekend, he would still like to know about his foot swelling please advise.

## 2022-06-18 NOTE — Telephone Encounter (Signed)
Pt informed. He says that he is contacting [employer] and they will fax forms over to [place] to fill out.

## 2022-06-18 NOTE — Telephone Encounter (Signed)
Pt called again this morning and another message was started. Will close this one out as the other one was sent to Dr. Sharol Given about a work note.

## 2022-06-19 LAB — CULTURE, BLOOD (ROUTINE X 2): Culture: NO GROWTH

## 2022-06-22 ENCOUNTER — Encounter: Payer: Self-pay | Admitting: Family

## 2022-06-22 ENCOUNTER — Ambulatory Visit (INDEPENDENT_AMBULATORY_CARE_PROVIDER_SITE_OTHER): Payer: 59 | Admitting: Family

## 2022-06-22 DIAGNOSIS — L97521 Non-pressure chronic ulcer of other part of left foot limited to breakdown of skin: Secondary | ICD-10-CM | POA: Diagnosis not present

## 2022-06-22 DIAGNOSIS — E11628 Type 2 diabetes mellitus with other skin complications: Secondary | ICD-10-CM

## 2022-06-22 DIAGNOSIS — L089 Local infection of the skin and subcutaneous tissue, unspecified: Secondary | ICD-10-CM

## 2022-06-22 NOTE — Progress Notes (Signed)
   Post-Op Visit Note   Patient: Steven Cochran           Date of Birth: October 10, 1970           MRN: [id number] Visit Date: 06/22/2022 PCP: [name], NP  Chief Complaint: No chief complaint on file.   HPI:  HPI The patient is a 52 year old gentleman who is seen in hospitalization follow-up he was admitted for concern of deep infection to a wound on his toe that he received after trying to cut his toenails.  MRI scan during admission was reassuring no sign of abscess or osteomyelitis.  He was discharged on Keflex and doxycycline Ortho Exam On examination of the left foot the second toe is chronically swollen there is no erythema or warmth there is ulceration to the distal tip which is 5 mm in diameter there is no active drainage  Visit Diagnoses: No diagnosis found.  Plan: Continue compression stockings daily Dial soap cleansing and dressing changes close monitoring of the toe plan follow-up in the office in 2 to 3 weeks for evaluation.  The patient feels ready to return to work given a note today that he may resume Follow-Up Instructions: No follow-ups on file.   Imaging: No results found.  Orders:  No orders of the defined types were placed in this encounter.  No orders of the defined types were placed in this encounter.    PMFS History: Patient Active Problem List   Diagnosis Date Noted   Diabetic ulcer of left foot associated with type 2 diabetes mellitus ([place]) 06/14/2022   Diabetic foot infection ([place]) 06/13/2022   Acute ischemic stroke ([place]) 09/02/2021   DM2 (diabetes mellitus, type 2) ([place]) 0000000   Chronic systolic CHF (congestive heart failure) ([place]) 09/02/2021   Hyperkalemia 10/01/2019   HTN (hypertension) 06/30/2012   Past Medical History:  Diagnosis Date   CHF (congestive heart failure) ([place])    Diabetes mellitus    Hypertension     No family history on file.  No past surgical history on file. Social History   Occupational History   Not  on file  Tobacco Use   Smoking status: Never   Smokeless tobacco: Never  Vaping Use   Vaping Use: Never used  Substance and Sexual Activity   Alcohol use: No   Drug use: No   Sexual activity: Not Currently    Partners: Female    Birth control/protection: None

## 2022-07-04 ENCOUNTER — Ambulatory Visit: Payer: Medicare Other | Admitting: Internal Medicine

## 2022-07-04 NOTE — Progress Notes (Deleted)
  CC: Establishment of Care  HPI:  [name] is a 52 y.o. male with a past medical history of HTN, HLD, DMII, CHF, hx of CVA and presenting to the clinic today for establishment of care. Pt was admitted on 02/07-02/09 for diabetic foot infection.  Please see problem based assessment and plan for additional details.  Past Medical History:  Diagnosis Date   CHF (congestive heart failure) (HCC)    Diabetes mellitus    Hypertension      Current Outpatient Medications (Endocrine & Metabolic):    empagliflozin (JARDIANCE) 25 MG TABS tablet, Take 1 tablet (25 mg total) by mouth daily.   insulin glargine (LANTUS) 100 UNIT/ML Solostar Pen, Inject 20 Units into the skin daily.  Current Outpatient Medications (Cardiovascular):    amLODipine (NORVASC) 5 MG tablet, Take 1 tablet (5 mg total) by mouth daily.   carvedilol (COREG) 25 MG tablet, Take 1 tablet (25 mg total) by mouth 2 (two) times daily.   ENTRESTO 49-51 MG, Take 1 tablet by mouth 2 (two) times daily.   rosuvastatin (CRESTOR) 40 MG tablet, Take 1 tablet (40 mg total) by mouth daily.   Current Outpatient Medications (Analgesics):    aspirin EC 81 MG EC tablet, Take 1 tablet (81 mg total) by mouth daily. Swallow whole.   Current Outpatient Medications (Other):    Blood Glucose Monitoring Suppl (FREESTYLE LITE) w/Device KIT, Use as directed   glucose blood (FREESTYLE LITE) test strip, Monitor blood sugar at home   Insulin Pen Needle 32G X 4 MM MISC, use with Lantus   Lancets (FREESTYLE) lancets, Use a directed  No family history on file.  Social History: ***  Review of Systems: ROS negative except for what is noted on the assessment and plan.  There were no vitals filed for this visit.   Physical Exam: General: Well appearing ***, NAD HENT: normocephalic, atraumatic EYES: conjunctiva non-erythematous, no scleral icterus CV: regular rate, normal rhythm, no murmurs, rubs, gallops. Pulmonary: normal work of breathing  on RA, lungs clear to auscultation, no rales, wheezes, rhonchi Abdominal: non-distended, soft, non-tender to palpation, normal BS Skin: Warm and dry, no rashes or lesions Neurological: MS: awake, alert and oriented x3, normal speech and fund of knowledge Motor: moves all extremities antigravity Psych: normal affect    Assessment & Plan:   No problem-specific Assessment & Plan notes found for this encounter.   See Encounters Tab for problem based charting.  Patient discussed with Dr. {NAMES:[id number]::"Guilloud","Hoffman","Mullen","Narendra","Vincent","Machen","Lau","Hatcher"} Steven Schuller, MD Tillie Rung. [facility], PGY-2   DMII Started on glargine 20 units. On Jardiance 25 mg qd. A1c 11.7  HTN/CHF Was on amlodipine and metoprolol. Was started on Rosenberg, Coreg, Jardiance during hospitalization.   CKDII/AKI Elevated creatinine noted. Will repeat BMP.   Lipid panel  Last LDL was 173. Goal less than 70. Will recheck lipid panel.

## 2022-07-06 ENCOUNTER — Telehealth: Payer: Self-pay | Admitting: Orthopedic Surgery

## 2022-07-06 NOTE — Telephone Encounter (Signed)
[employer] forms received. To [employer].

## 2022-07-12 ENCOUNTER — Other Ambulatory Visit: Payer: Self-pay

## 2022-07-12 ENCOUNTER — Ambulatory Visit (INDEPENDENT_AMBULATORY_CARE_PROVIDER_SITE_OTHER): Payer: Medicare Other

## 2022-07-12 ENCOUNTER — Encounter: Payer: Self-pay | Admitting: Internal Medicine

## 2022-07-12 ENCOUNTER — Other Ambulatory Visit (HOSPITAL_COMMUNITY): Payer: Self-pay

## 2022-07-12 ENCOUNTER — Ambulatory Visit (INDEPENDENT_AMBULATORY_CARE_PROVIDER_SITE_OTHER): Payer: Medicare Other | Admitting: Internal Medicine

## 2022-07-12 VITALS — BP 189/91 | HR 81 | Temp 97.7°F | Ht 74.0 in | Wt 285.7 lb

## 2022-07-12 VITALS — HR 88 | Temp 97.7°F | Ht 74.0 in | Wt 285.7 lb

## 2022-07-12 DIAGNOSIS — E1159 Type 2 diabetes mellitus with other circulatory complications: Secondary | ICD-10-CM | POA: Diagnosis not present

## 2022-07-12 DIAGNOSIS — Z299 Encounter for prophylactic measures, unspecified: Secondary | ICD-10-CM

## 2022-07-12 DIAGNOSIS — Z1159 Encounter for screening for other viral diseases: Secondary | ICD-10-CM | POA: Diagnosis not present

## 2022-07-12 DIAGNOSIS — E1149 Type 2 diabetes mellitus with other diabetic neurological complication: Secondary | ICD-10-CM

## 2022-07-12 DIAGNOSIS — I6310 Cerebral infarction due to embolism of unspecified precerebral artery: Secondary | ICD-10-CM | POA: Diagnosis not present

## 2022-07-12 DIAGNOSIS — E1169 Type 2 diabetes mellitus with other specified complication: Secondary | ICD-10-CM | POA: Diagnosis not present

## 2022-07-12 DIAGNOSIS — E785 Hyperlipidemia, unspecified: Secondary | ICD-10-CM | POA: Diagnosis not present

## 2022-07-12 DIAGNOSIS — I10 Essential (primary) hypertension: Secondary | ICD-10-CM | POA: Diagnosis not present

## 2022-07-12 DIAGNOSIS — I110 Hypertensive heart disease with heart failure: Secondary | ICD-10-CM

## 2022-07-12 DIAGNOSIS — L97529 Non-pressure chronic ulcer of other part of left foot with unspecified severity: Secondary | ICD-10-CM | POA: Diagnosis not present

## 2022-07-12 DIAGNOSIS — Z1211 Encounter for screening for malignant neoplasm of colon: Secondary | ICD-10-CM

## 2022-07-12 DIAGNOSIS — I5022 Chronic systolic (congestive) heart failure: Secondary | ICD-10-CM

## 2022-07-12 DIAGNOSIS — D509 Iron deficiency anemia, unspecified: Secondary | ICD-10-CM | POA: Diagnosis not present

## 2022-07-12 DIAGNOSIS — E11621 Type 2 diabetes mellitus with foot ulcer: Secondary | ICD-10-CM

## 2022-07-12 DIAGNOSIS — Z0000 Encounter for general adult medical examination without abnormal findings: Secondary | ICD-10-CM

## 2022-07-12 DIAGNOSIS — Z794 Long term (current) use of insulin: Secondary | ICD-10-CM

## 2022-07-12 DIAGNOSIS — Z7985 Long-term (current) use of injectable non-insulin antidiabetic drugs: Secondary | ICD-10-CM

## 2022-07-12 MED ORDER — INSULIN PEN NEEDLE 32G X 4 MM MISC
0 refills | Status: DC
  Filled 2022-07-12: qty 100, 90d supply, fill #0

## 2022-07-12 MED ORDER — ENTRESTO 49-51 MG PO TABS
1.0000 | ORAL_TABLET | Freq: Two times a day (BID) | ORAL | 2 refills | Status: DC
  Filled 2022-07-12: qty 60, 30d supply, fill #0
  Filled 2022-08-07: qty 60, 30d supply, fill #1

## 2022-07-12 MED ORDER — AMLODIPINE BESYLATE 5 MG PO TABS
5.0000 mg | ORAL_TABLET | Freq: Every day | ORAL | 3 refills | Status: DC
  Filled 2022-07-12: qty 30, 30d supply, fill #0
  Filled 2022-08-07: qty 30, 30d supply, fill #1

## 2022-07-12 MED ORDER — EMPAGLIFLOZIN 25 MG PO TABS
25.0000 mg | ORAL_TABLET | Freq: Every day | ORAL | 0 refills | Status: DC
  Filled 2022-07-12: qty 30, 30d supply, fill #0

## 2022-07-12 MED ORDER — OZEMPIC (1 MG/DOSE) 4 MG/3ML ~~LOC~~ SOPN
0.2500 mg | PEN_INJECTOR | SUBCUTANEOUS | 0 refills | Status: DC
  Filled 2022-07-12: qty 3, 56d supply, fill #0

## 2022-07-12 MED ORDER — CARVEDILOL 25 MG PO TABS
25.0000 mg | ORAL_TABLET | Freq: Two times a day (BID) | ORAL | 3 refills | Status: DC
  Filled 2022-07-12: qty 60, 30d supply, fill #0
  Filled 2022-08-07: qty 60, 30d supply, fill #1

## 2022-07-12 MED ORDER — INSULIN GLARGINE 100 UNIT/ML SOLOSTAR PEN
20.0000 [IU] | PEN_INJECTOR | Freq: Every day | SUBCUTANEOUS | 1 refills | Status: DC
  Filled 2022-07-12: qty 6, 30d supply, fill #0
  Filled 2022-08-07: qty 6, 30d supply, fill #1

## 2022-07-12 MED ORDER — ROSUVASTATIN CALCIUM 40 MG PO TABS
40.0000 mg | ORAL_TABLET | Freq: Every day | ORAL | 3 refills | Status: DC
  Filled 2022-07-12: qty 30, 30d supply, fill #0
  Filled 2022-08-07: qty 30, 30d supply, fill #1

## 2022-07-12 NOTE — Patient Instructions (Signed)
Health Maintenance, Male Adopting a healthy lifestyle and getting preventive care are important in promoting health and wellness. Ask your health care provider about: The right schedule for you to have regular tests and exams. Things you can do on your own to prevent diseases and keep yourself healthy. What should I know about diet, weight, and exercise? Eat a healthy diet  Eat a diet that includes plenty of vegetables, fruits, low-fat dairy products, and lean protein. Do not eat a lot of foods that are high in solid fats, added sugars, or sodium. Maintain a healthy weight Body mass index (BMI) is a measurement that can be used to identify possible weight problems. It estimates body fat based on height and weight. Your health care provider can help determine your BMI and help you achieve or maintain a healthy weight. Get regular exercise Get regular exercise. This is one of the most important things you can do for your health. Most adults should: Exercise for at least 150 minutes each week. The exercise should increase your heart rate and make you sweat (moderate-intensity exercise). Do strengthening exercises at least twice a week. This is in addition to the moderate-intensity exercise. Spend less time sitting. Even light physical activity can be beneficial. Watch cholesterol and blood lipids Have your blood tested for lipids and cholesterol at 51 years of age, then have this test every 5 years. You may need to have your cholesterol levels checked more often if: Your lipid or cholesterol levels are high. You are older than 51 years of age. You are at high risk for heart disease. What should I know about cancer screening? Many types of cancers can be detected early and may often be prevented. Depending on your health history and family history, you may need to have cancer screening at various ages. This may include screening for: Colorectal cancer. Prostate cancer. Skin cancer. Lung  cancer. What should I know about heart disease, diabetes, and high blood pressure? Blood pressure and heart disease High blood pressure causes heart disease and increases the risk of stroke. This is more likely to develop in people who have high blood pressure readings or are overweight. Talk with your health care provider about your target blood pressure readings. Have your blood pressure checked: Every 3-5 years if you are 18-39 years of age. Every year if you are 40 years old or older. If you are between the ages of 65 and 75 and are a current or former smoker, ask your health care provider if you should have a one-time screening for abdominal aortic aneurysm (AAA). Diabetes Have regular diabetes screenings. This checks your fasting blood sugar level. Have the screening done: Once every three years after age 45 if you are at a normal weight and have a low risk for diabetes. More often and at a younger age if you are overweight or have a high risk for diabetes. What should I know about preventing infection? Hepatitis B If you have a higher risk for hepatitis B, you should be screened for this virus. Talk with your health care provider to find out if you are at risk for hepatitis B infection. Hepatitis C Blood testing is recommended for: Everyone born from 1945 through 1965. Anyone with known risk factors for hepatitis C. Sexually transmitted infections (STIs) You should be screened each year for STIs, including gonorrhea and chlamydia, if: You are sexually active and are younger than 51 years of age. You are older than 51 years of age and your   health care provider tells you that you are at risk for this type of infection. Your sexual activity has changed since you were last screened, and you are at increased risk for chlamydia or gonorrhea. Ask your health care provider if you are at risk. Ask your health care provider about whether you are at high risk for HIV. Your health care provider  may recommend a prescription medicine to help prevent HIV infection. If you choose to take medicine to prevent HIV, you should first get tested for HIV. You should then be tested every 3 months for as long as you are taking the medicine. Follow these instructions at home: Alcohol use Do not drink alcohol if your health care provider tells you not to drink. If you drink alcohol: Limit how much you have to 0-2 drinks a day. Know how much alcohol is in your drink. In the [place] one drink equals one 12 oz bottle of beer (355 mL), one 5 oz glass of wine (148 mL), or one 1 oz glass of hard liquor (44 mL). Lifestyle Do not use any products that contain nicotine or tobacco. These products include cigarettes, chewing tobacco, and vaping devices, such as e-cigarettes. If you need help quitting, ask your health care provider. Do not use street drugs. Do not share needles. Ask your health care provider for help if you need support or information about quitting drugs. General instructions Schedule regular health, dental, and eye exams. Stay current with your vaccines. Tell your health care provider if: You often feel depressed. You have ever been abused or do not feel safe at home. Summary Adopting a healthy lifestyle and getting preventive care are important in promoting health and wellness. Follow your health care provider's instructions about healthy diet, exercising, and getting tested or screened for diseases. Follow your health care provider's instructions on monitoring your cholesterol and blood pressure. This information is not intended to replace advice given to you by your health care provider. Make sure you discuss any questions you have with your health care provider. Document Revised: 09/12/2020 Document Reviewed: 09/12/2020 Elsevier Patient Education  2023 Elsevier Inc.  

## 2022-07-12 NOTE — Patient Instructions (Addendum)
[name], it was a pleasure seeing you today! You endorsed feeling well today. Below are some of the things we talked about this visit. We look forward to seeing you in the follow up appointment!  Today we discussed: Please get the lab work done.  We will some of your previous medications. Please pick them up today.  I have referred you to cardiology and podiatry.  I have ordered the following labs today:  Lab Orders         BMP8+Anion Gap         Lipid Profile         Hepatitis C antibody, reflex       Referrals ordered today:   Referral Orders         Ambulatory referral to Cardiology         AMB referral to CHF clinic         Ambulatory referral to Podiatry       I have ordered the following medication/changed the following medications:   Stop the following medications: Medications Discontinued During This Encounter  Medication Reason   ENTRESTO 49-51 MG Reorder   empagliflozin (JARDIANCE) 25 MG TABS tablet Reorder   amLODipine (NORVASC) 5 MG tablet Reorder   rosuvastatin (CRESTOR) 40 MG tablet Reorder   carvedilol (COREG) 25 MG tablet Reorder   insulin glargine (LANTUS) 100 UNIT/ML Solostar Pen Reorder   Insulin Pen Needle 32G X 4 MM MISC Reorder     Start the following medications: Meds ordered this encounter  Medications   amLODipine (NORVASC) 5 MG tablet    Sig: Take 1 tablet (5 mg total) by mouth daily.    Dispense:  30 tablet    Refill:  3   carvedilol (COREG) 25 MG tablet    Sig: Take 1 tablet (25 mg total) by mouth 2 (two) times daily.    Dispense:  60 tablet    Refill:  3   empagliflozin (JARDIANCE) 25 MG TABS tablet    Sig: Take 1 tablet (25 mg total) by mouth daily.    Dispense:  30 tablet    Refill:  0   ENTRESTO 49-51 MG    Sig: Take 1 tablet by mouth 2 (two) times daily.    Dispense:  60 tablet    Refill:  2   insulin glargine (LANTUS) 100 UNIT/ML Solostar Pen    Sig: Inject 20 Units into the skin daily.    Dispense:  6 mL    Refill:   1    ok to change to 28m per MD maf   Insulin Pen Needle 32G X 4 MM MISC    Sig: use with Lantus    Dispense:  100 each    Refill:  0   rosuvastatin (CRESTOR) 40 MG tablet    Sig: Take 1 tablet (40 mg total) by mouth daily.    Dispense:  30 tablet    Refill:  3   Semaglutide, 1 MG/DOSE, (OZEMPIC, 1 MG/DOSE,) 4 MG/3ML SOPN    Sig: Inject 0.25 mg into the skin once a week.    Dispense:  3 mL    Refill:  0     Follow-up: 1 month follow up  Please make sure to arrive 15 minutes prior to your next appointment. If you arrive late, you may be asked to reschedule.   We look forward to seeing you next time. Please call our clinic at [phone] you have any questions or concerns. The best  time to call is Monday-Friday from 9am-4pm, but there is someone available 24/7. If after hours or the weekend, call the main hospital number and ask for the Internal Medicine Resident On-Call. If you need medication refills, please notify your pharmacy one week in advance and they will send [place] a request.  Thank you for letting us take part in your care. Wishing you the best!  Thank you, [name], MD

## 2022-07-12 NOTE — Progress Notes (Signed)
CC: Establishment of Care  HPI:  [name] is a 52 y.o. male with a past medical history of HTN, HLD, DMII c.b neuropathy, hx of stroke, anemia, and  and presenting to the clinic today for establishment of care. Please see problem based assessment and plan for additional details. Pt was hospitalized from 02/07-02/09 for left foot swelling and pain and found to have cellulitis.   Past Medical History:  Diagnosis Date   CHF (congestive heart failure) (HCC)    Diabetes mellitus    Hypertension      Current Outpatient Medications (Endocrine & Metabolic):    Semaglutide, 1 MG/DOSE, (OZEMPIC, 1 MG/DOSE,) 4 MG/3ML SOPN, Inject 0.25 mg into the skin once a week.   empagliflozin (JARDIANCE) 25 MG TABS tablet, Take 1 tablet (25 mg total) by mouth daily.   insulin glargine (LANTUS) 100 UNIT/ML Solostar Pen, Inject 20 Units into the skin daily.  Current Outpatient Medications (Cardiovascular):    amLODipine (NORVASC) 5 MG tablet, Take 1 tablet (5 mg total) by mouth daily.   carvedilol (COREG) 25 MG tablet, Take 1 tablet (25 mg total) by mouth 2 (two) times daily.   ENTRESTO 49-51 MG, Take 1 tablet by mouth 2 (two) times daily.   rosuvastatin (CRESTOR) 40 MG tablet, Take 1 tablet (40 mg total) by mouth daily.   Current Outpatient Medications (Analgesics):    aspirin EC 81 MG EC tablet, Take 1 tablet (81 mg total) by mouth daily. Swallow whole.   Current Outpatient Medications (Other):    Blood Glucose Monitoring Suppl (FREESTYLE LITE) w/Device KIT, Use as directed   Continuous Blood Gluc Sensor (FREESTYLE LIBRE 3 SENSOR) MISC, 1 Device by Does not apply route every 14 (fourteen) days. Place 1 sensor on the skin every 14 days. Use to check glucose continuously   glucose blood (FREESTYLE LITE) test strip, Monitor blood sugar at home   Insulin Pen Needle 32G X 4 MM MISC, Use with Basaglar.   Lancets (FREESTYLE) lancets, Use a directed  Taking only Amlodipine and Coreg.   Allergies:  Metformin severe diarrhea.   Family Hx:  Maternal Grandparent: T2DM, Stroke Mother: ruptured brain aneurysm, heavy tobacco use.   Social History: Lives by himself in [place]. 2 kids one boy (67) and one girl (79). Never smoker. Never drank alcohol. Can do all ADLs and iADLs.   Review of Systems: ROS negative except for what is noted on the assessment and plan.  Vitals:   07/12/22 1403 07/12/22 1418  BP: (!) 197/87 (!) 189/91  Pulse: 88 81  Temp: 97.7 F (36.5 C)   TempSrc: Oral   SpO2: 100%   Weight: 285 lb 11.2 oz (129.6 kg)   Height: 6\' 2"  (1.88 m)    Physical Exam: General: Well appearing, NAD HENT: normocephalic, atraumatic EYES: conjunctiva non-erythematous, no scleral icterus CV: regular rate, normal rhythm, no murmurs, rubs, gallops. Pulmonary: normal work of breathing on RA, lungs clear to auscultation, no rales, wheezes, rhonchi Abdominal: non-distended, soft, non-tender to palpation, normal BS Skin: Warm and dry, no rashes or lesions Neurological: MS: awake, alert and oriented x3, normal speech and fund of knowledge, significantly decreased sensation in bilateral feet. Motor: moves all extremities antigravity Psych: normal affect    Assessment & Plan:   Preventive measure Referral to colonoscopy placed.  Hep C testing performed.  Pt has eye appointment scheduled.   Diabetic ulcer of left foot associated with type 2 diabetes mellitus ([place]) Pt hospitalized for cellulitis for the foot and discharged with  doxy 100 mg BID for 10 days. It has resolved.   Type II diabetes mellitus with neurological manifestations ([place]) Pt with uncontrolled DMII. He is non-adherent to his regimen. His regimen is Lantus 20 units, Jardiance 25 mg qd. He is regularly taking his insulin but not his jardiance. A1c 10.9. Will refill his Jardiance 25 mg qd and start him on ozempic 0.25 mg qd. Will up titrate at follow up and increase lantus based on CGM.   Embolic stroke (HCC) Pt with  hx of embolic stroke. Part of the work up was a ambulatory heart monitor. Will refer him to cardiology for this. Referral placed.   Chronic systolic CHF (congestive heart failure) (HCC) Non-adherent to regimen due to insurance. Pt is euvolemic on exam. Has not taken his entresto for 3 months. Will restart his regimen of Entresto 49-51 mg BID, Jardiance 25 mg qd, and Coreg 25 mg BID. No benefit shown for spirinolactone at EF >35 %. At follow up we can titrate his Entresto to higher dose and repeat lab work. Pt is not established with HF team so will place a referral.   HTN (hypertension) BP elevated but pt non-adherent to his regimen. Pt is asymptomatic. Will refill his regimen including coreg, amlodipine, entresto, and jardiance. At follow up if pt is uncontrolled, can uptitrate his regimen. Discussed at length the importance of adequate control of his DMII and HTN for long term health.   Hyperlipidemia associated with type 2 diabetes mellitus ([place]) Pt has HLD that is uncontrolled. His LDL goal is 70 given his stroke but his LDL is at 123. Will start him on Crestor 40 mg. Repeat lipid panel at 6-8 weeks.   Microcytic anemia Pt with long standing hx of microcytic anemia since 2015. Will obtain iron studies with next blood work. Given his age pt does need a colonoscopy. Will place a referral to colonoscopy for screening.    See Encounters Tab for problem based charting.  Patient discussed with Dr. Edrick Kins, MD Tillie Rung. [facility] Internal Medicine Residency, PGY-2

## 2022-07-12 NOTE — Progress Notes (Signed)
Subjective:   Steven Cochran is a 52 y.o. male who presents for an Initial Medicare Annual Wellness Visit. I connected with  Beverely Low on 07/12/22 by a  IN PERSON [place]      Patient Location: Other:  [place]   Provider Location: Office/Clinic  I discussed the limitations of evaluation and management by telemedicine. The patient expressed understanding and agreed to proceed.   Review of Systems    DEFERRED  TO PCP  Cardiac Risk Factors include: advanced age (>7mn, >>69women);diabetes mellitus;hypertension;male gender     Objective:    Today's Vitals   07/12/22 1426 07/12/22 1427  Pulse: 88   Temp: 97.7 F (36.5 C)   TempSrc: Oral   Weight: 285 lb 11.2 oz (129.6 kg)   Height: '6\' 2"'$  (1.88 m)   PainSc:  0-No pain   Body mass index is 36.68 kg/m.     07/12/2022    2:16 PM 06/13/2022    9:27 PM 09/02/2021   12:49 AM 08/30/2021    1:03 PM 12/23/2020    3:48 AM 03/31/2020    1:38 PM 10/06/2019   10:17 PM  Advanced Directives  Does Patient Have a Medical Advance Directive? No No No No No No No  Would patient like information on creating a medical advance directive? No - Patient declined No - Patient declined No - Patient declined No - Patient declined  No - Patient declined No - Patient declined    Current Medications (verified) Outpatient Encounter Medications as of 07/12/2022  Medication Sig   amLODipine (NORVASC) 5 MG tablet Take 1 tablet (5 mg total) by mouth daily.   aspirin EC 81 MG EC tablet Take 1 tablet (81 mg total) by mouth daily. Swallow whole.   Blood Glucose Monitoring Suppl (FREESTYLE LITE) w/Device KIT Use as directed   empagliflozin (JARDIANCE) 25 MG TABS tablet Take 1 tablet (25 mg total) by mouth daily.   ENTRESTO 49-51 MG Take 1 tablet by mouth 2 (two) times daily.   glucose blood (FREESTYLE LITE) test strip Monitor blood sugar at home   insulin glargine (LANTUS) 100 UNIT/ML Solostar Pen Inject 20 Units into the skin daily.   Insulin Pen Needle  32G X 4 MM MISC use with Lantus   Lancets (FREESTYLE) lancets Use a directed   rosuvastatin (CRESTOR) 40 MG tablet Take 1 tablet (40 mg total) by mouth daily.   carvedilol (COREG) 25 MG tablet Take 1 tablet (25 mg total) by mouth 2 (two) times daily.   No facility-administered encounter medications on file as of 07/12/2022.    Allergies (verified) Metformin and related   History: Past Medical History:  Diagnosis Date   CH[place]estive heart failure) [place]    Diabetes mellitus    Hypertension    History reviewed. No pertinent surgical history. History reviewed. No pertinent family history. Social History   Socioeconomic History   Marital status: Married    Spouse name: Not on file   Number of children: Not on file   Years of education: Not on file   Highest education level: Not on file  Occupational History   Not on file  Tobacco Use   Smoking status: Never   Smokeless tobacco: Never  Vaping Use   Vaping Use: Never used  Substance and Sexual Activity   Alcohol use: No   Drug use: No   Sexual activity: Not Currently    Partners: Female    Birth control/protection: None  Other Topics Concern  Not on file  Social History Narrative   Not on file   Social Determinants of Health   Financial Resource Strain: Low Risk  (07/12/2022)   Overall Financial Resource Strain (CARDIA)    Difficulty of Paying Living Expenses: Not hard at all  Food Insecurity: No Food Insecurity (07/12/2022)   Hunger Vital Sign    Worried About Running Out of Food in the Last Year: Never true    Ran Out of Food in the Last Year: Never true  Transportation Needs: Unmet Transportation Needs (07/12/2022)   PRAPARE - Hydrologist (Medical): Yes    Lack of Transportation (Non-Medical): Yes  Physical Activity: Sufficiently Active (07/12/2022)   Exercise Vital Sign    Days of Exercise per Week: 5 days    Minutes of Exercise per Session: 40 min  Stress: No Stress Concern Present  (07/12/2022)   [place] of Stress : Not at all  Social Connections: Socially Isolated (07/12/2022)   Social Connection and Isolation Panel [NHANES]    Frequency of Communication with Friends and Family: More than three times a week    Frequency of Social Gatherings with Friends and Family: Never    Attends Religious Services: Never    Marine scientist or Organizations: No    Attends Music therapist: Never    Marital Status: Divorced    Tobacco Counseling Counseling given: Not Answered   Clinical Intake:  Pre-visit preparation completed: Yes  Pain : No/denies pain Pain Score: 0-No pain     BMI - recorded: 36 Nutritional Status: BMI > 30  Obese Nutritional Risks: None Diabetes: Yes Did pt. bring in CBG monitor from home?: No  How often do you need to have someone help you when you read instructions, pamphlets, or other written materials from your doctor or pharmacy?: 1 - Never What is the last grade level you completed in school?: 12 GRADE  Diabetic?YES   Interpreter Needed?: No  Information entered by :: [facility]   Activities of Daily Living    07/12/2022    2:29 PM 07/12/2022    2:14 PM  In your present state of health, do you have any difficulty performing the following activities:  Hearing? 0 0  Vision? 1 1  Difficulty concentrating or making decisions? 1 1  Walking or climbing stairs? 0 0  Dressing or bathing? 0 0  Doing errands, shopping? 0 0  Preparing Food and eating ? N   Using the Toilet? N   In the past six months, have you accidently leaked urine? N   Do you have problems with loss of bowel control? N   Managing your Medications? Y   Managing your Finances? N   Housekeeping or managing your Housekeeping? N     Patient Care Team: [name], MD as PCP - General (Internal Medicine) Sueanne Margarita, MD as PCP - Cardiology (Cardiology)  Indicate  any recent Medical Services you may have received from other than [facility] providers in the past year (date may be approximate).     Assessment:   This is a routine wellness examination for [employer].  Hearing/Vision screen No results found.  Dietary issues and exercise activities discussed: Current Exercise Habits: The patient has a physically strenuous job, but has no regular exercise apart from work.   Goals Addressed   None   Depression Screen    07/12/2022  2:29 PM 07/12/2022    2:11 PM 10/22/2014    4:14 PM  PHQ 2/9 Scores  PHQ - 2 Score 1 0 0    Fall Risk    07/12/2022    2:29 PM 07/12/2022    2:13 PM  Fall Risk   Falls in the past year? 0 0  Number falls in past yr: 0 0  Injury with Fall? 0 0  Risk for fall due to : No Fall Risks No Fall Risks  Follow up Education provided;Falls evaluation completed Education provided;Falls evaluation completed    FALL RISK PREVENTION PERTAINING TO THE HOME:  Any stairs in or around the home? Yes  If so, are there any without handrails? No  Home free of loose throw rugs in walkways, pet beds, electrical cords, etc? Yes  Adequate lighting in your home to reduce risk of falls? Yes   ASSISTIVE DEVICES UTILIZED TO PREVENT FALLS:  Life alert? No  Use of a cane, walker or w/c? No  Grab bars in the bathroom? No  Shower chair or bench in shower? No  Elevated toilet seat or a handicapped toilet? No   TIMED UP AND GO:  Was the test performed? No .  Length of time to ambulate 10 feet: n/a sec.     Cognitive Function:        07/12/2022    2:29 PM  6CIT Screen  What Year? 0 points  What month? 0 points  What time? 0 points  Count back from 20 0 points  Months in reverse 0 points  Repeat phrase 0 points  Total Score 0 points    Immunizations Immunization History  Administered Date(s) Administered   Influenza,inj,Quad PF,6+ Mos 06/15/2022   Pneumococcal Polysaccharide-23 10/02/2019   Tdap 10/16/2018    TDAP status: Up to  date  Flu Vaccine status: Up to date    Covid-19 vaccine status: Completed vaccines  Qualifies for Shingles Vaccine? No   Zostavax completed No   Shingrix Completed?: No.    Education has been provided regarding the importance of this vaccine. Patient has been advised to call insurance company to determine out of pocket expense if they have not yet received this vaccine. Advised may also receive vaccine at local pharmacy or Health Dept. Verbalized acceptance and understanding.  Screening Tests Health Maintenance  Topic Date Due   COVID-19 Vaccine (1) Never done   FOOT EXAM  Never done   OPHTHALMOLOGY EXAM  Never done   Diabetic kidney evaluation - Urine ACR  Never done   Hepatitis C Screening  Never done   COLONOSCOPY (Pts 45-20yr Insurance coverage will need to be confirmed)  Never done   Zoster Vaccines- Shingrix (1 of 2) Never done   HEMOGLOBIN A1C  12/13/2022   Diabetic kidney evaluation - eGFR measurement  06/16/2023   Medicare Annual Wellness (AWV)  07/12/2023   DTaP/Tdap/Td (2 - Td or Tdap) 10/15/2028   INFLUENZA VACCINE  Completed   HIV Screening  Completed   HPV VACCINES  Aged Out    Health Maintenance  Health Maintenance Due  Topic Date Due   COVID-19 Vaccine (1) Never done   FOOT EXAM  Never done   OPHTHALMOLOGY EXAM  Never done   Diabetic kidney evaluation - Urine ACR  Never done   Hepatitis C Screening  Never done   COLONOSCOPY (Pts 45-459yrInsurance coverage will need to be confirmed)  Never done   Zoster Vaccines- Shingrix (1 of 2) Never done  Lung Cancer Screening: (Low Dose CT Chest recommended if Age 75-80 years, 30 pack-year currently smoking OR have quit w/in 15years.) does not qualify.   Lung Cancer Screening Referral: DEFERRED TO PCP   Additional Screening:  Hepatitis C Screening: does qualify; Completed DEFERRED TO PCP   Vision Screening: Recommended annual ophthalmology exams for early detection of glaucoma and other disorders of the  eye. Is the patient up to date with their annual eye exam?  Yes  Who is the provider or what is the name of the office in which the patient attends annual eye exams? [facility]  If pt is not established with a provider, would they like to be referred to a provider to establish care? No .   Dental Screening: Recommended annual dental exams for proper oral hygiene  Community Resource Referral / Chronic Care Management: CRR required this visit?  No   CCM required this visit?  No      Plan:     I have personally reviewed and noted the following in the patient's chart:   Medical and social history Use of alcohol, tobacco or illicit drugs  Current medications and supplements including opioid prescriptions. Patient is not currently taking opioid prescriptions. Functional ability and status Nutritional status Physical activity Advanced directives List of other physicians Hospitalizations, surgeries, and ER visits in previous 12 months Vitals Screenings to include cognitive, depression, and falls Referrals and appointments  In addition, I have reviewed and discussed with patient certain preventive protocols, quality metrics, and best practice recommendations. A written personalized care plan for preventive services as well as general preventive health recommendations were provided to patient.     Judyann Munson, CMA   07/12/2022   Nurse Notes: IN PERSON [facility]   Mr. Soine , Thank you for taking time to come for your Medicare Wellness Visit. I appreciate your ongoing commitment to your health goals. Please review the following plan we discussed and let me know if I can assist you in the future.   These are the goals we discussed:  Goals   None     This is a list of the screening recommended for you and due dates:  Health Maintenance  Topic Date Due   COVID-19 Vaccine (1) Never done   Complete foot exam   Never done   Eye exam for diabetics  Never done   Yearly kidney  health urinalysis for diabetes  Never done   Hepatitis C Screening: USPSTF Recommendation to screen - Ages 8-79 yo.  Never done   Colon Cancer Screening  Never done   Zoster (Shingles) Vaccine (1 of 2) Never done   Hemoglobin A1C  12/13/2022   Yearly kidney function blood test for diabetes  06/16/2023   Medicare Annual Wellness Visit  07/12/2023   DTaP/Tdap/Td vaccine (2 - Td or Tdap) 10/15/2028   Flu Shot  Completed   HIV Screening  Completed   HPV Vaccine  Aged Out

## 2022-07-13 ENCOUNTER — Telehealth: Payer: Self-pay | Admitting: Internal Medicine

## 2022-07-13 ENCOUNTER — Other Ambulatory Visit (HOSPITAL_COMMUNITY): Payer: Self-pay

## 2022-07-13 ENCOUNTER — Ambulatory Visit: Payer: Medicare Other | Admitting: Family

## 2022-07-13 LAB — BMP8+ANION GAP
Anion Gap: 11 mmol/L (ref 10.0–18.0)
BUN/Creatinine Ratio: 21 — ABNORMAL HIGH (ref 9–20)
BUN: 27 mg/dL — ABNORMAL HIGH (ref 6–24)
CO2: 22 mmol/L (ref 20–29)
Calcium: 9.2 mg/dL (ref 8.7–10.2)
Chloride: 102 mmol/L (ref 96–106)
Creatinine, Ser: 1.26 mg/dL (ref 0.76–1.27)
Glucose: 195 mg/dL — ABNORMAL HIGH (ref 70–99)
Potassium: 4.2 mmol/L (ref 3.5–5.2)
Sodium: 135 mmol/L (ref 134–144)
eGFR: 69 mL/min/{1.73_m2} (ref >59)

## 2022-07-13 LAB — LIPID PANEL
Chol/HDL Ratio: 5.3 ratio — ABNORMAL HIGH (ref 0.0–5.0)
Cholesterol, Total: 200 mg/dL — ABNORMAL HIGH (ref 100–199)
HDL: 38 mg/dL — ABNORMAL LOW (ref >39)
LDL Chol Calc (NIH): 123 mg/dL — ABNORMAL HIGH (ref 0–99)
Triglycerides: 219 mg/dL — ABNORMAL HIGH (ref 0–149)
VLDL Cholesterol Cal: 39 mg/dL (ref 5–40)

## 2022-07-13 LAB — HCV AB W REFLEX TO QUANT PCR: HCV Ab: NONREACTIVE

## 2022-07-13 LAB — HCV INTERPRETATION

## 2022-07-13 MED ORDER — FREESTYLE LIBRE 3 SENSOR MISC: 1.0000 | 2 refills | Status: DC

## 2022-07-13 NOTE — Telephone Encounter (Signed)
Patient called after hours pager. States he was recently seen in [facility] clinic. Was supposed to have his Freestyle [place] 3 sensors sent to pharmacy for refill, but Rx not in. Sent in sensors for patient's glucose monitoring.

## 2022-07-15 DIAGNOSIS — Z299 Encounter for prophylactic measures, unspecified: Secondary | ICD-10-CM | POA: Insufficient documentation

## 2022-07-15 DIAGNOSIS — E1169 Type 2 diabetes mellitus with other specified complication: Secondary | ICD-10-CM | POA: Insufficient documentation

## 2022-07-15 DIAGNOSIS — D509 Iron deficiency anemia, unspecified: Secondary | ICD-10-CM | POA: Insufficient documentation

## 2022-07-15 DIAGNOSIS — D638 Anemia in other chronic diseases classified elsewhere: Secondary | ICD-10-CM | POA: Insufficient documentation

## 2022-07-15 NOTE — Assessment & Plan Note (Signed)
Pt has HLD that is uncontrolled. His LDL goal is 70 given his stroke but his LDL is at 123. Will start him on Crestor 40 mg. Repeat lipid panel at 6-8 weeks.

## 2022-07-15 NOTE — Assessment & Plan Note (Signed)
BP elevated but pt non-adherent to his regimen. Pt is asymptomatic. Will refill his regimen including coreg, amlodipine, entresto, and jardiance. At follow up if pt is uncontrolled, can uptitrate his regimen. Discussed at length the importance of adequate control of his DMII and HTN for long term health.

## 2022-07-15 NOTE — Assessment & Plan Note (Addendum)
Referral to colonoscopy placed.  Hep C testing performed.  Pt has eye appointment scheduled.

## 2022-07-15 NOTE — Assessment & Plan Note (Signed)
Pt with long standing hx of microcytic anemia since 2015. Will obtain iron studies with next blood work. Given his age pt does need a colonoscopy. Will place a referral to colonoscopy for screening.

## 2022-07-15 NOTE — Assessment & Plan Note (Signed)
Pt hospitalized for cellulitis for the foot and discharged with doxy 100 mg BID for 10 days. It has resolved.

## 2022-07-15 NOTE — Assessment & Plan Note (Signed)
Pt with uncontrolled DMII. He is non-adherent to his regimen. His regimen is Lantus 20 units, Jardiance 25 mg qd. He is regularly taking his insulin but not his jardiance. A1c 10.9. Will refill his Jardiance 25 mg qd and start him on ozempic 0.25 mg qd. Will up titrate at follow up and increase lantus based on CGM.

## 2022-07-15 NOTE — Assessment & Plan Note (Signed)
Non-adherent to regimen due to insurance. Pt is euvolemic on exam. Has not taken his entresto for 3 months. Will restart his regimen of Entresto 49-51 mg BID, Jardiance 25 mg qd, and Coreg 25 mg BID. No benefit shown for spirinolactone at EF >35 %. At follow up we can titrate his Entresto to higher dose and repeat lab work. Pt is not established with HF team so will place a referral.

## 2022-07-15 NOTE — Assessment & Plan Note (Signed)
Pt with hx of embolic stroke. Part of the work up was a ambulatory heart monitor. Will refer him to cardiology for this. Referral placed.

## 2022-07-16 ENCOUNTER — Other Ambulatory Visit: Payer: Self-pay | Admitting: Internal Medicine

## 2022-07-16 ENCOUNTER — Telehealth: Payer: Self-pay

## 2022-07-16 DIAGNOSIS — Z794 Long term (current) use of insulin: Secondary | ICD-10-CM

## 2022-07-16 MED ORDER — SEMAGLUTIDE(0.25 OR 0.5MG/DOS) 2 MG/3ML ~~LOC~~ SOPN: 0.2500 mg | PEN_INJECTOR | SUBCUTANEOUS | 0 refills | Status: AC

## 2022-07-16 MED ORDER — FREESTYLE LIBRE 3 SENSOR MISC: 1.0000 | 2 refills | Status: DC

## 2022-07-16 NOTE — Telephone Encounter (Signed)
Prior Authorization for patient Steven Cochran Libre 3 sensor) came through via fax from pharmacy has been faxed to [place] will send approval or denial to patients home address.

## 2022-07-17 ENCOUNTER — Other Ambulatory Visit (HOSPITAL_COMMUNITY): Payer: Self-pay

## 2022-07-17 NOTE — Progress Notes (Signed)
Internal Medicine Clinic Attending  Case discussed with Dr. Khan  At the time of the visit.  We reviewed the resident's history and exam and pertinent patient test results.  I agree with the assessment, diagnosis, and plan of care documented in the resident's note.  

## 2022-07-19 ENCOUNTER — Other Ambulatory Visit (HOSPITAL_COMMUNITY): Payer: Self-pay

## 2022-07-20 ENCOUNTER — Ambulatory Visit: Payer: Medicare Other | Admitting: Family

## 2022-07-23 NOTE — Progress Notes (Signed)
Internal Medicine Clinic Attending  Case and documentation of Dr. Khan  reviewed.  I reviewed the AWV findings.  I agree with the assessment, diagnosis, and plan of care documented in the AWV note.     

## 2022-07-25 LAB — HM DIABETES EYE EXAM

## 2022-08-01 ENCOUNTER — Ambulatory Visit: Payer: Medicare Other | Admitting: Family

## 2022-08-07 ENCOUNTER — Other Ambulatory Visit: Payer: Self-pay

## 2022-08-07 ENCOUNTER — Other Ambulatory Visit (HOSPITAL_COMMUNITY): Payer: Self-pay

## 2022-08-13 ENCOUNTER — Other Ambulatory Visit (HOSPITAL_COMMUNITY): Payer: Self-pay

## 2022-08-27 ENCOUNTER — Encounter: Payer: Medicare Other | Admitting: Internal Medicine

## 2022-08-27 ENCOUNTER — Ambulatory Visit: Payer: Medicare Other | Admitting: Podiatry

## 2022-08-27 NOTE — Progress Notes (Deleted)
   CC: HTN, DMII follow up  HPI:  [name] is a 52 y.o. with medical history of HTN, DMII, HLD, Combined HF (EF 45, G1DD), embolic stroke presenting to [facility] for follow up on HTN and DMII. Established care on 07/2022.  Please see problem-based list for further details, assessments, and plans.  Past Medical History:  Diagnosis Date   CHF (congestive heart failure) (HCC)    Diabetes mellitus    Hypertension     Current Outpatient Medications (Endocrine & Metabolic):    empagliflozin (JARDIANCE) 25 MG TABS tablet, Take 1 tablet (25 mg total) by mouth daily.   insulin glargine (LANTUS) 100 UNIT/ML Solostar Pen, Inject 20 Units into the skin daily.  Current Outpatient Medications (Cardiovascular):    amLODipine (NORVASC) 5 MG tablet, Take 1 tablet (5 mg total) by mouth daily.   carvedilol (COREG) 25 MG tablet, Take 1 tablet (25 mg total) by mouth 2 (two) times daily.   ENTRESTO 49-51 MG, Take 1 tablet by mouth 2 (two) times daily.   rosuvastatin (CRESTOR) 40 MG tablet, Take 1 tablet (40 mg total) by mouth daily.   Current Outpatient Medications (Analgesics):    aspirin EC 81 MG EC tablet, Take 1 tablet (81 mg total) by mouth daily. Swallow whole.   Current Outpatient Medications (Other):    Blood Glucose Monitoring Suppl (FREESTYLE LITE) w/Device KIT, Use as directed   Continuous Blood Gluc Sensor (FREESTYLE LIBRE 3 SENSOR) MISC, 1 Device by Does not apply route every 14 (fourteen) days. Place 1 sensor on the skin every 14 days. Use to check glucose continuously   glucose blood (FREESTYLE LITE) test strip, Monitor blood sugar at home   Insulin Pen Needle 32G X 4 MM MISC, Use with Basaglar.   Lancets (FREESTYLE) lancets, Use a directed  Review of Systems:  Review of system negative unless stated in the problem list or HPI.    Physical Exam:  There were no vitals filed for this visit. Physical Exam General: NAD HENT: NCAT Lungs: CTAB, no wheeze, rhonchi or rales.   Cardiovascular: Normal heart sounds, no r/m/g, 2+ pulses in all extremities. No LE edema Abdomen: No TTP, normal bowel sounds MSK: No asymmetry or muscle atrophy.  Skin: no lesions noted on exposed skin Neuro: Alert and oriented x4. CN grossly intact Psych: Normal mood and normal affect   Assessment & Plan:   No problem-specific Assessment & Plan notes found for this encounter.   See Encounters Tab for problem based charting.  Patient Discussed with Dr. {NAMES:[id number]::"Guilloud","Hoffman","Mullen","Narendra","Vincent","Machen","Lau","Hatcher","Williams"} Steven Abbot, MD Eligha Bridegroom. [facility] Internal Medicine Residency, PGY-2

## 2022-08-29 ENCOUNTER — Ambulatory Visit (INDEPENDENT_AMBULATORY_CARE_PROVIDER_SITE_OTHER): Payer: Medicare HMO | Admitting: Internal Medicine

## 2022-08-29 ENCOUNTER — Encounter: Payer: Self-pay | Admitting: Internal Medicine

## 2022-08-29 VITALS — BP 148/87 | HR 75 | Ht 74.5 in | Wt 272.6 lb

## 2022-08-29 DIAGNOSIS — D509 Iron deficiency anemia, unspecified: Secondary | ICD-10-CM | POA: Diagnosis not present

## 2022-08-29 DIAGNOSIS — E1149 Type 2 diabetes mellitus with other diabetic neurological complication: Secondary | ICD-10-CM | POA: Diagnosis not present

## 2022-08-29 DIAGNOSIS — Z299 Encounter for prophylactic measures, unspecified: Secondary | ICD-10-CM

## 2022-08-29 DIAGNOSIS — I10 Essential (primary) hypertension: Secondary | ICD-10-CM | POA: Diagnosis not present

## 2022-08-29 DIAGNOSIS — I5022 Chronic systolic (congestive) heart failure: Secondary | ICD-10-CM

## 2022-08-29 DIAGNOSIS — E11621 Type 2 diabetes mellitus with foot ulcer: Secondary | ICD-10-CM | POA: Diagnosis not present

## 2022-08-29 DIAGNOSIS — L97529 Non-pressure chronic ulcer of other part of left foot with unspecified severity: Secondary | ICD-10-CM | POA: Diagnosis not present

## 2022-08-29 DIAGNOSIS — H401110 Primary open-angle glaucoma, right eye, stage unspecified: Secondary | ICD-10-CM | POA: Diagnosis not present

## 2022-08-29 DIAGNOSIS — Z7984 Long term (current) use of oral hypoglycemic drugs: Secondary | ICD-10-CM

## 2022-08-29 DIAGNOSIS — I110 Hypertensive heart disease with heart failure: Secondary | ICD-10-CM

## 2022-08-29 DIAGNOSIS — H401190 Primary open-angle glaucoma, unspecified eye, stage unspecified: Secondary | ICD-10-CM | POA: Insufficient documentation

## 2022-08-29 DIAGNOSIS — Z794 Long term (current) use of insulin: Secondary | ICD-10-CM

## 2022-08-29 DIAGNOSIS — I6310 Cerebral infarction due to embolism of unspecified precerebral artery: Secondary | ICD-10-CM

## 2022-08-29 DIAGNOSIS — E13319 Other specified diabetes mellitus with unspecified diabetic retinopathy without macular edema: Secondary | ICD-10-CM | POA: Insufficient documentation

## 2022-08-29 LAB — POCT GLYCOSYLATED HEMOGLOBIN (HGB A1C): Hemoglobin A1C: 8.5 % — AB (ref 4.0–5.6)

## 2022-08-29 LAB — GLUCOSE, CAPILLARY: Glucose-Capillary: 193 mg/dL — ABNORMAL HIGH (ref 70–99)

## 2022-08-29 MED ORDER — SEMAGLUTIDE(0.25 OR 0.5MG/DOS) 2 MG/3ML ~~LOC~~ SOPN: 0.2500 mg | PEN_INJECTOR | SUBCUTANEOUS | 0 refills | Status: DC

## 2022-08-29 MED ORDER — AMLODIPINE BESYLATE 5 MG PO TABS: 5.0000 mg | ORAL_TABLET | Freq: Every day | ORAL | 3 refills | Status: DC

## 2022-08-29 MED ORDER — SEMAGLUTIDE(0.25 OR 0.5MG/DOS) 2 MG/3ML ~~LOC~~ SOPN: 0.5000 mg | PEN_INJECTOR | SUBCUTANEOUS | 0 refills | Status: DC

## 2022-08-29 NOTE — Patient Instructions (Addendum)
Steven Cochran, it was a pleasure seeing you today! You endorsed feeling well today. Below are some of the things we talked about this visit. We look forward to seeing you in the follow up appointment!  Today we discussed: Continue taking your medications. We will do lab work today. I will increase your ozempic to 0.5 mg weekly.  To prevent hypoglycemic episodes in the morning, eat a small snack before sleeping as your last food intake is at 8 pm and you sleep around 11 pm or 12 am.  Follow up with podiatry.  If you have any problems with medications, let us know.   [facility] Gastroenterologist in [place], [place] Located in: Willene Hatchet [facility] [address] Address: [address] [place] [address], [place], [place] [address] Phone: [phone]  I have ordered the following labs today:  Lab Orders         BMP8+Anion Gap         Microalbumin / Creatinine Urine Ratio         POC Hbg A1C        Referrals ordered today:   Referral Orders  No referral(s) requested today     I have ordered the following medication/changed the following medications:   Stop the following medications: Medications Discontinued During This Encounter  Medication Reason   Semaglutide,0.25 or 0.5MG /DOS, 2 MG/3ML SOPN    amLODipine (NORVASC) 5 MG tablet Reorder     Start the following medications: Meds ordered this encounter  Medications   DISCONTD: Semaglutide,0.25 or 0.5MG /DOS, 2 MG/3ML SOPN    Sig: Inject 0.25 mg into the skin once a week for 28 days.    Dispense:  3 mL    Refill:  0   amLODipine (NORVASC) 5 MG tablet    Sig: Take 1 tablet (5 mg total) by mouth daily.    Dispense:  30 tablet    Refill:  3   Semaglutide,0.25 or 0.5MG /DOS, 2 MG/3ML SOPN    Sig: Inject 0.5 mg into the skin once a week.    Dispense:  3 mL    Refill:  0     Follow-up: 1 month follow up   Please make sure to arrive 15 minutes prior to your next appointment. If you  arrive late, you may be asked to reschedule.   We look forward to seeing you next time. Please call our clinic at [phone] if you have any questions or concerns. The best time to call is Monday-Friday from 9am-4pm, but there is someone available 24/7. If after hours or the weekend, call the main hospital number and ask for the Internal Medicine Resident On-Call. If you need medication refills, please notify your pharmacy one week in advance and they will send [place] a request.  Thank you for letting us take part in your care. Wishing you the best!  Thank you, Gwenevere Abbot, MD

## 2022-08-29 NOTE — Assessment & Plan Note (Signed)
Sees [facility]. Last seen on 08/03/22.

## 2022-08-29 NOTE — Progress Notes (Unsigned)
   CC: PCP follow up  HPI:  [name] is a 52 y.o. male with a past medical history of HTN, HLD, DMII c.b neuropathy, hx of stroke, anemia  presenting to [facility] for a follow up.  Please see problem-based list for further details, assessments, and plans.  Past Medical History:  Diagnosis Date   CHF (congestive heart failure)    Diabetes mellitus    Hypertension     Current Outpatient Medications (Endocrine & Metabolic):    empagliflozin (JARDIANCE) 25 MG TABS tablet, Take 1 tablet (25 mg total) by mouth daily.   insulin glargine (LANTUS) 100 UNIT/ML Solostar Pen, Inject 20 Units into the skin daily.  Current Outpatient Medications (Cardiovascular):    amLODipine (NORVASC) 5 MG tablet, Take 1 tablet (5 mg total) by mouth daily.   carvedilol (COREG) 25 MG tablet, Take 1 tablet (25 mg total) by mouth 2 (two) times daily.   ENTRESTO 49-51 MG, Take 1 tablet by mouth 2 (two) times daily.   rosuvastatin (CRESTOR) 40 MG tablet, Take 1 tablet (40 mg total) by mouth daily.   Current Outpatient Medications (Analgesics):    aspirin EC 81 MG EC tablet, Take 1 tablet (81 mg total) by mouth daily. Swallow whole.   Current Outpatient Medications (Other):    Blood Glucose Monitoring Suppl (FREESTYLE LITE) w/Device KIT, Use as directed   Continuous Blood Gluc Sensor (FREESTYLE LIBRE 3 SENSOR) MISC, 1 Device by Does not apply route every 14 (fourteen) days. Place 1 sensor on the skin every 14 days. Use to check glucose continuously   glucose blood (FREESTYLE LITE) test strip, Monitor blood sugar at home   Insulin Pen Needle 32G X 4 MM MISC, Use with Basaglar.   Lancets (FREESTYLE) lancets, Use a directed  Review of Systems:  Review of system negative unless stated in the problem list or HPI.    Physical Exam:  Vitals:   08/29/22 1027  BP: (!) 149/81  Pulse: 76  SpO2: 100%  Weight: 272 lb 9.6 oz (123.7 kg)  Height: 6' 2.5" (1.892 m)   Physical Exam General: NAD HENT: NCAT Lungs:  CTAB, no wheeze, rhonchi or rales.  Cardiovascular: Normal heart sounds, no r/m/g, 2+ pulses in all extremities. No LE edema Abdomen: No TTP, normal bowel sounds MSK: No asymmetry or muscle atrophy.  Skin: no lesions noted on exposed skin Neuro: Alert and oriented x4. CN grossly intact Psych: Normal mood and normal affect   Assessment & Plan:   No problem-specific Assessment & Plan notes found for this encounter.   See Encounters Tab for problem based charting.  Patient Discussed with Dr. {NAMES:[id number]::"Guilloud","Hoffman","Mullen","Narendra","Vincent","Machen","Lau","Hatcher","Williams"} Gwenevere Abbot, MD Eligha Bridegroom. [facility] Internal Medicine Residency, PGY-2   A1c Repeat A1c. Ozempic 0.25 mg qd, jardiance 25 mg qd, insulin 20 units daily.   HTN On amlodipine 5 mg, Coreg 25 mg BID, Entresto 49-51 mg BID, Jardiance 25 mg qd. States he does not have metformin.   Weight down 285 to 272 lbs

## 2022-08-30 LAB — BMP8+ANION GAP
Anion Gap: 13 mmol/L (ref 10.0–18.0)
BUN/Creatinine Ratio: 15 (ref 9–20)
BUN: 21 mg/dL (ref 6–24)
CO2: 24 mmol/L (ref 20–29)
Calcium: 8.8 mg/dL (ref 8.7–10.2)
Chloride: 105 mmol/L (ref 96–106)
Creatinine, Ser: 1.36 mg/dL — ABNORMAL HIGH (ref 0.76–1.27)
Glucose: 205 mg/dL — ABNORMAL HIGH (ref 70–99)
Potassium: 4.8 mmol/L (ref 3.5–5.2)
Sodium: 142 mmol/L (ref 134–144)
eGFR: 63 mL/min/{1.73_m2} (ref >59)

## 2022-08-30 LAB — MICROALBUMIN / CREATININE URINE RATIO
Creatinine, Urine: 160.6 mg/dL
Microalb/Creat Ratio: 1997 mg/g creat — ABNORMAL HIGH (ref 0–29)
Microalbumin, Urine: 3207.7 ug/mL

## 2022-08-30 MED ORDER — ENTRESTO 97-103 MG PO TABS: 1.0000 | ORAL_TABLET | Freq: Two times a day (BID) | ORAL | 11 refills | Status: DC

## 2022-08-30 NOTE — Assessment & Plan Note (Signed)
Review of records show pt had heart monitoring that did not show any arrhythmia. All ECG in the system reviewed and all without any arrhythmia.

## 2022-08-30 NOTE — Assessment & Plan Note (Signed)
Pt needs colonoscopy. Referral to GI placed again as Dr. Haywood Pao office does not have any appointments. Will repeat CBC at next visit since initial CBC was obtained during acute illness.

## 2022-08-30 NOTE — Assessment & Plan Note (Signed)
Resolved. Podiatry referral was placed last visit but pt missed appointment. Given his neuropathy, he will benefit from seeing podiatry. He states they are trying to work him in. Pt performs daily feet exam.

## 2022-08-30 NOTE — Assessment & Plan Note (Signed)
GI referral placed. ACR performed this visit.

## 2022-08-30 NOTE — Assessment & Plan Note (Signed)
Pt has combined CHF. He is adherent to his regimen. He is euvolemic today. Not currently on diuretics and no current need for diuretics. Repeat BMP shows overall stable renal function, so we can increase his Entresto to higher dose. Heart failure team referral placed last visit but pt states he has not heard from them. Will follow up on the referral.

## 2022-08-30 NOTE — Assessment & Plan Note (Signed)
Pt has DMII. Last A1c was 11.7. Pt is on Ozempic 0.25 mg qd, jardiance 25 mg qd, insulin 20 units daily. A1c today is 8.5. Pt has some hypoglycemic episodes with CBG around 70s in the early am so I advised him to eat a small snack prior to sleeping given his last oral intake is at 8pm and he sleeps around 11 pm. Pt has weight loss which can be attributed to ozempic and dietary lifestyle changes due to CGM. We will increase his ozempic to 0.50 mg weekly and see him back in one month for further titration. Pt is currently splitting his insulin to 10 units BID stating he prefers it that way. If he continues to have hypoglycemic episodes we can have him inject his insulin in the am only.

## 2022-08-30 NOTE — Assessment & Plan Note (Addendum)
Pt has HTN and regimen includes amlodipine 5 mg, Coreg 25 mg BID, Entresto 49-51 mg BID, Jardiance 25 mg qd. States he does not have amlodipine but otherwise is very adherent to his regimen. Amlodipine does show being dispensed this month but will send in refill for this. Pt is hypertensive today which is likely due to being out of the amlodipine. Given his blood pressure is elevated and BMP performed showed overall stable renal function, we will increase his Entresto to the higher dose (see CHF). Otherwise pt is asymptomatic with no CP, HA, vision changes or other complaints.

## 2022-08-31 ENCOUNTER — Encounter: Payer: Self-pay | Admitting: Student

## 2022-09-07 ENCOUNTER — Ambulatory Visit: Payer: Medicare HMO | Admitting: Podiatry

## 2022-09-12 NOTE — Progress Notes (Signed)
[facility] Medicine Clinic Attending  Case discussed with Dr. Khan  at the time of the visit.  We reviewed the resident's history and exam and pertinent patient test results.  I agree with the assessment, diagnosis, and plan of care documented in the resident's note.  

## 2022-09-13 ENCOUNTER — Ambulatory Visit (AMBULATORY_SURGERY_CENTER): Payer: Medicare HMO | Admitting: *Deleted

## 2022-09-13 VITALS — Ht 74.5 in | Wt 270.0 lb

## 2022-09-13 DIAGNOSIS — Z1211 Encounter for screening for malignant neoplasm of colon: Secondary | ICD-10-CM

## 2022-09-13 MED ORDER — PLENVU 140 G PO SOLR
1.0000 | Freq: Once | ORAL | 0 refills | Status: AC
Start: 2022-09-13 — End: 2022-09-13

## 2022-09-13 NOTE — Progress Notes (Signed)
Pt's previsit is done over the phone and all paperwork (prep instructions) sent to patient.  Pt's name and DOB verified at the beginning of the previsit.  Pt denies any difficulty with ambulating.   No egg or soy allergy known to patient  No issues known to pt with past sedation with any surgeries or procedures Patient denies trouble neck No FH of Malignant Hyperthermia Pt is not on diet pills Pt is not on  home 02  Pt is not on blood thinners  Pt denies issues with constipation  Pt is not on dialysis Pt denies any upcoming cardiac testing Pt encouraged to use to use Singlecare or Goodrx to reduce cost

## 2022-09-14 ENCOUNTER — Ambulatory Visit: Payer: Medicare HMO | Admitting: Podiatry

## 2022-09-18 ENCOUNTER — Other Ambulatory Visit: Payer: Self-pay | Admitting: Internal Medicine

## 2022-09-18 ENCOUNTER — Emergency Department (HOSPITAL_COMMUNITY): Payer: Medicare HMO

## 2022-09-18 ENCOUNTER — Other Ambulatory Visit: Payer: Self-pay

## 2022-09-18 ENCOUNTER — Inpatient Hospital Stay (HOSPITAL_COMMUNITY)
Admission: EM | Admit: 2022-09-18 | Discharge: 2022-09-28 | DRG: 617 | Disposition: A | Discharge status: Home Health | Payer: Medicare HMO | Attending: Internal Medicine | Admitting: Internal Medicine

## 2022-09-18 ENCOUNTER — Encounter (HOSPITAL_COMMUNITY): Payer: Self-pay

## 2022-09-18 DIAGNOSIS — Z8673 Personal history of transient ischemic attack (TIA), and cerebral infarction without residual deficits: Secondary | ICD-10-CM | POA: Diagnosis not present

## 2022-09-18 DIAGNOSIS — I509 Heart failure, unspecified: Secondary | ICD-10-CM | POA: Diagnosis not present

## 2022-09-18 DIAGNOSIS — M86172 Other acute osteomyelitis, left ankle and foot: Secondary | ICD-10-CM | POA: Diagnosis not present

## 2022-09-18 DIAGNOSIS — N1831 Chronic kidney disease, stage 3a: Secondary | ICD-10-CM | POA: Diagnosis present

## 2022-09-18 DIAGNOSIS — M7989 Other specified soft tissue disorders: Secondary | ICD-10-CM | POA: Diagnosis not present

## 2022-09-18 DIAGNOSIS — T25321S Burn of third degree of right foot, sequela: Secondary | ICD-10-CM

## 2022-09-18 DIAGNOSIS — Z888 Allergy status to other drugs, medicaments and biological substances status: Secondary | ICD-10-CM | POA: Diagnosis not present

## 2022-09-18 DIAGNOSIS — Z794 Long term (current) use of insulin: Secondary | ICD-10-CM

## 2022-09-18 DIAGNOSIS — E11621 Type 2 diabetes mellitus with foot ulcer: Secondary | ICD-10-CM | POA: Diagnosis present

## 2022-09-18 DIAGNOSIS — I130 Hypertensive heart and chronic kidney disease with heart failure and stage 1 through stage 4 chronic kidney disease, or unspecified chronic kidney disease: Secondary | ICD-10-CM | POA: Diagnosis present

## 2022-09-18 DIAGNOSIS — Z5982 Transportation insecurity: Secondary | ICD-10-CM

## 2022-09-18 DIAGNOSIS — M00872 Arthritis due to other bacteria, left ankle and foot: Secondary | ICD-10-CM | POA: Diagnosis present

## 2022-09-18 DIAGNOSIS — D509 Iron deficiency anemia, unspecified: Secondary | ICD-10-CM | POA: Diagnosis present

## 2022-09-18 DIAGNOSIS — T25322S Burn of third degree of left foot, sequela: Secondary | ICD-10-CM

## 2022-09-18 DIAGNOSIS — E1169 Type 2 diabetes mellitus with other specified complication: Secondary | ICD-10-CM | POA: Diagnosis present

## 2022-09-18 DIAGNOSIS — E1142 Type 2 diabetes mellitus with diabetic polyneuropathy: Secondary | ICD-10-CM | POA: Diagnosis present

## 2022-09-18 DIAGNOSIS — N179 Acute kidney failure, unspecified: Secondary | ICD-10-CM | POA: Diagnosis not present

## 2022-09-18 DIAGNOSIS — I639 Cerebral infarction, unspecified: Secondary | ICD-10-CM | POA: Diagnosis not present

## 2022-09-18 DIAGNOSIS — E1149 Type 2 diabetes mellitus with other diabetic neurological complication: Secondary | ICD-10-CM | POA: Diagnosis not present

## 2022-09-18 DIAGNOSIS — M868X7 Other osteomyelitis, ankle and foot: Secondary | ICD-10-CM | POA: Diagnosis present

## 2022-09-18 DIAGNOSIS — H5462 Unqualified visual loss, left eye, normal vision right eye: Secondary | ICD-10-CM | POA: Diagnosis present

## 2022-09-18 DIAGNOSIS — I5022 Chronic systolic (congestive) heart failure: Secondary | ICD-10-CM | POA: Diagnosis present

## 2022-09-18 DIAGNOSIS — D638 Anemia in other chronic diseases classified elsewhere: Secondary | ICD-10-CM | POA: Diagnosis present

## 2022-09-18 DIAGNOSIS — S91302A Unspecified open wound, left foot, initial encounter: Secondary | ICD-10-CM | POA: Diagnosis not present

## 2022-09-18 DIAGNOSIS — Z833 Family history of diabetes mellitus: Secondary | ICD-10-CM

## 2022-09-18 DIAGNOSIS — Z634 Disappearance and death of family member: Secondary | ICD-10-CM

## 2022-09-18 DIAGNOSIS — Z7982 Long term (current) use of aspirin: Secondary | ICD-10-CM

## 2022-09-18 DIAGNOSIS — Z79899 Other long term (current) drug therapy: Secondary | ICD-10-CM | POA: Diagnosis not present

## 2022-09-18 DIAGNOSIS — E860 Dehydration: Secondary | ICD-10-CM | POA: Diagnosis not present

## 2022-09-18 DIAGNOSIS — I110 Hypertensive heart disease with heart failure: Secondary | ICD-10-CM | POA: Diagnosis not present

## 2022-09-18 DIAGNOSIS — L089 Local infection of the skin and subcutaneous tissue, unspecified: Secondary | ICD-10-CM | POA: Diagnosis present

## 2022-09-18 DIAGNOSIS — Z7984 Long term (current) use of oral hypoglycemic drugs: Secondary | ICD-10-CM

## 2022-09-18 DIAGNOSIS — E785 Hyperlipidemia, unspecified: Secondary | ICD-10-CM | POA: Diagnosis present

## 2022-09-18 DIAGNOSIS — I10 Essential (primary) hypertension: Secondary | ICD-10-CM | POA: Diagnosis not present

## 2022-09-18 DIAGNOSIS — Z299 Encounter for prophylactic measures, unspecified: Secondary | ICD-10-CM | POA: Diagnosis not present

## 2022-09-18 DIAGNOSIS — M869 Osteomyelitis, unspecified: Secondary | ICD-10-CM | POA: Diagnosis present

## 2022-09-18 DIAGNOSIS — L97529 Non-pressure chronic ulcer of other part of left foot with unspecified severity: Secondary | ICD-10-CM | POA: Diagnosis present

## 2022-09-18 DIAGNOSIS — E1122 Type 2 diabetes mellitus with diabetic chronic kidney disease: Secondary | ICD-10-CM | POA: Diagnosis present

## 2022-09-18 DIAGNOSIS — E11628 Type 2 diabetes mellitus with other skin complications: Secondary | ICD-10-CM | POA: Diagnosis present

## 2022-09-18 DIAGNOSIS — L97928 Non-pressure chronic ulcer of unspecified part of left lower leg with other specified severity: Secondary | ICD-10-CM | POA: Diagnosis not present

## 2022-09-18 DIAGNOSIS — Z7985 Long-term (current) use of injectable non-insulin antidiabetic drugs: Secondary | ICD-10-CM

## 2022-09-18 DIAGNOSIS — H401190 Primary open-angle glaucoma, unspecified eye, stage unspecified: Secondary | ICD-10-CM | POA: Diagnosis not present

## 2022-09-18 DIAGNOSIS — I77810 Thoracic aortic ectasia: Secondary | ICD-10-CM | POA: Diagnosis present

## 2022-09-18 LAB — BASIC METABOLIC PANEL
Anion gap: 6 (ref 5–15)
BUN: 20 mg/dL (ref 6–20)
CO2: 27 mmol/L (ref 22–32)
Calcium: 8.7 mg/dL — ABNORMAL LOW (ref 8.9–10.3)
Chloride: 103 mmol/L (ref 98–111)
Creatinine, Ser: 1.58 mg/dL — ABNORMAL HIGH (ref 0.61–1.24)
GFR, Estimated: 53 mL/min — ABNORMAL LOW (ref >60)
Glucose, Bld: 167 mg/dL — ABNORMAL HIGH (ref 70–99)
Potassium: 4.5 mmol/L (ref 3.5–5.1)
Sodium: 136 mmol/L (ref 135–145)

## 2022-09-18 LAB — CBC WITH DIFFERENTIAL/PLATELET
Abs Immature Granulocytes: 0.02 10*3/uL (ref 0.00–0.07)
Basophils Absolute: 0.1 10*3/uL (ref 0.0–0.1)
Basophils Relative: 1 %
Eosinophils Absolute: 0.3 10*3/uL (ref 0.0–0.5)
Eosinophils Relative: 3 %
HCT: 31.2 % — ABNORMAL LOW (ref 39.0–52.0)
Hemoglobin: 9.4 g/dL — ABNORMAL LOW (ref 13.0–17.0)
Immature Granulocytes: 0 %
Lymphocytes Relative: 25 %
Lymphs Abs: 2.1 10*3/uL (ref 0.7–4.0)
MCH: 21.6 pg — ABNORMAL LOW (ref 26.0–34.0)
MCHC: 30.1 g/dL (ref 30.0–36.0)
MCV: 71.7 fL — ABNORMAL LOW (ref 80.0–100.0)
Monocytes Absolute: 0.7 10*3/uL (ref 0.1–1.0)
Monocytes Relative: 8 %
Neutro Abs: 5.2 10*3/uL (ref 1.7–7.7)
Neutrophils Relative %: 63 %
Platelets: 387 10*3/uL (ref 150–400)
RBC: 4.35 MIL/uL (ref 4.22–5.81)
RDW: 14.6 % (ref 11.5–15.5)
WBC: 8.3 10*3/uL (ref 4.0–10.5)
nRBC: 0 % (ref 0.0–0.2)

## 2022-09-18 LAB — LACTIC ACID, PLASMA: Lactic Acid, Venous: 0.9 mmol/L (ref 0.5–1.9)

## 2022-09-18 MED ORDER — METRONIDAZOLE 500 MG/100ML IV SOLN
500.0000 mg | Freq: Two times a day (BID) | INTRAVENOUS | Status: AC
  Administered 2022-09-18 – 2022-09-25 (×14): 500 mg via INTRAVENOUS
  Filled 2022-09-18 (×14): qty 100

## 2022-09-18 MED ORDER — SODIUM CHLORIDE 0.9 % IV SOLN
2.0000 g | INTRAVENOUS | Status: AC
  Administered 2022-09-18 – 2022-09-24 (×7): 2 g via INTRAVENOUS
  Filled 2022-09-18 (×7): qty 20

## 2022-09-18 MED ORDER — ROSUVASTATIN CALCIUM 20 MG PO TABS
40.0000 mg | ORAL_TABLET | Freq: Every day | ORAL | Status: DC
  Administered 2022-09-19 – 2022-09-28 (×10): 40 mg via ORAL
  Filled 2022-09-18 (×10): qty 2

## 2022-09-18 MED ORDER — GADOBUTROL 1 MMOL/ML IV SOLN
10.0000 mL | Freq: Once | INTRAVENOUS | Status: AC | PRN
  Administered 2022-09-18: 10 mL via INTRAVENOUS

## 2022-09-18 MED ORDER — EMPAGLIFLOZIN 25 MG PO TABS
25.0000 mg | ORAL_TABLET | Freq: Every day | ORAL | Status: DC
  Administered 2022-09-19 – 2022-09-28 (×9): 25 mg via ORAL
  Filled 2022-09-18 (×10): qty 1

## 2022-09-18 MED ORDER — AMLODIPINE BESYLATE 5 MG PO TABS
5.0000 mg | ORAL_TABLET | Freq: Every day | ORAL | Status: DC
  Administered 2022-09-19 – 2022-09-28 (×9): 5 mg via ORAL
  Filled 2022-09-18 (×10): qty 1

## 2022-09-18 MED ORDER — INSULIN ASPART 100 UNIT/ML IJ SOLN
0.0000 [IU] | Freq: Three times a day (TID) | INTRAMUSCULAR | Status: DC
  Administered 2022-09-20: 3 [IU] via SUBCUTANEOUS
  Administered 2022-09-21 – 2022-09-22 (×3): 2 [IU] via SUBCUTANEOUS
  Administered 2022-09-23: 3 [IU] via SUBCUTANEOUS
  Administered 2022-09-25 – 2022-09-26 (×3): 2 [IU] via SUBCUTANEOUS
  Administered 2022-09-27: 3 [IU] via SUBCUTANEOUS
  Administered 2022-09-28: 2 [IU] via SUBCUTANEOUS
  Administered 2022-09-28: 3 [IU] via SUBCUTANEOUS

## 2022-09-18 MED ORDER — ENOXAPARIN SODIUM 40 MG/0.4ML IJ SOSY: 40.0000 mg | PREFILLED_SYRINGE | INTRAMUSCULAR | Status: DC

## 2022-09-18 MED ORDER — ACETAMINOPHEN 650 MG RE SUPP: 650.0000 mg | Freq: Four times a day (QID) | RECTAL | Status: DC | PRN

## 2022-09-18 MED ORDER — SACUBITRIL-VALSARTAN 97-103 MG PO TABS
1.0000 | ORAL_TABLET | Freq: Two times a day (BID) | ORAL | Status: DC
  Administered 2022-09-19 – 2022-09-28 (×19): 1 via ORAL
  Filled 2022-09-18 (×21): qty 1

## 2022-09-18 MED ORDER — INSULIN GLARGINE-YFGN 100 UNIT/ML ~~LOC~~ SOLN
10.0000 [IU] | Freq: Every day | SUBCUTANEOUS | Status: DC
  Administered 2022-09-19: 10 [IU] via SUBCUTANEOUS
  Filled 2022-09-18 (×4): qty 0.1

## 2022-09-18 MED ORDER — ACETAMINOPHEN 325 MG PO TABS
650.0000 mg | ORAL_TABLET | Freq: Four times a day (QID) | ORAL | Status: DC | PRN
  Filled 2022-09-18: qty 2

## 2022-09-18 MED ORDER — CARVEDILOL 12.5 MG PO TABS: 25.0000 mg | ORAL_TABLET | Freq: Two times a day (BID) | ORAL | Status: DC

## 2022-09-18 NOTE — ED Triage Notes (Signed)
Pt states he was using a pumus stone on bottom of left foot and broke the skin a week ago. Pt has wound on outer posterior of left foot 1cmx2.5x1cm. The skin surrounding the wound is is white and moist. The wound has greens soft scab on it. The wound has a foul smell.

## 2022-09-18 NOTE — Hospital Course (Addendum)
Subjective: No significant overnight events. Patient doing well this morning. He denies experiencing any pain in his left foot. He reports his performance with PT/OT was poor because he was confused regarding his weightbearing status. Explained recommendations for touchdown weightbearing and how he can rest his left foot on the floor while walking.  Physical Exam:  Constitutional: Appears stated age. Sitting upright in bed. Appears comfortable and in no acute distress. Cardiovascular: Regular rate, regular rhythm. No murmurs, rubs, or gallops. Extremities warm and well-perfused.  Pulmonary: Normal respiratory effort. No wheezes, rales, rhonchi, or crackles.   Abdominal: Soft. Non-distended. Normal bowel sounds. MSK: LLE with trace edema around ankle > RLE with no edema. No asymmetric erythema or calf tenderness. Neurological: Sensation diminished in left foot. Skin: Warm and dry. Wound vac in place on left foot.  ------------------------------------------------------------------------------------------------------------------- Assessment & Plan by Problem: Principal Problem:   Osteomyelitis of fifth toe of left foot (HCC) Active Problems:   HTN (hypertension)   Embolic stroke (HCC)   Type II diabetes mellitus with neurological manifestations (HCC)   Chronic systolic CHF (congestive heart failure) (HCC)   Hyperlipidemia associated with type 2 diabetes mellitus (HCC)   Microcytic anemia     Steven Cochran is a 52 y.o. person living with a history of HFrEF, stroke, hypertension, type 2 diabetes with neuropathy, hyperlipidemia, microcytic anemia, and left eye blindness who presented with left foot infection and admitted for IV antibiotics and possible operative management on 09/18/2022.   # Left fifth metatarsal and proximal phalanx osteomyelitis # Left fifth metatarsophalangeal joint septic arthritis # Hx of diabetic foot ulcer Patient presented with wound on left lateral forefoot that began  after using a pumice stone to shave off some skin. MRI on presentation demonstrated osteomyelitis of the fifth metatarsal and proximal phalanx with septic arthritis of the fifth metatarsophalangeal joint. Broad spectrum antibiotics were started by ED provider. Orthopedic surgery was consulted and recommended left fifth ray amputation. Patient underwent left fifth ray amputation on 5/22. Wound margins were clear. Broad-spectrum antibiotics discontinued with no indication for further antibiotic therapy at this time. Wound vac will remain in place until 1-week follow-up with Dr. Lajoyce Corners outpatient.   # Diabetes, type II with neurologic manifestations A1c 8.5 on 08/29/2022. Patient was taking insulin glargine 20 units daily, Jardiance 25 mg daily, and Ozempic 0.50 mg weekly at home. Blood glucose was well-controlled throughout hospitalization on Semglee 10 units, SSI, and Jardiance 25 mg daily. Will discharge with insulin glargine 15 units daily and Jardiance 25 mg daily.   # Combined CHF # Hypertension Last echo 1 year ago with EF of 45 to 50%. Aortic root dilatation noted. Patient remained euvolemic throughout hospitalization. Blood pressure elevated this admission. Will start spironolactone 12.5 mg daily at discharge. Will also continue amlodipine 5 mg daily, carvedilol 25 mg twice daily, and Entresto 97-103 mg twice daily.   # History embolic stroke # Hyperlipidemia Held aspirin on admission due to potential for surgical intervention. VTE prophylaxis with Lovenox continued throughout hospitalization. Will resume aspirin at discharge. Will continue Crestor 40 mg daily at discharge. ----------------------------------------------------------------------------------------------------  5/16 No cp or sob, no fevers or chills overnight says his feet are not hurting Discussed plan with patient, understands and amenable   5-21 Mr Steven Cochran was feeling okay this morning, able to walk around the room yesterday.  Enjoyed breakfast this morning. Denies chest or abdominal pain. Discussed plan for amputation tomorrow morning and to continue antibiotics. Explained expected timeline of antibiotic culture growth and wound vacuum  dressing. All questions were answered.  5-22 Mr Steven Cochran was feeling okay this morning, denies any recent changes in foot pain. Questioned usefulness of MRI and wanted to know more about heart failure diagnosis. Discussed plan for amputation, which will allow visualization of margins. Explained diagnosis of moderate heart failure. All questions answered.   5/24: Feels ok this morning. Had a little bit of trouble with PT/OT yesterday, felt like he got pretty tired quickly yesterday. Some confusion about the weightbearing instructions.

## 2022-09-18 NOTE — ED Provider Notes (Signed)
[place] EMERGENCY DEPARTMENT AT [facility] Provider Note   CSN: [id number] Arrival date & time: 09/18/22  1329     History  Chief Complaint  Patient presents with   wound    Steven Cochran is a 52 y.o. male.  Patient presents the emergency department complaining of a wound to the left lateral foot.  Patient states he was using a pumice stone on the bottom of his left foot and accidentally broke skin approxi-1 week ago.  Since that time the wound has not healed.  It is draining.  Measurements are 1 cm x 2.5 cm x 1 cm.  The wound does have a foul smell and has skin surrounding it which is white and moist.  Patient does have type II DM, current A1c 6.7.  Chronic CHF, hypertension, embolic stroke, previous diabetic foot ulcer left foot, microcytic anemia  HPI     Home Medications Prior to Admission medications   Medication Sig Start Date End Date Taking? Authorizing Provider  amLODipine (NORVASC) 5 MG tablet Take 1 tablet (5 mg total) by mouth daily. 08/29/22  Yes Gwenevere Abbot, MD  aspirin EC 81 MG EC tablet Take 1 tablet (81 mg total) by mouth daily. Swallow whole. 09/04/21  Yes Leroy Sea, MD  carvedilol (COREG) 25 MG tablet Take 1 tablet (25 mg total) by mouth 2 (two) times daily. 07/12/22 11/09/22 Yes Gwenevere Abbot, MD  empagliflozin (JARDIANCE) 25 MG TABS tablet Take 1 tablet (25 mg total) by mouth daily. 07/12/22  Yes Gwenevere Abbot, MD  insulin glargine (LANTUS) 100 UNIT/ML Solostar Pen Inject 20 Units into the skin daily. 07/12/22  Yes Gwenevere Abbot, MD  rosuvastatin (CRESTOR) 40 MG tablet Take 1 tablet (40 mg total) by mouth daily. 07/12/22  Yes Gwenevere Abbot, MD  sacubitril-valsartan (ENTRESTO) 97-103 MG Take 1 tablet by mouth 2 (two) times daily. 08/30/22 08/30/23 Yes Gwenevere Abbot, MD  Semaglutide,0.25 or 0.5MG /DOS, 2 MG/3ML SOPN Inject 0.5 mg into the skin once a week. 08/29/22 09/28/22 Yes Gwenevere Abbot, MD  Blood Glucose Monitoring Suppl (FREESTYLE LITE) w/Device KIT Use as  directed 06/15/22   Lanae Boast, MD  Continuous Blood Gluc Sensor (FREESTYLE LIBRE 3 SENSOR) MISC 1 Device by Does not apply route every 14 (fourteen) days. Place 1 sensor on the skin every 14 days. Use to check glucose continuously 07/16/22 10/14/22  Gwenevere Abbot, MD  glucose blood (FREESTYLE LITE) test strip Monitor blood sugar at home 06/15/22   Lanae Boast, MD  Insulin Pen Needle 32G X 4 MM MISC Use with Basaglar. 07/12/22   Gwenevere Abbot, MD  Lancets (FREESTYLE) lancets Use a directed 06/15/22   Lanae Boast, MD      Allergies    Metformin and related    Review of Systems   Review of Systems  Physical Exam Updated Vital Signs BP (!) 173/89 (BP Location: Right Arm)   Pulse 83   Temp 98.1 F (36.7 C) (Oral)   Resp 16   Ht 6' 2.5" (1.892 m)   Wt 122.5 kg   SpO2 100%   BMI 34.21 kg/m  Physical Exam Vitals and nursing note reviewed.  HENT:     Head: Normocephalic and atraumatic.  Eyes:     Pupils: Pupils are equal, round, and reactive to light.  Pulmonary:     Effort: Pulmonary effort is normal. No respiratory distress.  Musculoskeletal:        General: No signs of injury.     Cervical back: Normal range  of motion.  Skin:    General: Skin is dry.     Comments: 1cm x 2.5cm x 1cm wound to lateral left foot near the small toe. Mild erythema, drainage.   Neurological:     Mental Status: He is alert.  Psychiatric:        Speech: Speech normal.        Behavior: Behavior normal.     ED Results / Procedures / Treatments   Labs (all labs ordered are listed, but only abnormal results are displayed) Labs Reviewed  BASIC METABOLIC PANEL - Abnormal; Notable for the following components:      Result Value   Glucose, Bld 167 (*)    Creatinine, Ser 1.58 (*)    Calcium 8.7 (*)    GFR, Estimated 53 (*)    All other components within normal limits  CBC WITH DIFFERENTIAL/PLATELET - Abnormal; Notable for the following components:   Hemoglobin 9.4 (*)    HCT 31.2 (*)    MCV 71.7 (*)    MCH  21.6 (*)    All other components within normal limits  CULTURE, BLOOD (ROUTINE X 2)  CULTURE, BLOOD (ROUTINE X 2)  LACTIC ACID, PLASMA    EKG None  Radiology MR FOOT LEFT W WO CONTRAST  Result Date: 09/18/2022 CLINICAL DATA:  Osteomyelitis suspected. EXAM: MRI OF THE LEFT FOREFOOT WITHOUT AND WITH CONTRAST TECHNIQUE: Multiplanar, multisequence MR imaging of the left forefoot was performed both before and after administration of intravenous contrast. CONTRAST:  10mL GADAVIST GADOBUTROL 1 MMOL/ML IV SOLN COMPARISON:  Radiographs dated Sep 18, 2018 FINDINGS: Bones/Joint/Cartilage Bone marrow edema of the fifth metacarpal and proximal phalanx with subluxation at the fifth metatarsophalangeal joint consistent with osteomyelitis with septic arthritis. Ligaments Lisfranc and collateral ligaments appear intact. Muscles and Tendons Increased intrasubstance signal of the plantar muscles and tendons. No fluid collection or abscess. Deep skin Soft tissues Deep skin wound with subcutaneous emphysema and marked surrounding inflammatory changes about the fifth metatarsal head and proximal phalanx. IMPRESSION: 1. Osteomyelitis of the fifth metatarsal and proximal phalanx with septic arthritis of the fifth metatarsophalangeal joint. 2. Deep skin wound with subcutaneous emphysema and marked surrounding inflammatory changes about the fifth metatarsal head and proximal phalanx. No fluid collection or abscess. Electronically Signed   By: Larose Hires D.O.   On: 09/18/2022 21:44   DG Foot Complete Left  Result Date: 09/18/2022 CLINICAL DATA:  LATERAL LEFT foot wound. EXAM: LEFT FOOT - COMPLETE 3+ VIEW COMPARISON:  06/13/2022 FINDINGS: Gas in the soft tissue overlying the 5th MTP joint noted with apparent subluxation/dislocation at the 5th MTP joint. Irregularity of the 5th metatarsal head is noted suggesting traumatic injury or osteomyelitis. Vascular calcifications are present. Surgical staple overlying the great toe  again noted. IMPRESSION: Gas in the soft tissue overlying the 5th MTP joint with apparent subluxation/dislocation at the 5th MTP joint. Irregularity of the 5th metatarsal head suggesting traumatic injury or osteomyelitis. Consider MRI for further evaluation. Electronically Signed   By: Harmon Pier M.D.   On: 09/18/2022 16:14    Procedures Procedures    Medications Ordered in ED Medications  cefTRIAXone (ROCEPHIN) 2 g in sodium chloride 0.9 % 100 mL IVPB (0 g Intravenous Stopped 09/18/22 2247)    And  metroNIDAZOLE (FLAGYL) IVPB 500 mg (500 mg Intravenous New Bag/Given 09/18/22 2219)  gadobutrol (GADAVIST) 1 MMOL/ML injection 10 mL (10 mLs Intravenous Contrast Given 09/18/22 2123)    ED Course/ Medical Decision Making/ A&P  Medical Decision Making Amount and/or Complexity of Data Reviewed Labs: ordered. Radiology: ordered.  Risk Prescription drug management. Decision regarding hospitalization.   This patient presents to the ED for concern of wound to lateral left foot, this involves an extensive number of treatment options, and is a complaint that carries with it a high risk of complications and morbidity.  The differential diagnosis includes diabetic foot ulcer, cellulitis, gangrene, abscess   Co morbidities that complicate the patient evaluation  Diabetes   Additional history obtained:   External records from outside source obtained and reviewed including primary care notes from 4/24   Lab Tests:  I Ordered, and personally interpreted labs.  The pertinent results include:  Hgb 9.4, creatinine 1.58 (mildly elevated from 1.35 baseline)   Imaging Studies ordered:  I ordered imaging studies including plain films of the left foot  I independently visualized and interpreted imaging which showed  Gas in the soft tissue overlying the 5th MTP joint with apparent  subluxation/dislocation at the 5th MTP joint. Irregularity of the  5th metatarsal head  suggesting traumatic injury or osteomyelitis  MR of the left foot ordered 1. Osteomyelitis of the fifth metatarsal and proximal phalanx with  septic arthritis of the fifth metatarsophalangeal joint.  2. Deep skin wound with subcutaneous emphysema and marked  surrounding inflammatory changes about the fifth metatarsal head and  proximal phalanx. No fluid collection or abscess   I agree with the radiologist interpretation    Consultations Obtained:  I requested consultation with the internal medicine team,  and discussed lab and imaging findings as well as pertinent plan - they recommend: admission I requested consultation with Dr.Yates, [facility]. He asked that I add Dr.Duda to the treatment team so that he will be added to his list. He had no recommendations at this time. Stated that Dr.Duda could officially consult tomorrow AM    Problem List / ED Course / Critical interventions / Medication management   I ordered medication including Rocephin and Flagyl for possible osteomyelitis Reevaluation of the patient after these medicines showed that the patient stayed the same I have reviewed the patients home medicines and have made adjustments as needed     Test / Admission - Considered:  Patient with osteomyelitis of the fifth metatarsal and proximal phalanx with septic arthritis of the fifth metatarsophalangeal joint.  Patient will need admission for IV antibiotics and further evaluation by orthopedic surgery.  Admit to hospital.         Final Clinical Impression(s) / ED Diagnoses Final diagnoses:  Osteomyelitis of left foot, unspecified type [facility])    Rx / DC Orders ED Discharge Orders     None         Pamala Duffel 09/18/22 2307    Terrilee Files, MD 09/19/22 1705

## 2022-09-19 ENCOUNTER — Other Ambulatory Visit: Payer: Self-pay

## 2022-09-19 ENCOUNTER — Observation Stay (HOSPITAL_COMMUNITY): Payer: Medicare HMO

## 2022-09-19 DIAGNOSIS — I130 Hypertensive heart and chronic kidney disease with heart failure and stage 1 through stage 4 chronic kidney disease, or unspecified chronic kidney disease: Secondary | ICD-10-CM | POA: Diagnosis present

## 2022-09-19 DIAGNOSIS — M869 Osteomyelitis, unspecified: Secondary | ICD-10-CM | POA: Diagnosis not present

## 2022-09-19 DIAGNOSIS — I5022 Chronic systolic (congestive) heart failure: Secondary | ICD-10-CM

## 2022-09-19 DIAGNOSIS — E785 Hyperlipidemia, unspecified: Secondary | ICD-10-CM

## 2022-09-19 DIAGNOSIS — I10 Essential (primary) hypertension: Secondary | ICD-10-CM | POA: Diagnosis not present

## 2022-09-19 DIAGNOSIS — N1831 Chronic kidney disease, stage 3a: Secondary | ICD-10-CM | POA: Diagnosis present

## 2022-09-19 DIAGNOSIS — T25322S Burn of third degree of left foot, sequela: Secondary | ICD-10-CM | POA: Diagnosis not present

## 2022-09-19 DIAGNOSIS — E1122 Type 2 diabetes mellitus with diabetic chronic kidney disease: Secondary | ICD-10-CM | POA: Diagnosis present

## 2022-09-19 DIAGNOSIS — M86172 Other acute osteomyelitis, left ankle and foot: Secondary | ICD-10-CM

## 2022-09-19 DIAGNOSIS — Z888 Allergy status to other drugs, medicaments and biological substances status: Secondary | ICD-10-CM | POA: Diagnosis not present

## 2022-09-19 DIAGNOSIS — Z794 Long term (current) use of insulin: Secondary | ICD-10-CM | POA: Diagnosis not present

## 2022-09-19 DIAGNOSIS — E1169 Type 2 diabetes mellitus with other specified complication: Secondary | ICD-10-CM | POA: Diagnosis present

## 2022-09-19 DIAGNOSIS — E860 Dehydration: Secondary | ICD-10-CM | POA: Diagnosis not present

## 2022-09-19 DIAGNOSIS — T25321S Burn of third degree of right foot, sequela: Secondary | ICD-10-CM | POA: Diagnosis not present

## 2022-09-19 DIAGNOSIS — N179 Acute kidney failure, unspecified: Secondary | ICD-10-CM | POA: Diagnosis not present

## 2022-09-19 DIAGNOSIS — M7989 Other specified soft tissue disorders: Secondary | ICD-10-CM | POA: Diagnosis not present

## 2022-09-19 DIAGNOSIS — E1149 Type 2 diabetes mellitus with other diabetic neurological complication: Secondary | ICD-10-CM

## 2022-09-19 DIAGNOSIS — Z7985 Long-term (current) use of injectable non-insulin antidiabetic drugs: Secondary | ICD-10-CM

## 2022-09-19 DIAGNOSIS — Z8673 Personal history of transient ischemic attack (TIA), and cerebral infarction without residual deficits: Secondary | ICD-10-CM | POA: Diagnosis not present

## 2022-09-19 DIAGNOSIS — E1142 Type 2 diabetes mellitus with diabetic polyneuropathy: Secondary | ICD-10-CM | POA: Diagnosis present

## 2022-09-19 DIAGNOSIS — L97928 Non-pressure chronic ulcer of unspecified part of left lower leg with other specified severity: Secondary | ICD-10-CM | POA: Diagnosis not present

## 2022-09-19 DIAGNOSIS — I77810 Thoracic aortic ectasia: Secondary | ICD-10-CM | POA: Diagnosis present

## 2022-09-19 DIAGNOSIS — L97529 Non-pressure chronic ulcer of other part of left foot with unspecified severity: Secondary | ICD-10-CM | POA: Diagnosis present

## 2022-09-19 DIAGNOSIS — I509 Heart failure, unspecified: Secondary | ICD-10-CM | POA: Diagnosis not present

## 2022-09-19 DIAGNOSIS — D509 Iron deficiency anemia, unspecified: Secondary | ICD-10-CM | POA: Diagnosis present

## 2022-09-19 DIAGNOSIS — H5462 Unqualified visual loss, left eye, normal vision right eye: Secondary | ICD-10-CM | POA: Diagnosis present

## 2022-09-19 DIAGNOSIS — Z7984 Long term (current) use of oral hypoglycemic drugs: Secondary | ICD-10-CM | POA: Diagnosis not present

## 2022-09-19 DIAGNOSIS — I110 Hypertensive heart disease with heart failure: Secondary | ICD-10-CM

## 2022-09-19 DIAGNOSIS — M868X7 Other osteomyelitis, ankle and foot: Secondary | ICD-10-CM | POA: Diagnosis present

## 2022-09-19 DIAGNOSIS — E11621 Type 2 diabetes mellitus with foot ulcer: Secondary | ICD-10-CM | POA: Diagnosis present

## 2022-09-19 DIAGNOSIS — E11628 Type 2 diabetes mellitus with other skin complications: Secondary | ICD-10-CM | POA: Diagnosis present

## 2022-09-19 DIAGNOSIS — Z79899 Other long term (current) drug therapy: Secondary | ICD-10-CM | POA: Diagnosis not present

## 2022-09-19 DIAGNOSIS — M00872 Arthritis due to other bacteria, left ankle and foot: Secondary | ICD-10-CM

## 2022-09-19 DIAGNOSIS — L089 Local infection of the skin and subcutaneous tissue, unspecified: Secondary | ICD-10-CM | POA: Diagnosis present

## 2022-09-19 LAB — BASIC METABOLIC PANEL
Anion gap: 10 (ref 5–15)
BUN: 17 mg/dL (ref 6–20)
CO2: 23 mmol/L (ref 22–32)
Calcium: 8.4 mg/dL — ABNORMAL LOW (ref 8.9–10.3)
Chloride: 104 mmol/L (ref 98–111)
Creatinine, Ser: 1.38 mg/dL — ABNORMAL HIGH (ref 0.61–1.24)
GFR, Estimated: >60 mL/min (ref >60)
Glucose, Bld: 110 mg/dL — ABNORMAL HIGH (ref 70–99)
Potassium: 3.9 mmol/L (ref 3.5–5.1)
Sodium: 137 mmol/L (ref 135–145)

## 2022-09-19 LAB — PROTIME-INR
INR: 1.1 (ref 0.8–1.2)
Prothrombin Time: 14.3 seconds (ref 11.4–15.2)

## 2022-09-19 LAB — GLUCOSE, CAPILLARY
Glucose-Capillary: 112 mg/dL — ABNORMAL HIGH (ref 70–99)
Glucose-Capillary: 108 mg/dL — ABNORMAL HIGH (ref 70–99)
Glucose-Capillary: 118 mg/dL — ABNORMAL HIGH (ref 70–99)
Glucose-Capillary: 130 mg/dL — ABNORMAL HIGH (ref 70–99)

## 2022-09-19 LAB — IRON AND TIBC
Iron: 29 ug/dL — ABNORMAL LOW (ref 45–182)
Saturation Ratios: 18 % (ref 17.9–39.5)
TIBC: 165 ug/dL — ABNORMAL LOW (ref 250–450)
UIBC: 136 ug/dL

## 2022-09-19 LAB — CBC
HCT: 29.1 % — ABNORMAL LOW (ref 39.0–52.0)
Hemoglobin: 8.7 g/dL — ABNORMAL LOW (ref 13.0–17.0)
MCH: 21.6 pg — ABNORMAL LOW (ref 26.0–34.0)
MCHC: 29.9 g/dL — ABNORMAL LOW (ref 30.0–36.0)
MCV: 72.2 fL — ABNORMAL LOW (ref 80.0–100.0)
Platelets: 320 10*3/uL (ref 150–400)
RBC: 4.03 MIL/uL — ABNORMAL LOW (ref 4.22–5.81)
RDW: 14.7 % (ref 11.5–15.5)
WBC: 6.7 10*3/uL (ref 4.0–10.5)
nRBC: 0 % (ref 0.0–0.2)

## 2022-09-19 LAB — FERRITIN: Ferritin: 555 ng/mL — ABNORMAL HIGH (ref 24–336)

## 2022-09-19 LAB — APTT: aPTT: 36 seconds (ref 24–36)

## 2022-09-19 MED ORDER — VANCOMYCIN HCL IN DEXTROSE 1-5 GM/200ML-% IV SOLN
1000.0000 mg | Freq: Two times a day (BID) | INTRAVENOUS | Status: DC
  Administered 2022-09-19 – 2022-09-20 (×4): 1000 mg via INTRAVENOUS
  Filled 2022-09-19 (×4): qty 200

## 2022-09-19 MED ORDER — CARVEDILOL 25 MG PO TABS
25.0000 mg | ORAL_TABLET | Freq: Two times a day (BID) | ORAL | Status: DC
  Administered 2022-09-19 – 2022-09-28 (×20): 25 mg via ORAL
  Filled 2022-09-19 (×17): qty 1
  Filled 2022-09-19: qty 2
  Filled 2022-09-19 (×2): qty 1

## 2022-09-19 MED ORDER — FREESTYLE LIBRE 3 SENSOR MISC
1.0000 | 2 refills | Status: DC
Start: 2022-09-19 — End: 2022-09-24

## 2022-09-19 MED ORDER — VANCOMYCIN HCL 2000 MG/400ML IV SOLN
2000.0000 mg | Freq: Once | INTRAVENOUS | Status: AC
  Administered 2022-09-19: 2000 mg via INTRAVENOUS
  Filled 2022-09-19: qty 400

## 2022-09-19 MED ORDER — ASPIRIN 81 MG PO TBEC: 81.0000 mg | DELAYED_RELEASE_TABLET | Freq: Every day | ORAL | Status: DC
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
## 2022-09-19 NOTE — Progress Notes (Signed)
Bilateral lower extremity venous duplex has been completed. Preliminary results can be found in CV Proc through chart review.   09/19/22 1:29 PM Olen Cordial RVT

## 2022-09-19 NOTE — Progress Notes (Signed)
Pt refused to remove shoe on right foot for skin inspection. Pt denies any ulcers or skin issues on the right foot. Left midfoot wound with scant bleeding and foul odor, cleansed with sterile saline, top with vaseline gauze and wrapped with kerlix.

## 2022-09-19 NOTE — Progress Notes (Signed)
Subjective:  Patient has no complaints this morning. He reports his left foot is not causing him any pain. He denies fevers, chills, nausea, and vomiting.   Objective:  Vital signs in last 24 hours: Vitals:   09/18/22 2223 09/19/22 0043 09/19/22 0240 09/19/22 0835  BP: (!) 173/89 (!) 162/94 (!) 157/89 (!) 157/89  Pulse: 83 82 83 88  Resp: 16 14 18 19   Temp: 98.1 F (36.7 C) 98.2 F (36.8 C) 98.4 F (36.9 C) 98.2 F (36.8 C)  TempSrc: Oral Oral Oral Oral  SpO2: 100% 100% 100% 100%  Weight:      Height:       Intake/Output Summary (Last 24 hours) at 09/19/2022 4098 Last data filed at 09/18/2022 2338 Gross per 24 hour  Intake 200 ml  Output --  Net 200 ml   Physical Exam: Constitutional: Middle-aged male lying in bed. Appears comfortable and in no acute distress. HENT: Normocephalic and atraumatic. Mucous membranes moist. Neck: Normal range of motion.  Cardiovascular: Regular rate, regular rhythm. No murmurs, rubs, or gallops. Left DP and PT pulses unable to be palpated due to edema, but extremities warm and well-perfused. Pulmonary: Normal respiratory effort. No wheezes, rales, rhonchi, or crackles.   Abdominal: Soft. Non-distended. No tenderness. Normal bowel sounds.      MSK: LLE with +2 pitting edema to mid-calf. RLE with +1 pitting edema around ankle. No asymmetric erythema or calf tenderness. Neurological: Alert and oriented to person, place, and time. Sensation diminished in bilateral feet. Skin: Warm and dry. Wound on lateral left foot near base of fifth toe with serosanguinous drainage, surrounding erythema, and foul smell. Right foot without any ulcers.  Assessment/Plan:  Principal Problem:   Osteomyelitis of fifth toe of left foot (HCC) Active Problems:   HTN (hypertension)   Embolic stroke (HCC)   Type II diabetes mellitus with neurological manifestations (HCC)   Chronic systolic CHF (congestive heart failure) (HCC)   Hyperlipidemia associated with type 2  diabetes mellitus (HCC)   Microcytic anemia   # Left fifth metatarsal and proximal phalanx osteomyelitis # Left fifth metatarsophalangeal joint septic arthritis # Diabetic foot infection Patient denies experiencing any pain in his left foot. He remains afebrile. No leukocytosis with WBC of 8.3 this morning. LLE edema > RLE edema, though no asymmetric erythema or tenderness. Suspect likely due to patient not wearing compression socks recently. Vascular US lower extremity ordered for today to rule out DVT. Orthopedics will also see him today to evaluate for any surgical intervention. Will continue IV ceftriaxone, metronidazole, and vancomycin.  - Appreciate orthopedic surgery recommendations - IV ceftriaxone, metronidazole, vancomycin - Follow-up blood cultures - Follow-up vascular US lower extremity - Trend CBC  # Diabetes, type II with neurologic manifestations  Fasting glucose 110 this morning. Patient reports tight control of blood glucose at home. Will hold Semglee 10 units daily and SSI today given potential for surgery. Will continue Jardiance 25 mg daily. - Hold Semglee 10 units daily - Hold SSI - Jardiance 25 mg daily  # CHF (EF 45-50%)  # Hypertension Patient with no signs of volume overload on exam. Blood pressure elevated this morning with MAPs in the 100-110s before receiving medications. Will continue monitoring blood pressure and titrate up antihypertensives as needed. - Amlodipine 5 mg daily - Carvedilol 25 mg twice daily - Entresto 97-103 mg twice daily - Daily weights - Strict I's and O's  # CKD stage 3a Creatinine back to baseline at 1.38 today. Will continue monitoring  renal function. - Trend BMP  # Previous CVA with no residual deficits # Hyperlipidemia Holding aspirin until orthopedic surgery evaluates patient. Will continue Crestor 40 mg daily - Hold aspirin - Crestor 40 mg  Diet: NPO (will start after ortho evaluates) Bowel: None VTE: None (holding until  ortho evaluates) IVF: None Code: Full LOS: day 1  Prior to Admission Living Arrangement: Home, living alone Anticipated Discharge Location: Home Barriers to Discharge: Continued management  Whitman Hero, Medical Student 09/19/2022, 9:08 AM

## 2022-09-19 NOTE — Progress Notes (Signed)
Pharmacy Antibiotic Note  Khrystopher Mcalpine is a 52 y.o. male admitted on 09/18/2022 with  osteomyelitis .  Pharmacy has been consulted for vancomycin dosing.  Plan: Vancomycin 2000mg  x1 then 1000mg  IV Q12H. Goal AUC 400-550.  Expected AUC 500.  SCr used 1.4.  Height: 6' 2.5" (189.2 cm) Weight: 122.5 kg (270 lb 1 oz) IBW/kg (Calculated) : 83.35  Temp (24hrs), Avg:98.4 F (36.9 C), Min:98.1 F (36.7 C), Max:99.1 F (37.3 C)  Recent Labs  Lab 09/18/22 1350 09/18/22 1957  WBC 8.3  --   CREATININE 1.58*  --   LATICACIDVEN  --  0.9    Estimated Creatinine Clearance: 78.3 mL/min (A) (by C-G formula based on SCr of 1.58 mg/dL (H)).    Allergies  Allergen Reactions   Metformin And Related Diarrhea    Thank you for allowing pharmacy to be a part of this patient's care.  Vernard Gambles, PharmD, BCPS  09/19/2022 1:32 AM

## 2022-09-19 NOTE — ED Notes (Signed)
ED TO INPATIENT HANDOFF REPORT  ED Nurse Name and Phone #: Johnna Acosta [phone]  S Name/Age/Gender Steven Cochran Mckesson 52 y.o. male Room/Bed: 020C/020C  Code Status   Code Status: Full Code  Home/SNF/Other Home Patient oriented to: self, place, time, and situation Is this baseline? Yes   Triage Complete: Triage complete  Chief Complaint Osteomyelitis of fifth toe of left foot (HCC) [M86.9]  Triage Note Pt states he was using a pumus stone on bottom of left foot and broke the skin a week ago. Pt has wound on outer posterior of left foot 1cmx2.5x1cm. The skin surrounding the wound is is white and moist. The wound has greens soft scab on it. The wound has a foul smell.       Allergies Allergies  Allergen Reactions   Metformin And Related Diarrhea    Level of Care/Admitting Diagnosis ED Disposition     ED Disposition  Admit   Condition  --   Comment  Hospital Area: [facility] [[id number]]  Level of Care: Med-Surg [16]  May place patient in observation at [facility] or [facility] if equivalent level of care is available:: No  Covid Evaluation: Asymptomatic - no recent exposure (last 10 days) testing not required  Diagnosis: Osteomyelitis of fifth toe of left foot [facility]) [[id number]]  Admitting Physician: Gust Rung [[id number]]  Attending Physician: Gust Rung [[id number]]          B Medical/Surgery History Past Medical History:  Diagnosis Date   Cataract    CHF (congestive heart failure) (HCC)    Diabetes mellitus    Hypertension    Past Surgical History:  Procedure Laterality Date   CATARACT EXTRACTION Bilateral    SKIN GRAFT     feet     A IV Location/Drains/Wounds Patient Lines/Drains/Airways Status     Active Line/Drains/Airways     Name Placement date Placement time Site Days   Peripheral IV 09/18/22 20 G Left Wrist 09/18/22  2013  Wrist  1   Wound / Incision (Open or Dehisced) 06/14/22 Non-pressure wound Foot Left 06/14/22  0210  Foot  97             Intake/Output Last 24 hours  Intake/Output Summary (Last 24 hours) at 09/19/2022 0059 Last data filed at 09/18/2022 2338 Gross per 24 hour  Intake 200 ml  Output --  Net 200 ml    Labs/Imaging Results for orders placed or performed during the hospital encounter of 09/18/22 (from the past 48 hour(s))  Basic metabolic panel     Status: Abnormal   Collection Time: 09/18/22  1:50 PM  Result Value Ref Range   Sodium 136 135 - 145 mmol/L   Potassium 4.5 3.5 - 5.1 mmol/L   Chloride 103 98 - 111 mmol/L   CO2 27 22 - 32 mmol/L   Glucose, Bld 167 (H) 70 - 99 mg/dL    Comment: Glucose reference range applies only to samples taken after fasting for at least 8 hours.   BUN 20 6 - 20 mg/dL   Creatinine, Ser 1.91 (H) 0.61 - 1.24 mg/dL   Calcium 8.7 (L) 8.9 - 10.3 mg/dL   GFR, Estimated 53 (L) >60 mL/min    Comment: (NOTE) Calculated using the CKD-EPI Creatinine Equation (2021)    Anion gap 6 5 - 15    Comment: Performed at [facility], [address], [place], [place] [address]  CBC with Differential     Status: Abnormal  Collection Time: 09/18/22  1:50 PM  Result Value Ref Range   WBC 8.3 4.0 - 10.5 K/uL   RBC 4.35 4.22 - 5.81 MIL/uL   Hemoglobin 9.4 (L) 13.0 - 17.0 g/dL   HCT 21.3 (L) 08.6 - 57.8 %   MCV 71.7 (L) 80.0 - 100.0 fL   MCH 21.6 (L) 26.0 - 34.0 pg   MCHC 30.1 30.0 - 36.0 g/dL   RDW 46.9 62.9 - 52.8 %   Platelets 387 150 - 400 K/uL   nRBC 0.0 0.0 - 0.2 %   Neutrophils Relative % 63 %   Neutro Abs 5.2 1.7 - 7.7 K/uL   Lymphocytes Relative 25 %   Lymphs Abs 2.1 0.7 - 4.0 K/uL   Monocytes Relative 8 %   Monocytes Absolute 0.7 0.1 - 1.0 K/uL   Eosinophils Relative 3 %   Eosinophils Absolute 0.3 0.0 - 0.5 K/uL   Basophils Relative 1 %   Basophils Absolute 0.1 0.0 - 0.1 K/uL   Immature Granulocytes 0 %   Abs Immature Granulocytes 0.02 0.00 - 0.07 K/uL    Comment: Performed at [facility], [address], [place], [place] [address]  Lactic  acid     Status: None   Collection Time: 09/18/22  7:57 PM  Result Value Ref Range   Lactic Acid, Venous 0.9 0.5 - 1.9 mmol/L    Comment: Performed at [facility], [address] [place], [place] [address]   MR FOOT LEFT W WO CONTRAST  Result Date: 09/18/2022 CLINICAL DATA:  Osteomyelitis suspected. EXAM: MRI OF THE LEFT FOREFOOT WITHOUT AND WITH CONTRAST TECHNIQUE: Multiplanar, multisequence MR imaging of the left forefoot was performed both before and after administration of intravenous contrast. CONTRAST:  10mL GADAVIST GADOBUTROL 1 MMOL/ML IV SOLN COMPARISON:  Radiographs dated Sep 18, 2018 FINDINGS: Bones/Joint/Cartilage Bone marrow edema of the fifth metacarpal and proximal phalanx with subluxation at the fifth metatarsophalangeal joint consistent with osteomyelitis with septic arthritis. Ligaments Lisfranc and collateral ligaments appear intact. Muscles and Tendons Increased intrasubstance signal of the plantar muscles and tendons. No fluid collection or abscess. Deep skin Soft tissues Deep skin wound with subcutaneous emphysema and marked surrounding inflammatory changes about the fifth metatarsal head and proximal phalanx. IMPRESSION: 1. Osteomyelitis of the fifth metatarsal and proximal phalanx with septic arthritis of the fifth metatarsophalangeal joint. 2. Deep skin wound with subcutaneous emphysema and marked surrounding inflammatory changes about the fifth metatarsal head and proximal phalanx. No fluid collection or abscess. Electronically Signed   By: Larose Hires D.O.   On: 09/18/2022 21:44   DG Foot Complete Left  Result Date: 09/18/2022 CLINICAL DATA:  LATERAL LEFT foot wound. EXAM: LEFT FOOT - COMPLETE 3+ VIEW COMPARISON:  06/13/2022 FINDINGS: Gas in the soft tissue overlying the 5th MTP joint noted with apparent subluxation/dislocation at the 5th MTP joint. Irregularity of the 5th metatarsal head is noted suggesting traumatic injury or osteomyelitis. Vascular calcifications  are present. Surgical staple overlying the great toe again noted. IMPRESSION: Gas in the soft tissue overlying the 5th MTP joint with apparent subluxation/dislocation at the 5th MTP joint. Irregularity of the 5th metatarsal head suggesting traumatic injury or osteomyelitis. Consider MRI for further evaluation. Electronically Signed   By: Harmon Pier M.D.   On: 09/18/2022 16:14    Pending Labs Unresulted Labs (From admission, onward)     Start     Ordered   09/20/22 0500  HIV Antibody (routine testing w rflx)  (HIV  Antibody (Routine testing w reflex) panel)  Tomorrow morning,   R        09/19/22 0027   09/19/22 0500  Basic metabolic panel  Tomorrow morning,   R        09/18/22 2335   09/19/22 0500  CBC  Tomorrow morning,   R        09/18/22 2335   09/19/22 0500  Protime-INR  Tomorrow morning,   R        09/18/22 2335   09/19/22 0500  APTT  Tomorrow morning,   R        09/18/22 2335   09/18/22 1807  Blood Cultures x 2 sites  BLOOD CULTURE X 2,   STAT      09/18/22 1808            Vitals/Pain Today's Vitals   09/18/22 1737 09/18/22 1745 09/18/22 2223 09/19/22 0043  BP: (!) 177/93 (!) 162/95 (!) 173/89 (!) 162/94  Pulse: 81 80 83 82  Resp: 16  16 14   Temp:   98.1 F (36.7 C) 98.2 F (36.8 C)  TempSrc:   Oral Oral  SpO2: 100% 100% 100% 100%  Weight:      Height:      PainSc:   0-No pain 0-No pain    Isolation Precautions No active isolations  Medications Medications  cefTRIAXone (ROCEPHIN) 2 g in sodium chloride 0.9 % 100 mL IVPB (0 g Intravenous Stopped 09/18/22 2247)    And  metroNIDAZOLE (FLAGYL) IVPB 500 mg (0 mg Intravenous Stopped 09/18/22 2338)  insulin aspart (novoLOG) injection 0-15 Units (has no administration in time range)  acetaminophen (TYLENOL) tablet 650 mg (has no administration in time range)    Or  acetaminophen (TYLENOL) suppository 650 mg (has no administration in time range)  amLODipine (NORVASC) tablet 5 mg (has no administration in time range)   empagliflozin (JARDIANCE) tablet 25 mg (has no administration in time range)  rosuvastatin (CRESTOR) tablet 40 mg (has no administration in time range)  sacubitril-valsartan (ENTRESTO) 97-103 mg per tablet (1 tablet Oral Given 09/19/22 0043)  insulin glargine-yfgn (SEMGLEE) injection 10 Units (has no administration in time range)  carvedilol (COREG) tablet 25 mg (25 mg Oral Given 09/19/22 0043)  vancomycin (VANCOREADY) IVPB 2000 mg/400 mL (has no administration in time range)  aspirin EC tablet 81 mg (has no administration in time range)  gadobutrol (GADAVIST) 1 MMOL/ML injection 10 mL (10 mLs Intravenous Contrast Given 09/18/22 2123)    Mobility walks with device     Focused Assessments Cardiac Assessment Handoff:    Lab Results  Component Value Date   TROPONINI <0.03 10/10/2018   No results found for: "DDIMER" Does the Patient currently have chest pain? No    R Recommendations: See Admitting Provider Note  Report given to:   Additional Notes:  Pt using Urinal. Able to answer all questions

## 2022-09-20 ENCOUNTER — Inpatient Hospital Stay (HOSPITAL_COMMUNITY): Payer: Medicare HMO

## 2022-09-20 DIAGNOSIS — E1149 Type 2 diabetes mellitus with other diabetic neurological complication: Secondary | ICD-10-CM | POA: Diagnosis not present

## 2022-09-20 DIAGNOSIS — E1122 Type 2 diabetes mellitus with diabetic chronic kidney disease: Secondary | ICD-10-CM

## 2022-09-20 DIAGNOSIS — L97928 Non-pressure chronic ulcer of unspecified part of left lower leg with other specified severity: Secondary | ICD-10-CM | POA: Diagnosis not present

## 2022-09-20 DIAGNOSIS — Z8673 Personal history of transient ischemic attack (TIA), and cerebral infarction without residual deficits: Secondary | ICD-10-CM

## 2022-09-20 DIAGNOSIS — E1169 Type 2 diabetes mellitus with other specified complication: Secondary | ICD-10-CM | POA: Diagnosis not present

## 2022-09-20 DIAGNOSIS — N179 Acute kidney failure, unspecified: Secondary | ICD-10-CM

## 2022-09-20 DIAGNOSIS — I130 Hypertensive heart and chronic kidney disease with heart failure and stage 1 through stage 4 chronic kidney disease, or unspecified chronic kidney disease: Secondary | ICD-10-CM

## 2022-09-20 DIAGNOSIS — M86172 Other acute osteomyelitis, left ankle and foot: Secondary | ICD-10-CM | POA: Diagnosis not present

## 2022-09-20 DIAGNOSIS — Z794 Long term (current) use of insulin: Secondary | ICD-10-CM

## 2022-09-20 DIAGNOSIS — N1831 Chronic kidney disease, stage 3a: Secondary | ICD-10-CM

## 2022-09-20 DIAGNOSIS — E785 Hyperlipidemia, unspecified: Secondary | ICD-10-CM

## 2022-09-20 DIAGNOSIS — I509 Heart failure, unspecified: Secondary | ICD-10-CM

## 2022-09-20 LAB — BASIC METABOLIC PANEL
Anion gap: 9 (ref 5–15)
BUN: 14 mg/dL (ref 6–20)
CO2: 26 mmol/L (ref 22–32)
Calcium: 8.6 mg/dL — ABNORMAL LOW (ref 8.9–10.3)
Chloride: 102 mmol/L (ref 98–111)
Creatinine, Ser: 1.64 mg/dL — ABNORMAL HIGH (ref 0.61–1.24)
GFR, Estimated: 51 mL/min — ABNORMAL LOW (ref >60)
Glucose, Bld: 107 mg/dL — ABNORMAL HIGH (ref 70–99)
Potassium: 3.7 mmol/L (ref 3.5–5.1)
Sodium: 137 mmol/L (ref 135–145)

## 2022-09-20 LAB — GLUCOSE, CAPILLARY
Glucose-Capillary: 107 mg/dL — ABNORMAL HIGH (ref 70–99)
Glucose-Capillary: 118 mg/dL — ABNORMAL HIGH (ref 70–99)
Glucose-Capillary: 157 mg/dL — ABNORMAL HIGH (ref 70–99)
Glucose-Capillary: 125 mg/dL — ABNORMAL HIGH (ref 70–99)

## 2022-09-20 LAB — CBC
HCT: 28.8 % — ABNORMAL LOW (ref 39.0–52.0)
Hemoglobin: 8.8 g/dL — ABNORMAL LOW (ref 13.0–17.0)
MCH: 21.4 pg — ABNORMAL LOW (ref 26.0–34.0)
MCHC: 30.6 g/dL (ref 30.0–36.0)
MCV: 70.1 fL — ABNORMAL LOW (ref 80.0–100.0)
Platelets: 375 10*3/uL (ref 150–400)
RBC: 4.11 MIL/uL — ABNORMAL LOW (ref 4.22–5.81)
RDW: 14.6 % (ref 11.5–15.5)
WBC: 6.1 10*3/uL (ref 4.0–10.5)
nRBC: 0 % (ref 0.0–0.2)

## 2022-09-20 LAB — CULTURE, BLOOD (ROUTINE X 2): Special Requests: ADEQUATE

## 2022-09-20 MED ORDER — INSULIN GLARGINE-YFGN 100 UNIT/ML ~~LOC~~ SOLN
5.0000 [IU] | Freq: Every day | SUBCUTANEOUS | Status: DC
  Filled 2022-09-20: qty 0.05

## 2022-09-20 MED ORDER — LACTATED RINGERS IV SOLN: INTRAVENOUS | Status: AC

## 2022-09-20 MED ORDER — INSULIN GLARGINE-YFGN 100 UNIT/ML ~~LOC~~ SOLN: 10.0000 [IU] | Freq: Every day | SUBCUTANEOUS | Status: DC

## 2022-09-20 MED ORDER — INSULIN GLARGINE-YFGN 100 UNIT/ML ~~LOC~~ SOLN
10.0000 [IU] | Freq: Every day | SUBCUTANEOUS | Status: DC
  Administered 2022-09-20 – 2022-09-23 (×4): 10 [IU] via SUBCUTANEOUS
  Filled 2022-09-20 (×6): qty 0.1

## 2022-09-20 NOTE — Progress Notes (Signed)
Subjective:  No significant overnight events. Patient has no complaints this morning. He reports experiencing no pain in his left foot. He reports his lower extremity edema has improved. He denies fevers, chills, nausea, vomiting, chest pain, and shortness of breath.  Objective:  Vital signs in last 24 hours: Vitals:   09/19/22 2105 09/20/22 0437 09/20/22 0437 09/20/22 0811  BP: 131/83  (!) 159/82 (!) 159/87  Pulse: 84  83 82  Resp: 15  16 18   Temp: 97.7 F (36.5 C)  97.7 F (36.5 C) 98 F (36.7 C)  TempSrc: Oral   Oral  SpO2: 98%  98% 95%  Weight:  115.7 kg    Height:       Weight change: -6.8 kg  Intake/Output Summary (Last 24 hours) at 09/20/2022 1040 Last data filed at 09/20/2022 1610 Gross per 24 hour  Intake 390 ml  Output 3000 ml  Net -2610 ml   Physical Exam: Constitutional: Middle-aged male lying in bed. Appears comfortable and in no acute distress. Cardiovascular: Regular rate, regular rhythm. No murmurs, rubs, or gallops. Left DP and PT pulses unable to be palpated, but extremities warm and well-perfused. Pulmonary: Normal respiratory effort. No wheezes, rales, rhonchi, or crackles.   Abdominal: Soft. Non-distended. Normal bowel sounds.      MSK: LLE with trace edema around ankle. RLE with no edema. Neurological: Alert and oriented to person, place, and time. Sensation diminished in left foot. Skin: Warm and dry. Wound on lateral left foot near base of fifth toe with serosanguinous drainage, surrounding skin maceration, and foul smell.  Assessment/Plan:  Principal Problem:   Osteomyelitis of fifth toe of left foot (HCC) Active Problems:   HTN (hypertension)   Embolic stroke (HCC)   Type II diabetes mellitus with neurological manifestations (HCC)   Chronic systolic CHF (congestive heart failure) (HCC)   Hyperlipidemia associated with type 2 diabetes mellitus (HCC)   Microcytic anemia  Steven Cochran is a 52 y.o. person living with a history of HFrEF,  stroke, hypertension, type 2 diabetes with neuropathy, hyperlipidemia, microcytic anemia, and left eye blindness who presented with left foot infection and admitted for IV antibiotics and possible operative management on hospital day 2.   # Left fifth metatarsal and proximal phalanx osteomyelitis # Left fifth metatarsophalangeal joint septic arthritis # Diabetic foot infection Patient denies any pain in his left foot. He remains afebrile. No leukocytosis with WBC 6.1 this morning. Left DP and PT pulses unable to be palpated, but extremities warm and well-perfused. ABI ultrasound ordered for today to assess blood flow to left foot. Orthopedics will also see him today to evaluate for any surgical intervention. Will continue IV antibiotics. - Appreciate orthopedic surgery recommendations - IV ceftriaxone, metronidazole, vancomycin - Follow-up ABI - Trend CBC  # Diabetes, type II with neurologic manifestations  Blood glucose well controlled with 24-hour range of 107-130. Will continue Semglee 10 units daily, SSI, and Jardiance 25 mg daily. - Semglee 10 units daily - SSI - Jardiance 25 mg daily  # CHF (EF 45-50%)  # Hypertension Patient with no signs of volume overload on exam. Blood pressure elevated this morning with MAPs in the 90-100s. Will continue antihypertensives and titrate up as needed. - Amlodipine 5 mg daily - Carvedilol 25 mg twice daily - Entresto 97-103 mg twice daily - Daily weights - Strict I's and O's  # AKI on CKD stage 3a  Baseline creatinine around 1.3. Creatinine elevated at 1.64 this morning, which is consistent with an  AKI. Suspect likely secondary to decreased PO fluid intake. Will start LR 75 mL/hr for 8 hours and encourage increased PO fluid intake. - LR 75 ml/hr x 8 hr - Trend BMP   # Previous CVA with no residual deficits # Hyperlipidemia Holding aspirin given potential for upcoming surgery. Will continue Crestor 40 mg daily - Hold aspirin - Crestor 40  mg  Diet: HH/CM Bowel: None VTE: SCDs IVF: 75 mL/hr x 8 hr Code: Full LOS: day 2   Prior to Admission Living Arrangement: Home, living alone Anticipated Discharge Location: Home Barriers to Discharge: Continued management  Whitman Hero, Medical Student 09/20/2022, 10:40 AM

## 2022-09-20 NOTE — Progress Notes (Signed)
ABI has been completed.   Results can be found under chart review under CV PROC. 09/20/2022 6:08 PM Mouhamed Glassco RVT, RDMS

## 2022-09-20 NOTE — TOC Initial Note (Signed)
Transition of Care [facility]) - Initial/Assessment Note    Patient Details  Name: Steven Cochran MRN: [id number] Date of Birth: Jun 10, 1970  Transition [facility]) CM/SW Contact:    Epifanio Lesches, RN Phone Number: 09/20/2022, 1:17 PM  Clinical Narrative:                   - Osteomyelitis of fifth toe of left foot   From home alone. Divorcee. PTA  independent with ADL's. Pt uses a walking stick with ambulation. Works part-time @ [employer]. States 53 y/o son and daughter (college age) will assist with care if needed once d/c.  Plan: Orthopedic consult pending...  TOC team following for needs.  Expected Discharge Plan: Rest Home (works part-time @ [employer]) Barriers to Discharge: Continued Medical Work up   Patient Goals and CMS Choice            Expected Discharge Plan and Services   Discharge Planning Services: CM Consult   Living arrangements for the past 2 months: Apartment (resides on the 3rd floor)                                      Prior Living Arrangements/Services Living arrangements for the past 2 months: Apartment (resides on the 3rd floor) Lives with:: Self Patient language and need for interpreter reviewed:: Yes Do you feel safe going back to the place where you live?: Yes      Need for Family Participation in Patient Care: Yes (Comment) Care giver support system in place?: Yes (comment) (daughter and 43 y/o son)   Criminal Activity/Legal Involvement Pertinent to Current Situation/Hospitalization: No - Comment as needed  Activities of Daily Living      Permission Sought/Granted   Permission granted to share information with : Yes, Verbal Permission Granted  Share Information with NAME: Elyas Aronhalt  Daughter  [phone] ( daughter in college in [place]           Emotional Assessment Appearance:: Appears stated age Attitude/Demeanor/Rapport: Engaged Affect (typically observed): Accepting Orientation: : Oriented to Self,  Oriented to Place, Oriented to  Time, Oriented to Situation Alcohol / Substance Use: Not Applicable Psych Involvement: No (comment)  Admission diagnosis:  Osteomyelitis of left foot, unspecified type (HCC) [M86.9] Osteomyelitis of fifth toe of left foot (HCC) [M86.9] Patient Active Problem List   Diagnosis Date Noted   Osteomyelitis of fifth toe of left foot (HCC) 09/18/2022   Primary open angle glaucoma 08/29/2022   Preventive measure 07/15/2022   Hyperlipidemia associated with type 2 diabetes mellitus (HCC) 07/15/2022   Microcytic anemia 07/15/2022   Diabetic ulcer of left foot associated with type 2 diabetes mellitus (HCC) 06/14/2022   Embolic stroke (HCC) 09/02/2021   Type II diabetes mellitus with neurological manifestations (HCC) 09/02/2021   Chronic systolic CHF (congestive heart failure) (HCC) 09/02/2021   HTN (hypertension) 06/30/2012   PCP:  Gwenevere Abbot, MD Pharmacy:   [facility] [address] [place] - [address] [place] [address] Phone: [phone] Fax: [phone]  [facility] [address], [place] - [address] [place] [address] Phone: [phone] Fax: [phone]  [facility] [address] [place] [address] Phone: [phone] Fax: [phone]  [facility] [address] [place] [address] Phone: [phone]  Fax: [phone]  [facility] [address] [place] [address] Phone: [phone] Fax: [phone]     Social Determinants of Health (SDOH) Social History: SDOH Screenings   Food Insecurity: No Food Insecurity (07/12/2022)  Housing: Low Risk  (07/12/2022)  Transportation Needs: Unmet Transportation Needs (07/12/2022)  Utilities: Not At Risk (07/12/2022)  Alcohol Screen: Low Risk  (07/12/2022)  Depression  (PHQ2-9): Low Risk  (07/12/2022)  Financial Resource Strain: Low Risk  (07/12/2022)  Physical Activity: Sufficiently Active (07/12/2022)  Social Connections: Socially Isolated (07/12/2022)  Stress: No Stress Concern Present (07/12/2022)  Tobacco Use: Low Risk  (09/18/2022)   SDOH Interventions:     Readmission Risk Interventions     No data to display

## 2022-09-20 NOTE — Plan of Care (Signed)
  Problem: Education: Goal: Knowledge of General Education information will improve Description: Including pain rating scale, medication(s)/side effects and non-pharmacologic comfort measures Outcome: Progressing   Problem: Health Behavior/Discharge Planning: Goal: Ability to manage health-related needs will improve Outcome: Progressing   Problem: Nutrition: Goal: Adequate nutrition will be maintained Outcome: Progressing   Problem: Elimination: Goal: Will not experience complications related to bowel motility Outcome: Progressing   Problem: Safety: Goal: Ability to remain free from injury will improve Outcome: Progressing   Problem: Skin Integrity: Goal: Risk for impaired skin integrity will decrease Outcome: Progressing   

## 2022-09-21 ENCOUNTER — Ambulatory Visit: Payer: Medicare HMO | Admitting: Podiatry

## 2022-09-21 DIAGNOSIS — N1831 Chronic kidney disease, stage 3a: Secondary | ICD-10-CM

## 2022-09-21 DIAGNOSIS — I509 Heart failure, unspecified: Secondary | ICD-10-CM

## 2022-09-21 DIAGNOSIS — E1169 Type 2 diabetes mellitus with other specified complication: Secondary | ICD-10-CM | POA: Diagnosis not present

## 2022-09-21 DIAGNOSIS — M869 Osteomyelitis, unspecified: Secondary | ICD-10-CM | POA: Diagnosis not present

## 2022-09-21 DIAGNOSIS — E1149 Type 2 diabetes mellitus with other diabetic neurological complication: Secondary | ICD-10-CM | POA: Diagnosis not present

## 2022-09-21 DIAGNOSIS — M86172 Other acute osteomyelitis, left ankle and foot: Secondary | ICD-10-CM | POA: Diagnosis not present

## 2022-09-21 DIAGNOSIS — M00872 Arthritis due to other bacteria, left ankle and foot: Secondary | ICD-10-CM | POA: Diagnosis not present

## 2022-09-21 DIAGNOSIS — E1122 Type 2 diabetes mellitus with diabetic chronic kidney disease: Secondary | ICD-10-CM

## 2022-09-21 DIAGNOSIS — I130 Hypertensive heart and chronic kidney disease with heart failure and stage 1 through stage 4 chronic kidney disease, or unspecified chronic kidney disease: Secondary | ICD-10-CM

## 2022-09-21 DIAGNOSIS — N179 Acute kidney failure, unspecified: Secondary | ICD-10-CM

## 2022-09-21 LAB — GLUCOSE, CAPILLARY
Glucose-Capillary: 119 mg/dL — ABNORMAL HIGH (ref 70–99)
Glucose-Capillary: 131 mg/dL — ABNORMAL HIGH (ref 70–99)
Glucose-Capillary: 98 mg/dL (ref 70–99)
Glucose-Capillary: 144 mg/dL — ABNORMAL HIGH (ref 70–99)

## 2022-09-21 LAB — CBC
HCT: 27.7 % — ABNORMAL LOW (ref 39.0–52.0)
Hemoglobin: 8.5 g/dL — ABNORMAL LOW (ref 13.0–17.0)
MCH: 21.6 pg — ABNORMAL LOW (ref 26.0–34.0)
MCHC: 30.7 g/dL (ref 30.0–36.0)
MCV: 70.3 fL — ABNORMAL LOW (ref 80.0–100.0)
Platelets: 368 10*3/uL (ref 150–400)
RBC: 3.94 MIL/uL — ABNORMAL LOW (ref 4.22–5.81)
RDW: 14.4 % (ref 11.5–15.5)
WBC: 6.7 10*3/uL (ref 4.0–10.5)
nRBC: 0 % (ref 0.0–0.2)

## 2022-09-21 LAB — BASIC METABOLIC PANEL
Anion gap: 6 (ref 5–15)
BUN: 19 mg/dL (ref 6–20)
CO2: 27 mmol/L (ref 22–32)
Calcium: 8.6 mg/dL — ABNORMAL LOW (ref 8.9–10.3)
Chloride: 104 mmol/L (ref 98–111)
Creatinine, Ser: 1.61 mg/dL — ABNORMAL HIGH (ref 0.61–1.24)
GFR, Estimated: 52 mL/min — ABNORMAL LOW (ref >60)
Glucose, Bld: 119 mg/dL — ABNORMAL HIGH (ref 70–99)
Potassium: 3.7 mmol/L (ref 3.5–5.1)
Sodium: 137 mmol/L (ref 135–145)

## 2022-09-21 LAB — SURGICAL PCR SCREEN
MRSA, PCR: NEGATIVE
Staphylococcus aureus: NEGATIVE

## 2022-09-21 LAB — MISC LABCORP TEST (SEND OUT): Labcorp test code: 83935

## 2022-09-21 LAB — CULTURE, BLOOD (ROUTINE X 2): Culture: NO GROWTH

## 2022-09-21 MED ORDER — VANCOMYCIN HCL 750 MG/150ML IV SOLN
750.0000 mg | Freq: Two times a day (BID) | INTRAVENOUS | Status: DC
  Administered 2022-09-21 – 2022-09-26 (×10): 750 mg via INTRAVENOUS
  Filled 2022-09-21 (×12): qty 150

## 2022-09-21 NOTE — Progress Notes (Signed)
Subjective:   Summary: Steven Cochran is a 52 y.o. year old male currently admitted on the IMTS HD#2 for osteomyelitis .  Overnight Events: NPO @ [facility] for possible OR today. Mildly hypertensive, afebrile.  No cp or sob, no fevers or chills overnight says his feet are not hurting. Has not been evaluated by ortho yet, will reach out to their service again. Discussed plan with patient, understands and amenable  Objective:  Vital signs in last 24 hours: Vitals:   09/20/22 1336 09/20/22 2155 09/21/22 0641 09/21/22 0702  BP: (!) 152/83 (!) 141/98 (!) 148/89   Pulse: 84 80 87   Resp: 18 16 16    Temp: 98.1 F (36.7 C) (!) 97.5 F (36.4 C) 98.6 F (37 C)   TempSrc:  Oral Oral   SpO2: 99% 100% 97%   Weight:    115.8 kg  Height:       Supplemental O2: Room Air SpO2: 97 %   Physical Exam:  Constitutional: Middle-aged male lying in bed. Appears comfortable and in no acute distress. Cardiovascular: Regular rate, regular rhythm. No murmurs, rubs, or gallops. Left DP and PT pulses palpable, extremities warm and well-perfused. Pulmonary: Normal respiratory effort. No wheezes, rales, rhonchi, or crackles.   Abdominal: Soft. Non-distended. Normal bowel sounds.      MSK: LLE with trace edema around ankle. RLE with no edema. Neurological: Sensation diminished in left foot. Skin: Warm and dry. Wound on lateral left foot near base of fifth toe with serosanguinous drainage, surrounding skin maceration, and foul smell.  Filed Weights   09/18/22 1343 09/20/22 0437 09/21/22 0702  Weight: 122.5 kg 115.7 kg 115.8 kg     Intake/Output Summary (Last 24 hours) at 09/21/2022 0941 Last data filed at 09/21/2022 0544 Gross per 24 hour  Intake 1616 ml  Output 1375 ml  Net 241 ml   Net IO Since Admission: -2,169 mL [09/21/22 0941]  Pertinent Labs:    Latest Ref Rng & Units 09/21/2022    7:53 AM 09/20/2022    6:18 AM 09/19/2022    6:45 AM  CBC  WBC 4.0 - 10.5 K/uL 6.7  6.1  6.7    Hemoglobin 13.0 - 17.0 g/dL 8.5  8.8  8.7   Hematocrit 39.0 - 52.0 % 27.7  28.8  29.1   Platelets 150 - 400 K/uL 368  375  320        Latest Ref Rng & Units 09/20/2022    6:18 AM 09/19/2022    6:45 AM 09/18/2022    1:50 PM  CMP  Glucose 70 - 99 mg/dL 161  096  045   BUN 6 - 20 mg/dL 14  17  20    Creatinine 0.61 - 1.24 mg/dL 4.09  8.11  9.14   Sodium 135 - 145 mmol/L 137  137  136   Potassium 3.5 - 5.1 mmol/L 3.7  3.9  4.5   Chloride 98 - 111 mmol/L 102  104  103   CO2 22 - 32 mmol/L 26  23  27    Calcium 8.9 - 10.3 mg/dL 8.6  8.4  8.7      Imaging: VAS [place] ABI WITH/WO TBI  Result Date: 09/20/2022  LOWER EXTREMITY DOPPLER STUDY Patient Name:  Steven Cochran  Date of Exam:   09/20/2022 Medical Rec #: [id number]      Accession #:    [id number] Date of Birth: 09-Jan-1971  Patient Gender: M Patient Age:   8 years Exam Location:  [facility] Procedure:      VAS [place] ABI WITH/WO TBI Referring Phys: CAROLYN GUILLOUD --------------------------------------------------------------------------------  Indications: Ulceration (LLE). High Risk Factors: Hypertension, hyperlipidemia, Diabetes, no history of                    smoking, prior CVA. Other Factors: CHF.  Comparison Study: No previous exams Performing Technologist: Hill, Jody RVT, RDMS  Examination Guidelines: A complete evaluation includes at minimum, Doppler waveform signals and systolic blood pressure reading at the level of bilateral brachial, anterior tibial, and posterior tibial arteries, when vessel segments are accessible. Bilateral testing is considered an integral part of a complete examination. Photoelectric Plethysmograph (PPG) waveforms and toe systolic pressure readings are included as required and additional duplex testing as needed. Limited examinations for reoccurring indications may be performed as noted.  ABI Findings: +---------+------------------+-----+---------+--------+ Right    Rt Pressure (mmHg)IndexWaveform  Comment  +---------+------------------+-----+---------+--------+ Brachial 180                    triphasic         +---------+------------------+-----+---------+--------+ PTA      240               1.24 triphasic         +---------+------------------+-----+---------+--------+ DP       228               1.18 triphasic         +---------+------------------+-----+---------+--------+ Great Toe                       Normal            +---------+------------------+-----+---------+--------+ +---------+------------------+-----+---------+-------+ Left     Lt Pressure (mmHg)IndexWaveform Comment +---------+------------------+-----+---------+-------+ Brachial 194                    triphasic        +---------+------------------+-----+---------+-------+ PTA      220               1.13 triphasic        +---------+------------------+-----+---------+-------+ DP       206               1.06 biphasic         +---------+------------------+-----+---------+-------+ Great Toe                       Normal           +---------+------------------+-----+---------+-------+ Unable to obtain TBI due to size / bandage.  Summary: Right: Resting right ankle-brachial index is within normal range. Left: Resting left ankle-brachial index is within normal range. *See table(s) above for measurements and observations.     Preliminary      EKG:   Assessment/Plan:   Principal Problem:   Osteomyelitis of fifth toe of left foot (HCC) Active Problems:   HTN (hypertension)   Embolic stroke (HCC)   Type II diabetes mellitus with neurological manifestations (HCC)   Chronic systolic CHF (congestive heart failure) (HCC)   Hyperlipidemia associated with type 2 diabetes mellitus (HCC)   Microcytic anemia   Patient Summary: Steven Cochran is a 52 y.o. person living with a history of HFrEF, stroke, hypertension, type 2 diabetes with neuropathy, hyperlipidemia, microcytic anemia, and left eye  blindness who presented with left foot infection and admitted for osteomyelitis.    # Left fifth  metatarsal and proximal phalanx osteomyelitis # Left fifth metatarsophalangeal joint septic arthritis # Diabetic foot infection . He remains afebrile. No leukocytosis. ABI wnl and good perfusion on exam.  Initially NPO for possible intervention today, but communicated with orthopedics and they will not be taking him into the OR today.  Will resume diet.  Orthopedics to evaluate today and then plan interventions.  Will continue IV antibiotics. - Appreciate orthopedic surgery recommendations - IV ceftriaxone, metronidazole, vancomycin - Trend CBC   # Diabetes, type II with neurologic manifestations  CBGs well-controlled. Will continue Semglee 10 units daily, SSI, and Jardiance 25 mg daily. - Semglee 10 units daily - SSI - Jardiance 25 mg daily   # CHF (EF 45-50%)  # Hypertension Mildly hypertensive, euvolemic. Will continue antihypertensives and titrate up as needed. - Amlodipine 5 mg daily - Carvedilol 25 mg twice daily - Entresto 97-103 mg twice daily - Daily weights - Strict I's and O's   # AKI on CKD stage 3a  Baseline creatinine around 1.3.  Likely prerenal AKI yesterday in the setting of poor p.o. intake.  Diet has been resumed.  He also got light fluids yesterday.  Labs pending today, will follow-up and manage as appropriate. -Light IV fluid maintenance as needed - Trend BMP    # Previous CVA with no residual deficits # Hyperlipidemia Holding aspirin given potential for upcoming surgery. Will continue Crestor 40 mg daily - Hold aspirin - Crestor 40 mg  Diet:  Heart healthy, carb modified IVF: None, VTE:  Holding for possible procedure Code: Full PT/OT recs: Pending, . TOC recs: Pending Family Update:   Dispo: Anticipated discharge pending possible operative management of osteomyelitis and septic arthritis and subsequent PT/OT evaluation  Lyndle Herrlich, MD PGY-1  Internal Medicine Resident Please contact the on call pager after 5 pm and on weekends at [phone].

## 2022-09-21 NOTE — Progress Notes (Addendum)
Pharmacy Antibiotic Note  Steven Cochran is a 52 y.o. male admitted on 09/18/2022 with  osteomyelitis .  Pharmacy has been consulted for vancomycin dosing.  Scr baseline seems to vary - appears to be between 1.4-1.5. Good UOP - 1750mL/24h.  Plan: Adjust vancomycin to 750mg  IV q12h (eAUC 451, Scr 1.6, Vd 0.5) Continue ceftriaxone and Flagyl per MD F/u surgical plans Monitor renal function closely for futher dose adjustments  Height: 6' 2.5" (189.2 cm) Weight: 115.8 kg (255 lb 4.7 oz) IBW/kg (Calculated) : 83.35  Temp (24hrs), Avg:98.1 F (36.7 C), Min:97.5 F (36.4 C), Max:98.6 F (37 C)  Recent Labs  Lab 09/18/22 1350 09/18/22 1957 09/19/22 0645 09/20/22 0618 09/21/22 0753  WBC 8.3  --  6.7 6.1 6.7  CREATININE 1.58*  --  1.38* 1.64* 1.61*  LATICACIDVEN  --  0.9  --   --   --      Estimated Creatinine Clearance: 74.8 mL/min (A) (by C-G formula based on SCr of 1.61 mg/dL (H)).    Allergies  Allergen Reactions   Metformin And Related Diarrhea    Antimicrobials this admission:  Vancomycin 5/15 >>  Ceftriaxone 5/14 >>  Flagyl 5/14 >>  Dose adjustments this admission:  5/17:  Adj vanc 1000mg  q12h >> 750mg  q12 for new [facility]  Microbiology results:  5/14 BCx: ngtd  Thank you for allowing pharmacy to be a part of this patient's care.  Rexford Maus, PharmD, BCPS 09/21/2022 9:48 AM

## 2022-09-21 NOTE — Care Management Important Message (Signed)
Important Message  Patient Details  Name: Lavon Richards MRN: [id number] Date of Birth: 09/15/1970   Medicare Important Message Given:  Yes     Sherilyn Banker 09/21/2022, 3:58 PM

## 2022-09-21 NOTE — Plan of Care (Signed)
  Problem: Activity: Goal: Risk for activity intolerance will decrease 09/21/2022 1826 by Vernetta Honey, RN Outcome: Progressing 09/21/2022 1826 by Vernetta Honey, RN Outcome: Progressing   Problem: Nutrition: Goal: Adequate nutrition will be maintained 09/21/2022 1826 by Vernetta Honey, RN Outcome: Progressing 09/21/2022 1826 by Vernetta Honey, RN Outcome: Progressing   Problem: Elimination: Goal: Will not experience complications related to bowel motility Outcome: Progressing   Problem: Pain Managment: Goal: General experience of comfort will improve 09/21/2022 1826 by Vernetta Honey, RN Outcome: Progressing 09/21/2022 1826 by Vernetta Honey, RN Outcome: Progressing

## 2022-09-21 NOTE — Consult Note (Signed)
ORTHOPAEDIC CONSULTATION  REQUESTING PHYSICIAN: Steven Smart, MD  Chief Complaint: Ulceration fifth metatarsal head left foot.  HPI: Steven Cochran is a 52 y.o. male who presents with diabetic insensate neuropathy with a chronic ulcer beneath the fifth metatarsal head left foot.  Past Medical History:  Diagnosis Date   Cataract    CHF (congestive heart failure) (HCC)    Diabetes mellitus    Hypertension    Past Surgical History:  Procedure Laterality Date   CATARACT EXTRACTION Bilateral    SKIN GRAFT     feet   Social History   Socioeconomic History   Marital status: Single    Spouse name: Not on file   Number of children: Not on file   Years of education: Not on file   Highest education level: Not on file  Occupational History   Not on file  Tobacco Use   Smoking status: Never   Smokeless tobacco: Never  Vaping Use   Vaping Use: Never used  Substance and Sexual Activity   Alcohol use: No   Drug use: No   Sexual activity: Not Currently    Partners: Female    Birth control/protection: None  Other Topics Concern   Not on file  Social History Narrative   Not on file   Social Determinants of Health   Financial Resource Strain: Low Risk  (07/12/2022)   Overall Financial Resource Strain (CARDIA)    Difficulty of Paying Living Expenses: Not hard at all  Food Insecurity: No Food Insecurity (07/12/2022)   Hunger Vital Sign    Worried About Running Out of Food in the Last Year: Never true    Ran Out of Food in the Last Year: Never true  Transportation Needs: Unmet Transportation Needs (07/12/2022)   PRAPARE - Transportation    Lack of Transportation (Medical): Yes    Lack of Transportation (Non-Medical): Yes  Physical Activity: Sufficiently Active (07/12/2022)   Exercise Vital Sign    Days of Exercise per Week: 5 days    Minutes of Exercise per Session: 40 min  Stress: No Stress Concern Present (07/12/2022)   [facility] [employer] -  Occupational Stress Questionnaire    Feeling of Stress : Not at all  Social Connections: Socially Isolated (07/12/2022)   Social Connection and Isolation Panel [NHANES]    Frequency of Communication with Friends and Family: More than three times a week    Frequency of Social Gatherings with Friends and Family: Never    Attends Religious Services: Never    Database administrator or Organizations: No    Attends Engineer, structural: Never    Marital Status: Divorced   Family History  Problem Relation Age of Onset   Colon cancer Neg Hx    Esophageal cancer Neg Hx    Stomach cancer Neg Hx    Rectal cancer Neg Hx    - negative except otherwise stated in the family history section Allergies  Allergen Reactions   Metformin And Related Diarrhea   Prior to Admission medications   Medication Sig Start Date End Date Taking? Authorizing Provider  amLODipine (NORVASC) 5 MG tablet Take 1 tablet (5 mg total) by mouth daily. 08/29/22  Yes Gwenevere Abbot, MD  aspirin EC 81 MG EC tablet Take 1 tablet (81 mg total) by mouth daily. Swallow whole. 09/04/21  Yes Leroy Sea, MD  carvedilol (COREG) 25 MG tablet Take 1 tablet (25 mg total) by mouth 2 (two) times daily.  07/12/22 11/09/22 Yes Gwenevere Abbot, MD  empagliflozin (JARDIANCE) 25 MG TABS tablet Take 1 tablet (25 mg total) by mouth daily. 07/12/22  Yes Gwenevere Abbot, MD  insulin glargine (LANTUS) 100 UNIT/ML Solostar Pen Inject 20 Units into the skin daily. 07/12/22  Yes Gwenevere Abbot, MD  rosuvastatin (CRESTOR) 40 MG tablet Take 1 tablet (40 mg total) by mouth daily. 07/12/22  Yes Gwenevere Abbot, MD  sacubitril-valsartan (ENTRESTO) 97-103 MG Take 1 tablet by mouth 2 (two) times daily. 08/30/22 08/30/23 Yes Gwenevere Abbot, MD  Semaglutide,0.25 or 0.5MG /DOS, 2 MG/3ML SOPN Inject 0.5 mg into the skin once a week. 08/29/22 09/28/22 Yes Gwenevere Abbot, MD  Blood Glucose Monitoring Suppl (FREESTYLE LITE) w/Device KIT Use as directed 06/15/22   Lanae Boast, MD  Continuous  Glucose Sensor (FREESTYLE LIBRE 3 SENSOR) MISC 1 Device by Does not apply route every 14 (fourteen) days. Place 1 sensor on the skin every 14 days. Use to check glucose continuously 09/19/22 12/18/22  Quincy Simmonds, MD  glucose blood (FREESTYLE LITE) test strip Monitor blood sugar at home 06/15/22   Lanae Boast, MD  Insulin Pen Needle 32G X 4 MM MISC Use with Basaglar. 07/12/22   Gwenevere Abbot, MD  Lancets (FREESTYLE) lancets Use a directed 06/15/22   Lanae Boast, MD   VAS [place] ABI WITH/WO TBI  Result Date: 09/21/2022  LOWER EXTREMITY DOPPLER STUDY Patient Name:  Steven Cochran  Date of Exam:   09/20/2022 Medical Rec #: [id number]      Accession #:    [id number] Date of Birth: Aug 26, 1970      Patient Gender: M Patient Age:   52 years Exam Location:  [facility] Procedure:      VAS [place] ABI WITH/WO TBI Referring Phys: Steven Cochran --------------------------------------------------------------------------------  Indications: Ulceration (LLE). High Risk Factors: Hypertension, hyperlipidemia, Diabetes, no history of                    smoking, prior CVA. Other Factors: CHF.  Comparison Study: No previous exams Performing Technologist: Cochran, Steven RVT, RDMS  Examination Guidelines: A complete evaluation includes at minimum, Doppler waveform signals and systolic blood pressure reading at the level of bilateral brachial, anterior tibial, and posterior tibial arteries, when vessel segments are accessible. Bilateral testing is considered an integral part of a complete examination. Photoelectric Plethysmograph (PPG) waveforms and toe systolic pressure readings are included as required and additional duplex testing as needed. Limited examinations for reoccurring indications may be performed as noted.  ABI Findings: +---------+------------------+-----+---------+--------+ Right    Rt Pressure (mmHg)IndexWaveform Comment  +---------+------------------+-----+---------+--------+ Brachial 180                    triphasic          +---------+------------------+-----+---------+--------+ PTA      240               1.24 triphasic         +---------+------------------+-----+---------+--------+ DP       228               1.18 triphasic         +---------+------------------+-----+---------+--------+ Great Toe                       Normal            +---------+------------------+-----+---------+--------+ +---------+------------------+-----+---------+-------+ Left     Lt Pressure (mmHg)IndexWaveform Comment +---------+------------------+-----+---------+-------+ Brachial 194  triphasic        +---------+------------------+-----+---------+-------+ PTA      220               1.13 triphasic        +---------+------------------+-----+---------+-------+ DP       206               1.06 biphasic         +---------+------------------+-----+---------+-------+ Great Toe                       Normal           +---------+------------------+-----+---------+-------+ Unable to obtain TBI due to size / bandage.  Summary: Right: Resting right ankle-brachial index is within normal range. Left: Resting left ankle-brachial index is within normal range. *See table(s) above for measurements and observations.  Electronically signed by Gerarda Fraction on 09/21/2022 at 2:53:05 PM.    Final    - pertinent xrays, CT, MRI studies were reviewed and independently interpreted  Positive ROS: All other systems have been reviewed and were otherwise negative with the exception of those mentioned in the HPI and as above.  Physical Exam: General: Alert, no acute distress Psychiatric: Patient is competent for consent with normal mood and affect Lymphatic: No axillary or cervical lymphadenopathy Cardiovascular: No pedal edema Respiratory: No cyanosis, no use of accessory musculature GI: No organomegaly, abdomen is soft and non-tender    Images:  @ENCIMAGES @  Labs:  Lab Results  Component Value Date    HGBA1C 8.5 (A) 08/29/2022   HGBA1C 11.7 (H) 06/14/2022   HGBA1C 10.9 (H) 09/02/2021   ESRSEDRATE 47 (H) 06/15/2022   ESRSEDRATE 46 (H) 06/13/2022   CRP <0.5 06/15/2022   REPTSTATUS PENDING 09/18/2022   CULT  09/18/2022    NO GROWTH 3 DAYS Performed at [facility], [address], [place], [place] [address]     Lab Results  Component Value Date   ALBUMIN 2.9 (L) 06/15/2022   ALBUMIN 2.8 (L) 09/03/2021   ALBUMIN 3.2 (L) 09/02/2021        Latest Ref Rng & Units 09/21/2022    7:53 AM 09/20/2022    6:18 AM 09/19/2022    6:45 AM  CBC EXTENDED  WBC 4.0 - 10.5 K/uL 6.7  6.1  6.7   RBC 4.22 - 5.81 MIL/uL 3.94  4.11  4.03   Hemoglobin 13.0 - 17.0 g/dL 8.5  8.8  8.7   HCT 60.4 - 52.0 % 27.7  28.8  29.1   Platelets 150 - 400 K/uL 368  375  320     Neurologic: Patient does not have protective sensation bilateral lower extremities.   MUSCULOSKELETAL:   Skin: Examination there is a necrotic ulcer on the plantar aspect fifth metatarsal head left foot that probes to bone.  There is no purulent drainage.  Patient has a palpable dorsalis pedis pulse.  Ankle-brachial indices shows multiphasic flow with normal ABI.  Hemoglobin A1c 8.5.  Hemoglobin 8.5.  MRI scan of the left foot shows osteomyelitis of the MTP joint left foot.  Assessment: Assessment: Diabetic insensate neuropathy with osteomyelitis left foot MTP joint.  Plan: Plan: Will plan for a left foot fifth ray amputation.  Anticipate surgery for Wednesday.  Continue IV antibiotics.  Thank you for the consult and the opportunity to see Steven Cochran  Aldean Baker, MD [facility] [phone] 4:31 PM

## 2022-09-21 NOTE — Plan of Care (Signed)
?  Problem: Education: ?Goal: Knowledge of General Education information will improve ?Description: Including pain rating scale, medication(s)/side effects and non-pharmacologic comfort measures ?Outcome: Progressing ?  ?Problem: Health Behavior/Discharge Planning: ?Goal: Ability to manage health-related needs will improve ?Outcome: Progressing ?  ?Problem: Activity: ?Goal: Risk for activity intolerance will decrease ?Outcome: Progressing ?  ?Problem: Nutrition: ?Goal: Adequate nutrition will be maintained ?Outcome: Progressing ?  ?Problem: Skin Integrity: ?Goal: Risk for impaired skin integrity will decrease ?Outcome: Progressing ?  ?

## 2022-09-22 LAB — BASIC METABOLIC PANEL
Anion gap: 9 (ref 5–15)
BUN: 21 mg/dL — ABNORMAL HIGH (ref 6–20)
CO2: 25 mmol/L (ref 22–32)
Calcium: 8.6 mg/dL — ABNORMAL LOW (ref 8.9–10.3)
Chloride: 104 mmol/L (ref 98–111)
Creatinine, Ser: 1.63 mg/dL — ABNORMAL HIGH (ref 0.61–1.24)
GFR, Estimated: 51 mL/min — ABNORMAL LOW (ref >60)
Glucose, Bld: 141 mg/dL — ABNORMAL HIGH (ref 70–99)
Potassium: 3.9 mmol/L (ref 3.5–5.1)
Sodium: 138 mmol/L (ref 135–145)

## 2022-09-22 LAB — CULTURE, BLOOD (ROUTINE X 2)

## 2022-09-22 LAB — GLUCOSE, CAPILLARY
Glucose-Capillary: 133 mg/dL — ABNORMAL HIGH (ref 70–99)
Glucose-Capillary: 127 mg/dL — ABNORMAL HIGH (ref 70–99)
Glucose-Capillary: 110 mg/dL — ABNORMAL HIGH (ref 70–99)
Glucose-Capillary: 186 mg/dL — ABNORMAL HIGH (ref 70–99)

## 2022-09-22 MED ORDER — ENOXAPARIN SODIUM 40 MG/0.4ML IJ SOSY
40.0000 mg | PREFILLED_SYRINGE | INTRAMUSCULAR | Status: DC
  Administered 2022-09-22 – 2022-09-24 (×3): 40 mg via SUBCUTANEOUS
  Filled 2022-09-22 (×3): qty 0.4

## 2022-09-22 MED ORDER — LACTATED RINGERS IV SOLN: INTRAVENOUS | Status: AC

## 2022-09-22 MED ORDER — ASPIRIN 81 MG PO TBEC
81.0000 mg | DELAYED_RELEASE_TABLET | Freq: Every day | ORAL | Status: DC
  Administered 2022-09-22: 81 mg via ORAL
  Filled 2022-09-22: qty 1

## 2022-09-22 NOTE — Progress Notes (Signed)
Subjective:   Summary: Steven Cochran is a 52 y.o. year old male currently admitted on the IMTS HD#3 for osteomyelitis .  Overnight Events: Evaluated by orthopedics yesterday, plan for surgery next Wednesday 5/22.  NAEON.  Mildly hypertensive.  Concerned about amputation. Reports that he was told surgery was for Monday.  No other acute concerns.  Denies any chest pain or dyspnea, lower extremity edema or pain in his left foot.   Objective:  Vital signs in last 24 hours: Vitals:   09/21/22 1500 09/21/22 2039 09/22/22 0404 09/22/22 0457  BP: 134/69 132/76 (!) 147/89   Pulse: 77 80 82   Resp: 17 20 20    Temp: 98.3 F (36.8 C) 98.1 F (36.7 C) 98.2 F (36.8 C)   TempSrc: Oral Oral Oral   SpO2:  99% 99%   Weight:    116 kg  Height:       Supplemental O2: Room Air SpO2: 99 %   Physical Exam:  Constitutional: Middle-aged male lying in bed. Appears comfortable and in no acute distress. Cardiovascular: Regular rate, regular rhythm. No murmurs, rubs, or gallops. Left DP and PT pulses palpable, extremities warm and well-perfused. Pulmonary: Normal respiratory effort. No wheezes, rales, rhonchi, or crackles.   Abdominal: Soft. Non-distended. Normal bowel sounds.      MSK: LLE with trace edema around ankle. RLE with no edema. Neurological: Sensation diminished in left foot. Skin: Warm and dry. Wound on lateral left foot near base of fifth toe covered with dressings, clean dry intact.  Filed Weights   09/20/22 0437 09/21/22 0702 09/22/22 0457  Weight: 115.7 kg 115.8 kg 116 kg     Intake/Output Summary (Last 24 hours) at 09/22/2022 0728 Last data filed at 09/22/2022 0535 Gross per 24 hour  Intake 1340 ml  Output 905 ml  Net 435 ml    Net IO Since Admission: -1,734 mL [09/22/22 0728]  Pertinent Labs:    Latest Ref Rng & Units 09/21/2022    7:53 AM 09/20/2022    6:18 AM 09/19/2022    6:45 AM  CBC  WBC 4.0 - 10.5 K/uL 6.7  6.1  6.7   Hemoglobin 13.0 - 17.0  g/dL 8.5  8.8  8.7   Hematocrit 39.0 - 52.0 % 27.7  28.8  29.1   Platelets 150 - 400 K/uL 368  375  320        Latest Ref Rng & Units 09/22/2022    1:56 AM 09/21/2022    7:53 AM 09/20/2022    6:18 AM  CMP  Glucose 70 - 99 mg/dL 161  096  045   BUN 6 - 20 mg/dL 21  19  14    Creatinine 0.61 - 1.24 mg/dL 4.09  8.11  9.14   Sodium 135 - 145 mmol/L 138  137  137   Potassium 3.5 - 5.1 mmol/L 3.9  3.7  3.7   Chloride 98 - 111 mmol/L 104  104  102   CO2 22 - 32 mmol/L 25  27  26    Calcium 8.9 - 10.3 mg/dL 8.6  8.6  8.6      Imaging: No results found.   EKG:   Assessment/Plan:   Principal Problem:   Osteomyelitis of fifth toe of left foot (HCC) Active Problems:   HTN (hypertension)   Embolic stroke (HCC)   Type II diabetes mellitus with neurological manifestations (HCC)   Chronic  systolic CHF (congestive heart failure) (HCC)   Hyperlipidemia associated with type 2 diabetes mellitus (HCC)   Microcytic anemia   Patient Summary: Steven Cochran is a 52 y.o. person living with a history of HFrEF, stroke, hypertension, type 2 diabetes with neuropathy, hyperlipidemia, microcytic anemia, and left eye blindness who presented with left foot infection and admitted for osteomyelitis.    # Left fifth metatarsal and proximal phalanx osteomyelitis # Left fifth metatarsophalangeal joint septic arthritis # Diabetic foot infection He remains afebrile. No leukocytosis. ABI wnl and good perfusion on exam.  Orthopedics planning left fifth ray amputation, will confirm exact timing.  The patient wants to go over risks and benefits once again with the orthopedics team, will convey this to them.  Will continue IV antibiotics. - Appreciate orthopedic surgery recommendations - IV ceftriaxone, metronidazole, vancomycin - Trend CBC   # Diabetes, type II with neurologic manifestations  CBGs well-controlled. Will continue Semglee 10 units daily, SSI, and Jardiance 25 mg daily. - Semglee 10 units daily -  SSI - Jardiance 25 mg daily   # CHF (EF 45-50%)  # Hypertension Mildly hypertensive, euvolemic. Will continue antihypertensives and titrate up as needed. - Amlodipine 5 mg daily - Carvedilol 25 mg twice daily - Entresto 97-103 mg twice daily - Daily weights - Strict I's and O's   # AKI on CKD stage 3a  Baseline creatinine around 1.3.  Cr consistent around 1.6, AKI likely in the setting of poor po intake.  Diet has been resumed.  Will also give light fluids today and CTM. -Light IV fluid maintenance as needed - Trend BMP    # Previous CVA with no residual deficits # Hyperlipidemia Can continue aspirin and VTE prophylaxis until 24 hours before surgery. Will continue Crestor 40 mg daily - Hold aspirin - Crestor 40 mg  Diet:  Heart healthy, carb modified IVF: LR 75 cc/hr  VTE: Resumed today, hold 24 hours before procedure Code: Full PT/OT recs: Pending, . TOC recs: Pending Family Update:   Dispo: Anticipated discharge pending possible operative management of osteomyelitis and septic arthritis and subsequent PT/OT evaluation  Lyndle Herrlich, MD PGY-1 Internal Medicine Resident Please contact the on call pager after 5 pm and on weekends at [phone].

## 2022-09-23 DIAGNOSIS — I130 Hypertensive heart and chronic kidney disease with heart failure and stage 1 through stage 4 chronic kidney disease, or unspecified chronic kidney disease: Secondary | ICD-10-CM | POA: Diagnosis not present

## 2022-09-23 DIAGNOSIS — E1149 Type 2 diabetes mellitus with other diabetic neurological complication: Secondary | ICD-10-CM | POA: Diagnosis not present

## 2022-09-23 DIAGNOSIS — M86172 Other acute osteomyelitis, left ankle and foot: Secondary | ICD-10-CM | POA: Diagnosis not present

## 2022-09-23 DIAGNOSIS — M869 Osteomyelitis, unspecified: Secondary | ICD-10-CM | POA: Diagnosis not present

## 2022-09-23 DIAGNOSIS — E1169 Type 2 diabetes mellitus with other specified complication: Secondary | ICD-10-CM | POA: Diagnosis not present

## 2022-09-23 LAB — BASIC METABOLIC PANEL
Anion gap: 7 (ref 5–15)
BUN: 20 mg/dL (ref 6–20)
CO2: 26 mmol/L (ref 22–32)
Calcium: 8.5 mg/dL — ABNORMAL LOW (ref 8.9–10.3)
Chloride: 105 mmol/L (ref 98–111)
Creatinine, Ser: 1.51 mg/dL — ABNORMAL HIGH (ref 0.61–1.24)
GFR, Estimated: 56 mL/min — ABNORMAL LOW (ref >60)
Glucose, Bld: 117 mg/dL — ABNORMAL HIGH (ref 70–99)
Potassium: 3.8 mmol/L (ref 3.5–5.1)
Sodium: 138 mmol/L (ref 135–145)

## 2022-09-23 LAB — GLUCOSE, CAPILLARY
Glucose-Capillary: 103 mg/dL — ABNORMAL HIGH (ref 70–99)
Glucose-Capillary: 135 mg/dL — ABNORMAL HIGH (ref 70–99)
Glucose-Capillary: 106 mg/dL — ABNORMAL HIGH (ref 70–99)
Glucose-Capillary: 162 mg/dL — ABNORMAL HIGH (ref 70–99)

## 2022-09-23 LAB — CULTURE, BLOOD (ROUTINE X 2): Culture: NO GROWTH

## 2022-09-23 NOTE — Plan of Care (Signed)

## 2022-09-23 NOTE — Progress Notes (Signed)
Subjective:   Summary: Steven Cochran is a 52 y.o. year old male currently admitted on the IMTS HD#4 for osteomyelitis .  Overnight Events: NAEON.  Mildly hypertensive.  Patient resting initially. He reports that he feels well. No pain in lower extremity. He has been up walking to bathroom and to sit on couch at bedside.  No other acute symptoms.    Objective:  Vital signs in last 24 hours: Vitals:   09/22/22 1445 09/22/22 1447 09/22/22 1923 09/23/22 0541  BP: (!) 147/84 (!) 147/84 129/88 (!) 155/91  Pulse: 79 79 84 81  Resp:   18 16  Temp: 98 F (36.7 C) 98 F (36.7 C) 98 F (36.7 C) 98.3 F (36.8 C)  TempSrc: Oral Oral Oral   SpO2: 99% 99% 99% 98%  Weight:      Height:       Supplemental O2: Room Air SpO2: 98 %   Physical Exam:  Constitutional: Middle-aged male lying in bed. Appears comfortable and in no acute distress. Cardiovascular: Regular rate, regular rhythm. No murmurs, rubs, or gallops. Left DP and PT pulses palpable, extremities warm and well-perfused. Pulmonary: Normal respiratory effort. No wheezes, rales, rhonchi, or crackles.   Abdominal: Soft. Non-distended. Normal bowel sounds.      MSK: LLE with trace edema around ankle. RLE with no edema. Neurological: Sensation diminished in left foot. Skin: Warm and dry. Wound on lateral left foot near base of fifth toe covered with dressings, clean dry intact.  Filed Weights   09/20/22 0437 09/21/22 0702 09/22/22 0457  Weight: 115.7 kg 115.8 kg 116 kg     Intake/Output Summary (Last 24 hours) at 09/23/2022 0640 Last data filed at 09/22/2022 1529 Gross per 24 hour  Intake 240 ml  Output 550 ml  Net -310 ml    Net IO Since Admission: -2,044 mL [09/23/22 0640]  Pertinent Labs:    Latest Ref Rng & Units 09/21/2022    7:53 AM 09/20/2022    6:18 AM 09/19/2022    6:45 AM  CBC  WBC 4.0 - 10.5 K/uL 6.7  6.1  6.7   Hemoglobin 13.0 - 17.0 g/dL 8.5  8.8  8.7   Hematocrit 39.0 - 52.0 % 27.7   28.8  29.1   Platelets 150 - 400 K/uL 368  375  320        Latest Ref Rng & Units 09/23/2022    2:38 AM 09/22/2022    1:56 AM 09/21/2022    7:53 AM  CMP  Glucose 70 - 99 mg/dL 409  811  914   BUN 6 - 20 mg/dL 20  21  19    Creatinine 0.61 - 1.24 mg/dL 7.82  9.56  2.13   Sodium 135 - 145 mmol/L 138  138  137   Potassium 3.5 - 5.1 mmol/L 3.8  3.9  3.7   Chloride 98 - 111 mmol/L 105  104  104   CO2 22 - 32 mmol/L 26  25  27    Calcium 8.9 - 10.3 mg/dL 8.5  8.6  8.6      Imaging: No results found.   EKG:   Assessment/Plan:   Principal Problem:   Osteomyelitis of fifth toe of left foot (HCC) Active Problems:   HTN (hypertension)   Embolic stroke (HCC)   Type II diabetes mellitus with neurological manifestations (HCC)   Chronic systolic CHF (congestive heart failure) (HCC)  Hyperlipidemia associated with type 2 diabetes mellitus (HCC)   Microcytic anemia   Patient Summary: Steven Cochran is a 52 y.o. person living with a history of HFrEF, stroke, hypertension, type 2 diabetes with neuropathy, hyperlipidemia, microcytic anemia, and left eye blindness who presented with left foot infection and admitted for osteomyelitis.    # Left fifth metatarsal and proximal phalanx osteomyelitis # Left fifth metatarsophalangeal joint septic arthritis # Diabetic foot infection He remains afebrile. No leukocytosis. ABI wnl and good perfusion on exam.  Orthopedics planning left fifth ray amputation on Wednesday 5/22.  They went over risks and benefits with him today.  Anticipate he will be out of work for at least 1 month..  Will continue IV antibiotics. - Appreciate orthopedic surgery recommendations - IV ceftriaxone, metronidazole, vancomycin - Trend CBC   # Diabetes, type II with neurologic manifestations  CBGs well-controlled. Will continue Semglee 10 units daily, SSI, and Jardiance 25 mg daily. - Semglee 10 units daily - SSI - Jardiance 25 mg daily   # CHF (EF 45-50%)  #  Hypertension Mildly hypertensive, euvolemic. Will continue antihypertensives and titrate up as needed. - Amlodipine 5 mg daily - Carvedilol 25 mg twice daily - Entresto 97-103 mg twice daily - Daily weights - Strict I's and O's   # AKI on CKD stage 3a  Baseline creatinine around 1.3.  Cr 1.51 today.  Continue p.o. hydration today -Dehydration today, light IV fluid maintenance as needed - Trend BMP    # Previous CVA with no residual deficits # Hyperlipidemia Stopped aspirin, VTE prophylaxis until 24 hours before surgery. Will continue Crestor 40 mg daily - Hold aspirin - Crestor 40 mg  Diet:  Heart healthy, carb modified IVF: None VTE: Lovenox, hold 24 hours before procedure Code: Full PT/OT recs: Pending, . TOC recs: Pending Family Update:   Dispo: Anticipated discharge pending possible operative management of osteomyelitis and septic arthritis and subsequent PT/OT evaluation  Lyndle Herrlich, MD PGY-1 Internal Medicine Resident Please contact the on call pager after 5 pm and on weekends at [phone].

## 2022-09-23 NOTE — H&P (View-Only) (Signed)
Patient ID: Steven Cochran, male   DOB: May 18, 1970, 52 y.o.   MRN: [id number] Plan for a left foot fifth ray amputation on Wednesday.  Discussed with patient that he can discharge to home on Thursday.  Risk and benefits and postoperative care was discussed patient states he understands wished to proceed.  Anticipate out of work for at least 1 month.  Patient works part-time at [employer].

## 2022-09-23 NOTE — Progress Notes (Signed)
Patient ID: Steven Cochran, male   DOB: 07/27/1970, 51 y.o.   MRN: [id number] Plan for a left foot fifth ray amputation on Wednesday.  Discussed with patient that he can discharge to home on Thursday.  Risk and benefits and postoperative care was discussed patient states he understands wished to proceed.  Anticipate out of work for at least 1 month.  Patient works part-time at [employer]. 

## 2022-09-23 NOTE — Progress Notes (Signed)
Pharmacy Antibiotic Note  Steven Cochran is a 52 y.o. male admitted on 09/18/2022 with  osteomyelitis .  Pharmacy has been consulted for vancomycin dosing.  Scr baseline seems to vary - appears to be between 1.4-1.5. Creatinine remains stable with slight trend down, 1.51 today. Vancomycin dose decreased on 5/17. Today is day 5 of antibiotics. Patient is afebrile with temp of 98.6 F. No new CBC. Plan for amputation on 5/22 with discharge on 5/23 and anticipate antibiotic discontinuation at that point.   Plan: Continue vancomycin 750mg  IV q12h (eAUC 451, Scr 1.6, Vd 0.5) Continue ceftriaxone and Flagyl per MD Monitor renal function closely for futher dose adjustments  Height: 6' 2.5" (189.2 cm) Weight: 116 kg (255 lb 11.7 oz) IBW/kg (Calculated) : 83.35  Temp (24hrs), Avg:98.2 F (36.8 C), Min:98 F (36.7 C), Max:98.6 F (37 C)  Recent Labs  Lab 09/18/22 1350 09/18/22 1957 09/19/22 0645 09/20/22 0618 09/21/22 0753 09/22/22 0156 09/23/22 0238  WBC 8.3  --  6.7 6.1 6.7  --   --   CREATININE 1.58*  --  1.38* 1.64* 1.61* 1.63* 1.51*  LATICACIDVEN  --  0.9  --   --   --   --   --      Estimated Creatinine Clearance: 79.8 mL/min (A) (by C-G formula based on SCr of 1.51 mg/dL (H)).    Allergies  Allergen Reactions   Metformin And Related Diarrhea    Antimicrobials this admission:  Vancomycin 5/15 >>  Ceftriaxone 5/14 >>  Flagyl 5/14 >>  Dose adjustments this admission:  5/17:  Adj vanc 1000mg  q12h >> 750mg  q12 for new [facility] 451  Microbiology results:  5/14 BCx: negative  Thank you for allowing pharmacy to be a part of this patient's care.  Fara Olden, PharmD, BCPS, BCOP Clinical Pharmacist 09/23/2022 10:11 AM

## 2022-09-24 ENCOUNTER — Other Ambulatory Visit: Payer: Self-pay

## 2022-09-24 DIAGNOSIS — I10 Essential (primary) hypertension: Secondary | ICD-10-CM

## 2022-09-24 DIAGNOSIS — M86172 Other acute osteomyelitis, left ankle and foot: Secondary | ICD-10-CM | POA: Diagnosis not present

## 2022-09-24 DIAGNOSIS — E1169 Type 2 diabetes mellitus with other specified complication: Secondary | ICD-10-CM | POA: Diagnosis not present

## 2022-09-24 DIAGNOSIS — E1149 Type 2 diabetes mellitus with other diabetic neurological complication: Secondary | ICD-10-CM | POA: Diagnosis not present

## 2022-09-24 DIAGNOSIS — Z794 Long term (current) use of insulin: Secondary | ICD-10-CM

## 2022-09-24 LAB — BASIC METABOLIC PANEL
Anion gap: 8 (ref 5–15)
BUN: 18 mg/dL (ref 6–20)
CO2: 25 mmol/L (ref 22–32)
Calcium: 8.6 mg/dL — ABNORMAL LOW (ref 8.9–10.3)
Chloride: 105 mmol/L (ref 98–111)
Creatinine, Ser: 1.36 mg/dL — ABNORMAL HIGH (ref 0.61–1.24)
GFR, Estimated: >60 mL/min (ref >60)
Glucose, Bld: 83 mg/dL (ref 70–99)
Potassium: 3.9 mmol/L (ref 3.5–5.1)
Sodium: 138 mmol/L (ref 135–145)

## 2022-09-24 LAB — GLUCOSE, CAPILLARY
Glucose-Capillary: 77 mg/dL (ref 70–99)
Glucose-Capillary: 113 mg/dL — ABNORMAL HIGH (ref 70–99)
Glucose-Capillary: 73 mg/dL (ref 70–99)
Glucose-Capillary: 96 mg/dL (ref 70–99)

## 2022-09-24 MED ORDER — DORZOLAMIDE HCL-TIMOLOL MAL 2-0.5 % OP SOLN
1.0000 [drp] | Freq: Two times a day (BID) | OPHTHALMIC | Status: DC
  Administered 2022-09-24 – 2022-09-28 (×8): 1 [drp] via OPHTHALMIC
  Filled 2022-09-24: qty 10

## 2022-09-24 MED ORDER — FREESTYLE LIBRE 3 SENSOR MISC
1.0000 | 2 refills | Status: AC
Start: 2022-09-24 — End: 2022-12-23

## 2022-09-24 NOTE — TOC Progression Note (Addendum)
Transition of Care [facility]) - Progression Note    Patient Details  Name: Steven Cochran MRN: [id number] Date of Birth: April 07, 1971  [facility] CM/SW Contact  Gordy Clement, RN Phone Number: 09/24/2022, 2:30 PM  Clinical Narrative:   CM met with Patient bedside per patient request to discuss possible dc needs.    Patient will be having a L 5th toe amputation on Wednesday and was interested in discussing home health    He lives alone.  When asked about Daughter and Son assisting, he stated that his Son is 62 and lives with his Mother; Daughter lives in [place]. Patient states he has no friends or family to assist.   CM has reached out to provider to get an idea of wound care requirements after dc in effort to facilitate a safe dc to home.   TOC will continue to follow patient for any additional discharge needs   Update: Provider has responded and notified CM that patient will have a wound vac applied after surgery- No HH Wound Care necessary Will follow up as outpatient     Expected Discharge Plan: [facility] (works part-time @ [employer]) Barriers to Discharge: Continued Medical Work up  [employer] [facility]   Discharge Planning Services: CM Consult   Living arrangements for the past 2 months: Apartment (resides on the 3rd floor)                                       Social Determinants of Health (SDOH) Interventions SDOH Screenings   Food Insecurity: No Food Insecurity (07/12/2022)  Housing: Low Risk  (07/12/2022)  Transportation Needs: Unmet Transportation Needs (07/12/2022)  Utilities: Not At Risk (07/12/2022)  Alcohol Screen: Low Risk  (07/12/2022)  Depression (PHQ2-9): Low Risk  (07/12/2022)  Financial Resource Strain: Low Risk  (07/12/2022)  Physical Activity: Sufficiently Active (07/12/2022)  Social Connections: Socially Isolated (07/12/2022)  Stress: No Stress Concern Present (07/12/2022)  Tobacco Use: Low Risk  (09/18/2022)    Readmission  Risk Interventions     No data to display

## 2022-09-24 NOTE — Progress Notes (Signed)
   Subjective:  No significant overnight events. Patient feeling well this morning. He reports he has been able to get OOB without difficulty. He denies pain in his left foot. He denies fevers, chills, chest pain, and shortness of breath. He does report daily bowel movements since admission.  Objective:  Vital signs in last 24 hours: Vitals:   09/23/22 2015 09/24/22 0422 09/24/22 0500 09/24/22 0733  BP: (!) 149/87 (!) 142/99  (!) 159/86  Pulse: 75 91  79  Resp: 17 15    Temp: 98 F (36.7 C) 98.9 F (37.2 C)  97.8 F (36.6 C)  TempSrc:    Oral  SpO2: 100% 99%  98%  Weight:   116.4 kg   Height:       Physical Exam:  Constitutional: Middle-aged male lying in bed. Appears comfortable and in no acute distress. Cardiovascular: Regular rate, regular rhythm. No murmurs, rubs, or gallops. Extremities warm and well-perfused. Pulmonary: Normal respiratory effort. No wheezes, rales, rhonchi, or crackles.   Abdominal: Soft. Non-distended. Normal bowel sounds.      MSK: LLE with +1 pitting edema around ankle. RLE with trace edema. Neurological: Sensation diminished in left foot. Skin: Warm and dry. Wound on lateral left foot near base of fifth toe covered with dressings, clean dry intact.  Assessment/Plan:  Principal Problem:   Osteomyelitis of fifth toe of left foot (HCC) Active Problems:   HTN (hypertension)   Embolic stroke (HCC)   Type II diabetes mellitus with neurological manifestations (HCC)   Chronic systolic CHF (congestive heart failure) (HCC)   Hyperlipidemia associated with type 2 diabetes mellitus (HCC)   Microcytic anemia  Steven Cochran is a 52 y.o. person living with a history of HFrEF, stroke, hypertension, type 2 diabetes with neuropathy, hyperlipidemia, microcytic anemia, and left eye blindness who presented with left foot infection and admitted for osteomyelitis on HD#6.  # Left fifth metatarsal and proximal phalanx osteomyelitis # Left fifth metatarsophalangeal  joint septic arthritis # Diabetic foot infection Patient remains afebrile. He denies any LLE pain. Orthopedics planning left fifth ray amputation on Wednesday 5/22. Patient concerned about being out of work for 1 month but understands his foot will require healing. Will continue IV antibiotics.  - Appreciate orthopedic surgery recommendations - IV ceftriaxone, metronidazole, vancomycin - Trend CBC  # Diabetes, type II with neurologic manifestations  CBGs well-controlled. Will continue Semglee 10 units daily, SSI, and Jardiance 25 mg daily. - Semglee 10 units daily - SSI - Jardiance 25 mg daily  # CHF (EF 45-50%)  # Hypertension Mildly hypertensive but euvolemic on exam. Will continue antihypertensives and titrate up as needed. - Amlodipine 5 mg daily - Carvedilol 25 mg twice daily - Entresto 97-103 mg twice daily - Daily weights - Strict I's and O's  # AKI on CKD stage 3a, resolved AKI resolved with creatinine back to baseline at 1.36 today. Encouraged patient to continue PO fluid intake. -trend BMP  # Previous CVA with no residual deficits # Hyperlipidemia Stopped aspirin, VTE prophylaxis until 24 hours before surgery. Will continue Crestor 40 mg daily - Hold aspirin - Crestor 40 mg  Diet: HH/CM Bowel: None VTE: Lovenox, hold starting 5/21 AM IVF: none Code: Full LOS: day 5  Dispo: Anticipated discharge pending possible operative management of osteomyelitis and septic arthritis and subsequent PT/OT evaluation   Whitman Hero, Medical Student 09/24/2022, 10:47 AM

## 2022-09-24 NOTE — Telephone Encounter (Signed)
Please resend rx for sensor, per pharmacy patient had a procedure done and had to remove his sensor leading him to not having a sensor.

## 2022-09-25 ENCOUNTER — Other Ambulatory Visit: Payer: Self-pay | Admitting: Neurology

## 2022-09-25 DIAGNOSIS — I5022 Chronic systolic (congestive) heart failure: Secondary | ICD-10-CM

## 2022-09-25 DIAGNOSIS — E785 Hyperlipidemia, unspecified: Secondary | ICD-10-CM

## 2022-09-25 DIAGNOSIS — M86172 Other acute osteomyelitis, left ankle and foot: Secondary | ICD-10-CM | POA: Diagnosis not present

## 2022-09-25 DIAGNOSIS — Z794 Long term (current) use of insulin: Secondary | ICD-10-CM | POA: Diagnosis not present

## 2022-09-25 DIAGNOSIS — E1169 Type 2 diabetes mellitus with other specified complication: Secondary | ICD-10-CM | POA: Diagnosis not present

## 2022-09-25 LAB — BASIC METABOLIC PANEL
Anion gap: 7 (ref 5–15)
BUN: 18 mg/dL (ref 6–20)
CO2: 26 mmol/L (ref 22–32)
Calcium: 8.5 mg/dL — ABNORMAL LOW (ref 8.9–10.3)
Chloride: 104 mmol/L (ref 98–111)
Creatinine, Ser: 1.3 mg/dL — ABNORMAL HIGH (ref 0.61–1.24)
GFR, Estimated: >60 mL/min (ref >60)
Glucose, Bld: 92 mg/dL (ref 70–99)
Potassium: 4 mmol/L (ref 3.5–5.1)
Sodium: 137 mmol/L (ref 135–145)

## 2022-09-25 LAB — CBC
HCT: 28.9 % — ABNORMAL LOW (ref 39.0–52.0)
Hemoglobin: 8.9 g/dL — ABNORMAL LOW (ref 13.0–17.0)
MCH: 21.7 pg — ABNORMAL LOW (ref 26.0–34.0)
MCHC: 30.8 g/dL (ref 30.0–36.0)
MCV: 70.3 fL — ABNORMAL LOW (ref 80.0–100.0)
Platelets: 404 10*3/uL — ABNORMAL HIGH (ref 150–400)
RBC: 4.11 MIL/uL — ABNORMAL LOW (ref 4.22–5.81)
RDW: 14.7 % (ref 11.5–15.5)
WBC: 6.5 10*3/uL (ref 4.0–10.5)
nRBC: 0 % (ref 0.0–0.2)

## 2022-09-25 LAB — GLUCOSE, CAPILLARY
Glucose-Capillary: 86 mg/dL (ref 70–99)
Glucose-Capillary: 145 mg/dL — ABNORMAL HIGH (ref 70–99)
Glucose-Capillary: 105 mg/dL — ABNORMAL HIGH (ref 70–99)
Glucose-Capillary: 213 mg/dL — ABNORMAL HIGH (ref 70–99)
Glucose-Capillary: 218 mg/dL — ABNORMAL HIGH (ref 70–99)

## 2022-09-25 MED ORDER — CEFAZOLIN SODIUM-DEXTROSE 2-4 GM/100ML-% IV SOLN
2.0000 g | INTRAVENOUS | Status: AC
  Administered 2022-09-26: 2 g via INTRAVENOUS
  Filled 2022-09-25: qty 100

## 2022-09-25 MED ORDER — POVIDONE-IODINE 10 % EX SWAB
2.0000 | Freq: Once | CUTANEOUS | Status: AC
  Administered 2022-09-26: 2 via TOPICAL

## 2022-09-25 MED ORDER — INSULIN GLARGINE-YFGN 100 UNIT/ML ~~LOC~~ SOLN
5.0000 [IU] | Freq: Every day | SUBCUTANEOUS | Status: DC
  Administered 2022-09-25: 5 [IU] via SUBCUTANEOUS
  Filled 2022-09-25 (×2): qty 0.05

## 2022-09-25 MED ORDER — CHLORHEXIDINE GLUCONATE 4 % EX SOLN
60.0000 mL | Freq: Once | CUTANEOUS | Status: DC
  Filled 2022-09-25: qty 60

## 2022-09-25 NOTE — Anesthesia Preprocedure Evaluation (Signed)
Anesthesia Evaluation  Patient identified by MRN, date of birth, ID band Patient awake    Reviewed: Allergy & Precautions, NPO status , Patient's Chart, lab work & pertinent test results  History of Anesthesia Complications Negative for: history of anesthetic complications  Airway Mallampati: II  TM Distance: >3 FB Neck ROM: Full    Dental  (+) Poor Dentition, Missing,    Pulmonary neg pulmonary ROS   Pulmonary exam normal        Cardiovascular hypertension, Pt. on medications and Pt. on home beta blockers Normal cardiovascular exam  EF 45-50%   Neuro/Psych CVA  negative psych ROS   GI/Hepatic negative GI ROS, Neg liver ROS,,,  Endo/Other  diabetes, Type 2, Insulin Dependent, Oral Hypoglycemic Agents    Renal/GU Renal InsufficiencyRenal disease     Musculoskeletal Osteomyelitis left foot   Abdominal   Peds  Hematology  (+) Blood dyscrasia (Hgb 8.9), anemia   Anesthesia Other Findings Day of surgery medications reviewed with patient.  Reproductive/Obstetrics                              Anesthesia Physical Anesthesia Plan  ASA: 3  Anesthesia Plan: MAC and Regional   Post-op Pain Management: Tylenol PO (pre-op)*   Induction:   PONV Risk Score and Plan: 1 and Propofol infusion, Treatment may vary due to age or medical condition, Midazolam and Ondansetron  Airway Management Planned: Natural Airway and Simple Face Mask  Additional Equipment: None  Intra-op Plan:   Post-operative Plan:   Informed Consent: I have reviewed the patients History and Physical, chart, labs and discussed the procedure including the risks, benefits and alternatives for the proposed anesthesia with the patient or authorized representative who has indicated his/her understanding and acceptance.       Plan Discussed with: CRNA  Anesthesia Plan Comments:          Anesthesia Quick Evaluation

## 2022-09-25 NOTE — Care Management Important Message (Signed)
Important Message  Patient Details  Name: Steven Cochran MRN: [id number] Date of Birth: 1971/02/20   Medicare Important Message Given:  Yes     Sherilyn Banker 09/25/2022, 12:02 PM

## 2022-09-25 NOTE — Progress Notes (Addendum)
Subjective:  No significant overnight events. Patient reports he is feeling well this morning. He was able to get a decent amount of sleep overnight. He denies experiencing any pain in his left foot. He is ambulating to the bathroom and bedside couch without difficulty. He denies fevers, chills, chest pain, and shortness of breath. Discussed plan for surgery tomorrow. Patient concerned about post-op wound care and infection management, but all answers were answered to his satisfaction.  Objective:  Vital signs in last 24 hours: Vitals:   09/24/22 2100 09/25/22 0600 09/25/22 0610 09/25/22 0742  BP: (!) 151/89 (!) 153/88  (!) 158/95  Pulse: 82 80  78  Resp: 16 16    Temp: 98.4 F (36.9 C) 97.7 F (36.5 C)  97.6 F (36.4 C)  TempSrc: Oral Oral  Oral  SpO2: 99% 99%  95%  Weight:   116 kg   Height:       Weight change: -0.4 kg  Intake/Output Summary (Last 24 hours) at 09/25/2022 1130 Last data filed at 09/25/2022 0900 Gross per 24 hour  Intake 980 ml  Output 1050 ml  Net -70 ml    Physical Exam:  Constitutional: Middle-aged male lying in bed. Appears comfortable and in no acute distress. Cardiovascular: Regular rate, regular rhythm. No murmurs, rubs, or gallops. Extremities warm and well-perfused.  Pulmonary: Normal respiratory effort. No wheezes, rales, rhonchi, or crackles.   Abdominal: Soft. Non-distended. Normal bowel sounds.      MSK: LLE with +1 pitting edema > RLE with trace edema. No asymmetric erythema or calf tenderness. Neurological: Sensation diminished in left foot. Skin: Warm and dry. Wound on lateral left foot near base of fifth toe with serosanguinous drainage and surrounding skin maceration.   Assessment/Plan:  Principal Problem:   Osteomyelitis of fifth toe of left foot (HCC) Active Problems:   HTN (hypertension)   Embolic stroke (HCC)   Type II diabetes mellitus with neurological manifestations (HCC)   Chronic systolic CHF (congestive heart failure)  (HCC)   Hyperlipidemia associated with type 2 diabetes mellitus (HCC)   Microcytic anemia   Steven Cochran is a 52 y.o. person living with a history of HFrEF, stroke, hypertension, type 2 diabetes with neuropathy, hyperlipidemia, microcytic anemia, and left eye blindness who presented with left foot infection and admitted for osteomyelitis on HD#7.   # Left fifth metatarsal and proximal phalanx osteomyelitis # Left fifth metatarsophalangeal joint septic arthritis # Diabetic foot infection Patient denies experiencing any left foot pain. Patient remains afebrile. No leukocytosis with WBC of 6.5 this morning. LLE with +1 pitting edema > RLE with trace edema. No asymmetric erythema or calf tenderness. Patient states LE edema is chronic as he usually wears compression stockings at home. Suspect current differences in edema between lower extremities is due to ongoing infection of left foot. Patient has also been spending more time OOB with feet not elevated, which could be contributing. Orthopedics planning left fifth ray amputation tomorrow (5/22). Will continue IV antibiotics.  - Appreciate orthopedic surgery recommendations - NPO at midnight - IV ceftriaxone, metronidazole, vancomycin - Trend CBC   # Diabetes, type II with neurologic manifestations  CBGs well-controlled with fasting glucose 92 this morning. Will continue SSI and Jardiance 25 mg daily. Will decrease evening Semglee to 5 units since surgery tomorrow. - Decrease to Semglee 5 units tonight - SSI - Jardiance 25 mg daily   # Chronic HFmrEF (EF 45-50%)  # Hypertension Patient remains mildly hypertensive with MAPs in 100-110s. Will continue  home antihypertensives while hospitalized. Recommend starting spironolactone outpatient to follow GDMT and better manage blood pressure. - Amlodipine 5 mg daily - Carvedilol 25 mg twice daily - Entresto 97-103 mg twice daily - consider starting spironolactone outpatient - Daily weights - Strict  I's and O's   # AKI on CKD stage 3a, resolved AKI resolved with creatinine back to baseline at 1.30 today. Will continue monitoring renal function given surgery tomorrow. -trend BMP   # Previous CVA with no residual deficits # Hyperlipidemia Held aspirin on admission due to potential for surgical intervention. VTE prophylaxis continued throughout hospitalization but held today for surgery on 5/22. Will continue Crestor 40 mg daily. - Hold aspirin - Hold VTE prophylaxis - Crestor 40 mg   Diet: HH/CM Bowel: None VTE: None (holding for surgery) IVF: none Code: Full LOS: day 7   Dispo: Anticipated discharge pending operative management of osteomyelitis and septic arthritis and subsequent PT/OT evaluation   Whitman Hero, Medical Student 09/25/2022, 11:30 AM    I have seen and examined the patient, and reviewed the daily progress note by Gillermina Phy, MS 3 and discussed the care of the patient with them.   Signed:  Lyndle Herrlich, MD 09/25/2022, 2:04 PM

## 2022-09-25 NOTE — Progress Notes (Signed)
CSW met with pt regarding SDOH transportation.  Pt reports he uses [facility] to get to medical appts, but could use a better option.  Discussed medicaid transportation, contact information provided, explained process for signing up.  Pt verbalized understanding. Daleen Squibb, MSW, LCSW 5/21/20243:29 PM

## 2022-09-26 ENCOUNTER — Other Ambulatory Visit: Payer: Self-pay

## 2022-09-26 ENCOUNTER — Inpatient Hospital Stay (HOSPITAL_COMMUNITY): Payer: Medicare HMO | Admitting: Certified Registered"

## 2022-09-26 ENCOUNTER — Encounter (HOSPITAL_COMMUNITY): Payer: Self-pay | Admitting: Internal Medicine

## 2022-09-26 ENCOUNTER — Encounter: Payer: Medicare HMO | Admitting: Student

## 2022-09-26 ENCOUNTER — Encounter (HOSPITAL_COMMUNITY)
Admission: EM | Disposition: A | Discharge status: Home Health | Payer: Self-pay | Source: Home / Self Care | Attending: Internal Medicine

## 2022-09-26 DIAGNOSIS — Z794 Long term (current) use of insulin: Secondary | ICD-10-CM

## 2022-09-26 DIAGNOSIS — I10 Essential (primary) hypertension: Secondary | ICD-10-CM | POA: Diagnosis not present

## 2022-09-26 DIAGNOSIS — E1169 Type 2 diabetes mellitus with other specified complication: Secondary | ICD-10-CM | POA: Diagnosis not present

## 2022-09-26 DIAGNOSIS — Z7984 Long term (current) use of oral hypoglycemic drugs: Secondary | ICD-10-CM | POA: Diagnosis not present

## 2022-09-26 DIAGNOSIS — E1149 Type 2 diabetes mellitus with other diabetic neurological complication: Secondary | ICD-10-CM | POA: Diagnosis not present

## 2022-09-26 DIAGNOSIS — M869 Osteomyelitis, unspecified: Secondary | ICD-10-CM | POA: Diagnosis not present

## 2022-09-26 DIAGNOSIS — M86172 Other acute osteomyelitis, left ankle and foot: Secondary | ICD-10-CM | POA: Diagnosis not present

## 2022-09-26 HISTORY — PX: AMPUTATION: SHX166

## 2022-09-26 LAB — BASIC METABOLIC PANEL
Anion gap: 4 — ABNORMAL LOW (ref 5–15)
BUN: 22 mg/dL — ABNORMAL HIGH (ref 6–20)
CO2: 25 mmol/L (ref 22–32)
Calcium: 8.5 mg/dL — ABNORMAL LOW (ref 8.9–10.3)
Chloride: 105 mmol/L (ref 98–111)
Creatinine, Ser: 1.3 mg/dL — ABNORMAL HIGH (ref 0.61–1.24)
GFR, Estimated: >60 mL/min (ref >60)
Glucose, Bld: 159 mg/dL — ABNORMAL HIGH (ref 70–99)
Potassium: 3.9 mmol/L (ref 3.5–5.1)
Sodium: 134 mmol/L — ABNORMAL LOW (ref 135–145)

## 2022-09-26 LAB — CBC
HCT: 28.9 % — ABNORMAL LOW (ref 39.0–52.0)
Hemoglobin: 8.9 g/dL — ABNORMAL LOW (ref 13.0–17.0)
MCH: 21.5 pg — ABNORMAL LOW (ref 26.0–34.0)
MCHC: 30.8 g/dL (ref 30.0–36.0)
MCV: 70 fL — ABNORMAL LOW (ref 80.0–100.0)
Platelets: 423 10*3/uL — ABNORMAL HIGH (ref 150–400)
RBC: 4.13 MIL/uL — ABNORMAL LOW (ref 4.22–5.81)
RDW: 14.7 % (ref 11.5–15.5)
WBC: 6.5 10*3/uL (ref 4.0–10.5)
nRBC: 0 % (ref 0.0–0.2)

## 2022-09-26 LAB — GLUCOSE, CAPILLARY
Glucose-Capillary: 130 mg/dL — ABNORMAL HIGH (ref 70–99)
Glucose-Capillary: 110 mg/dL — ABNORMAL HIGH (ref 70–99)
Glucose-Capillary: 114 mg/dL — ABNORMAL HIGH (ref 70–99)
Glucose-Capillary: 101 mg/dL — ABNORMAL HIGH (ref 70–99)
Glucose-Capillary: 135 mg/dL — ABNORMAL HIGH (ref 70–99)
Glucose-Capillary: 146 mg/dL — ABNORMAL HIGH (ref 70–99)

## 2022-09-26 SURGERY — AMPUTATION, FOOT, RAY
Anesthesia: Monitor Anesthesia Care | Laterality: Left

## 2022-09-26 MED ORDER — METOPROLOL TARTRATE 5 MG/5ML IV SOLN: 2.0000 mg | INTRAVENOUS | Status: DC | PRN

## 2022-09-26 MED ORDER — PHENOL 1.4 % MT LIQD: 1.0000 | OROMUCOSAL | Status: DC | PRN

## 2022-09-26 MED ORDER — ALUM & MAG HYDROXIDE-SIMETH 200-200-20 MG/5ML PO SUSP: 15.0000 mL | ORAL | Status: DC | PRN

## 2022-09-26 MED ORDER — MIDAZOLAM HCL 2 MG/2ML IJ SOLN
INTRAMUSCULAR | Status: AC
  Administered 2022-09-26: 1 mg via INTRAVENOUS
  Filled 2022-09-26: qty 2

## 2022-09-26 MED ORDER — ZINC SULFATE 220 (50 ZN) MG PO CAPS
220.0000 mg | ORAL_CAPSULE | Freq: Every day | ORAL | Status: DC
  Administered 2022-09-26 – 2022-09-28 (×3): 220 mg via ORAL
  Filled 2022-09-26 (×3): qty 1

## 2022-09-26 MED ORDER — DOCUSATE SODIUM 100 MG PO CAPS: 100.0000 mg | ORAL_CAPSULE | Freq: Every day | ORAL | Status: DC

## 2022-09-26 MED ORDER — ACETAMINOPHEN 500 MG PO TABS
1000.0000 mg | ORAL_TABLET | Freq: Once | ORAL | Status: AC
  Administered 2022-09-26: 1000 mg via ORAL
  Filled 2022-09-26: qty 2

## 2022-09-26 MED ORDER — MAGNESIUM CITRATE PO SOLN: 1.0000 | Freq: Once | ORAL | Status: DC | PRN

## 2022-09-26 MED ORDER — AMISULPRIDE (ANTIEMETIC) 5 MG/2ML IV SOLN: 10.0000 mg | Freq: Once | INTRAVENOUS | Status: DC | PRN

## 2022-09-26 MED ORDER — SODIUM CHLORIDE 0.9 % IV SOLN: INTRAVENOUS | Status: DC

## 2022-09-26 MED ORDER — FENTANYL CITRATE (PF) 100 MCG/2ML IJ SOLN
INTRAMUSCULAR | Status: AC
  Administered 2022-09-26: 50 ug via INTRAVENOUS
  Filled 2022-09-26: qty 2

## 2022-09-26 MED ORDER — GUAIFENESIN-DM 100-10 MG/5ML PO SYRP: 15.0000 mL | ORAL_SOLUTION | ORAL | Status: DC | PRN

## 2022-09-26 MED ORDER — VITAMIN C 500 MG PO TABS
1000.0000 mg | ORAL_TABLET | Freq: Every day | ORAL | Status: DC
  Administered 2022-09-26 – 2022-09-28 (×3): 1000 mg via ORAL
  Filled 2022-09-26 (×3): qty 2

## 2022-09-26 MED ORDER — CEFAZOLIN SODIUM-DEXTROSE 2-4 GM/100ML-% IV SOLN
2.0000 g | Freq: Three times a day (TID) | INTRAVENOUS | Status: AC
  Administered 2022-09-26 – 2022-09-27 (×2): 2 g via INTRAVENOUS
  Filled 2022-09-26 (×2): qty 100

## 2022-09-26 MED ORDER — INSULIN GLARGINE-YFGN 100 UNIT/ML ~~LOC~~ SOLN
10.0000 [IU] | Freq: Every day | SUBCUTANEOUS | Status: DC
  Administered 2022-09-26 – 2022-09-27 (×2): 10 [IU] via SUBCUTANEOUS
  Filled 2022-09-26 (×4): qty 0.1

## 2022-09-26 MED ORDER — OXYCODONE HCL 5 MG/5ML PO SOLN: 5.0000 mg | Freq: Once | ORAL | Status: DC | PRN

## 2022-09-26 MED ORDER — HYDROMORPHONE HCL 1 MG/ML IJ SOLN: 0.5000 mg | INTRAMUSCULAR | Status: DC | PRN

## 2022-09-26 MED ORDER — POLYETHYLENE GLYCOL 3350 17 G PO PACK: 17.0000 g | PACK | Freq: Every day | ORAL | Status: DC | PRN

## 2022-09-26 MED ORDER — MAGNESIUM SULFATE 2 GM/50ML IV SOLN: 2.0000 g | Freq: Every day | INTRAVENOUS | Status: DC | PRN

## 2022-09-26 MED ORDER — 0.9 % SODIUM CHLORIDE (POUR BTL) OPTIME
TOPICAL | Status: DC | PRN
  Administered 2022-09-26: 1000 mL

## 2022-09-26 MED ORDER — CHLORHEXIDINE GLUCONATE 0.12 % MT SOLN: 15.0000 mL | Freq: Once | OROMUCOSAL | Status: AC

## 2022-09-26 MED ORDER — OXYCODONE HCL 5 MG PO TABS: 5.0000 mg | ORAL_TABLET | ORAL | Status: DC | PRN

## 2022-09-26 MED ORDER — OXYCODONE HCL 5 MG PO TABS: 5.0000 mg | ORAL_TABLET | Freq: Once | ORAL | Status: DC | PRN

## 2022-09-26 MED ORDER — ACETAMINOPHEN 325 MG PO TABS: 325.0000 mg | ORAL_TABLET | Freq: Four times a day (QID) | ORAL | Status: DC | PRN

## 2022-09-26 MED ORDER — FENTANYL CITRATE (PF) 100 MCG/2ML IJ SOLN: 50.0000 ug | Freq: Once | INTRAMUSCULAR | Status: AC

## 2022-09-26 MED ORDER — LABETALOL HCL 5 MG/ML IV SOLN: 10.0000 mg | INTRAVENOUS | Status: DC | PRN

## 2022-09-26 MED ORDER — ONDANSETRON HCL 4 MG/2ML IJ SOLN: 4.0000 mg | Freq: Four times a day (QID) | INTRAMUSCULAR | Status: DC | PRN

## 2022-09-26 MED ORDER — OXYCODONE HCL 5 MG PO TABS: 10.0000 mg | ORAL_TABLET | ORAL | Status: DC | PRN

## 2022-09-26 MED ORDER — ORAL CARE MOUTH RINSE: 15.0000 mL | Freq: Once | OROMUCOSAL | Status: AC

## 2022-09-26 MED ORDER — BISACODYL 5 MG PO TBEC: 5.0000 mg | DELAYED_RELEASE_TABLET | Freq: Every day | ORAL | Status: DC | PRN

## 2022-09-26 MED ORDER — CHLORHEXIDINE GLUCONATE 0.12 % MT SOLN
OROMUCOSAL | Status: AC
  Administered 2022-09-26: 15 mL via OROMUCOSAL
  Filled 2022-09-26: qty 15

## 2022-09-26 MED ORDER — SENNA 8.6 MG PO TABS
1.0000 | ORAL_TABLET | Freq: Every day | ORAL | Status: DC
  Administered 2022-09-27: 8.6 mg via ORAL
  Filled 2022-09-26 (×3): qty 1

## 2022-09-26 MED ORDER — JUVEN PO PACK
1.0000 | PACK | Freq: Two times a day (BID) | ORAL | Status: DC
  Administered 2022-09-26 – 2022-09-28 (×5): 1 via ORAL
  Filled 2022-09-26 (×5): qty 1

## 2022-09-26 MED ORDER — MIDAZOLAM HCL 2 MG/2ML IJ SOLN: 1.0000 mg | Freq: Once | INTRAMUSCULAR | Status: AC

## 2022-09-26 MED ORDER — HYDRALAZINE HCL 20 MG/ML IJ SOLN: 5.0000 mg | INTRAMUSCULAR | Status: DC | PRN

## 2022-09-26 MED ORDER — FENTANYL CITRATE (PF) 100 MCG/2ML IJ SOLN: 25.0000 ug | INTRAMUSCULAR | Status: DC | PRN

## 2022-09-26 MED ORDER — BUPIVACAINE HCL (PF) 0.5 % IJ SOLN
INTRAMUSCULAR | Status: DC | PRN
  Administered 2022-09-26: 30 mL via PERINEURAL

## 2022-09-26 MED ORDER — PROPOFOL 500 MG/50ML IV EMUL
INTRAVENOUS | Status: DC | PRN
  Administered 2022-09-26: 100 ug/kg/min via INTRAVENOUS

## 2022-09-26 MED ORDER — PANTOPRAZOLE SODIUM 40 MG PO TBEC
40.0000 mg | DELAYED_RELEASE_TABLET | Freq: Every day | ORAL | Status: DC
  Administered 2022-09-27 – 2022-09-28 (×2): 40 mg via ORAL
  Filled 2022-09-26 (×2): qty 1

## 2022-09-26 MED ORDER — POTASSIUM CHLORIDE CRYS ER 20 MEQ PO TBCR: 20.0000 meq | EXTENDED_RELEASE_TABLET | Freq: Every day | ORAL | Status: DC | PRN

## 2022-09-26 MED ORDER — LACTATED RINGERS IV SOLN: INTRAVENOUS | Status: DC

## 2022-09-26 MED ORDER — PHENYLEPHRINE HCL (PRESSORS) 10 MG/ML IV SOLN
INTRAVENOUS | Status: DC | PRN
  Administered 2022-09-26 (×2): 160 ug via INTRAVENOUS

## 2022-09-26 SURGICAL SUPPLY — 36 items
BAG COUNTER SPONGE SURGICOUNT (BAG) ×2 IMPLANT
BAG SPNG CNTER NS LX DISP (BAG)
BLADE SAW SGTL MED 73X18.5 STR (BLADE) IMPLANT
BLADE SURG 21 STRL SS (BLADE) ×2 IMPLANT
BNDG CMPR 5X4 CHSV STRCH STRL (GAUZE/BANDAGES/DRESSINGS) ×1
BNDG COHESIVE 4X5 TAN STRL (GAUZE/BANDAGES/DRESSINGS) ×2 IMPLANT
BNDG COHESIVE 4X5 TAN STRL LF (GAUZE/BANDAGES/DRESSINGS) IMPLANT
BNDG COHESIVE 6X5 TAN NS LF (GAUZE/BANDAGES/DRESSINGS) IMPLANT
BNDG GAUZE DERMACEA FLUFF 4 (GAUZE/BANDAGES/DRESSINGS) ×2 IMPLANT
BNDG GZE DERMACEA 4 6PLY (GAUZE/BANDAGES/DRESSINGS)
COVER SURGICAL LIGHT HANDLE (MISCELLANEOUS) ×4 IMPLANT
DRAPE DERMATAC (DRAPES) IMPLANT
DRAPE U-SHAPE 47X51 STRL (DRAPES) ×4 IMPLANT
DRESSING PEEL AND PLC PRVNA 13 (GAUZE/BANDAGES/DRESSINGS) IMPLANT
DRSG ADAPTIC 3X8 NADH LF (GAUZE/BANDAGES/DRESSINGS) ×2 IMPLANT
DRSG PEEL AND PLACE PREVENA 13 (GAUZE/BANDAGES/DRESSINGS) ×1
DURAPREP 26ML APPLICATOR (WOUND CARE) ×2 IMPLANT
ELECT REM PT RETURN 9FT ADLT (ELECTROSURGICAL) ×1
ELECTRODE REM PT RTRN 9FT ADLT (ELECTROSURGICAL) ×2 IMPLANT
GAUZE PAD ABD 8X10 STRL (GAUZE/BANDAGES/DRESSINGS) ×4 IMPLANT
GAUZE SPONGE 4X4 12PLY STRL (GAUZE/BANDAGES/DRESSINGS) ×2 IMPLANT
GLOVE BIOGEL PI IND STRL 9 (GLOVE) ×2 IMPLANT
GLOVE SURG ORTHO 9.0 STRL STRW (GLOVE) ×2 IMPLANT
GOWN STRL REUS W/ TWL XL LVL3 (GOWN DISPOSABLE) ×4 IMPLANT
GOWN STRL REUS W/TWL XL LVL3 (GOWN DISPOSABLE) ×2
GRAFT SKIN WND MICRO 38 (Tissue) IMPLANT
KIT BASIN OR (CUSTOM PROCEDURE TRAY) ×2 IMPLANT
KIT TURNOVER KIT B (KITS) ×2 IMPLANT
NS IRRIG 1000ML POUR BTL (IV SOLUTION) ×2 IMPLANT
PACK ORTHO EXTREMITY (CUSTOM PROCEDURE TRAY) ×2 IMPLANT
PAD ARMBOARD 7.5X6 YLW CONV (MISCELLANEOUS) ×4 IMPLANT
STOCKINETTE IMPERVIOUS LG (DRAPES) IMPLANT
SUT ETHILON 2 0 PSLX (SUTURE) ×2 IMPLANT
TOWEL GREEN STERILE (TOWEL DISPOSABLE) ×2 IMPLANT
TUBE CONNECTING 12X1/4 (SUCTIONS) ×2 IMPLANT
YANKAUER SUCT BULB TIP NO VENT (SUCTIONS) ×2 IMPLANT

## 2022-09-26 NOTE — Progress Notes (Signed)
Subjective:  No significant overnight events. Patient reports he is feeling well this morning. He denies experiencing any pain in his left foot. He is able to ambulate around the room without difficulty. He denies fevers, chills, chest pain, and shortness of breath. Discussed plan for surgery today with PT/OT evaluation following. All questions were answered to his satisfaction.  Objective:  Vital signs in last 24 hours: Vitals:   09/26/22 0900 09/26/22 0905 09/26/22 0910 09/26/22 0915  BP: (!) 175/91 (!) 163/93 (!) 168/93   Pulse: 71 76 73 71  Resp: 10 11 10 10   Temp:      TempSrc:      SpO2: 100% 99% 100% 100%  Weight:      Height:        Intake/Output Summary (Last 24 hours) at 09/26/2022 0949 Last data filed at 09/26/2022 3086 Gross per 24 hour  Intake --  Output 700 ml  Net -700 ml   Physical Exam:  Constitutional: Appears stated age. Lying in bed. Appears comfortable and in no acute distress. Cardiovascular: Regular rate, regular rhythm. No murmurs, rubs, or gallops. Extremities warm and well-perfused.  Pulmonary: Normal respiratory effort. No wheezes, rales, rhonchi, or crackles.   Abdominal: Soft. Non-distended. Normal bowel sounds.      MSK: LLE with +1 pitting edema > RLE with trace edema. No asymmetric erythema or calf tenderness. Neurological: Sensation diminished in left foot. Skin: Warm and dry. Wound on lateral left foot near base of fifth toe with serosanguinous drainage and surrounding skin maceration.   Assessment/Plan:  Principal Problem:   Osteomyelitis of fifth toe of left foot (HCC) Active Problems:   HTN (hypertension)   Embolic stroke (HCC)   Type II diabetes mellitus with neurological manifestations (HCC)   Chronic systolic CHF (congestive heart failure) (HCC)   Hyperlipidemia associated with type 2 diabetes mellitus (HCC)   Microcytic anemia  Steven Cochran is a 52 y.o. person living with a history of HFrEF, stroke, hypertension, type 2  diabetes with neuropathy, hyperlipidemia, microcytic anemia, and left eye blindness who presented with left foot infection and admitted for osteomyelitis on HD#8.   # Left fifth metatarsal and proximal phalanx osteomyelitis # Left fifth metatarsophalangeal joint septic arthritis # Diabetic foot infection Patient denies experiencing any left foot pain. Patient remains afebrile. No leukocytosis with WBC of 6.5 this morning. LLE with +1 pitting edema around ankle > RLE with trace edema. No asymmetric erythema or calf tenderness. Patient states LE edema is chronic as he usually wears compression stockings at home. Suspect current differences in edema between lower extremities is due to ongoing infection of left foot. Patient has also been spending more time OOB with feet not elevated, which could be contributing. Will continue monitoring volume status. Left fifth ray amputation today with orthopedics. PT/OT have been consulted for post-op evaluation. Patient may be stable for discharge tomorrow pending PT/OT recommendations. Will continue IV antibiotics with plans to modify regimen based on operative findings and/or bone cultures. - Appreciate orthopedic surgery recommendations - PT/OT evaluation today - IV ceftriaxone, metronidazole, vancomycin - Trend CBC   # Diabetes, type II with neurologic manifestations  CBGs well-controlled. Will resume Semglee 10 units tonight. Continue SSI and Jardiance 25 mg daily. - Resume Semglee 10 units tonight - SSI - Jardiance 25 mg daily   # Chronic HFmrEF (EF 45-50%)  # Hypertension Patient remains mildly hypertensive with MAPs in 110s. Will continue home antihypertensives while hospitalized. Recommend starting spironolactone outpatient to follow GDMT and  better manage blood pressure. - Amlodipine 5 mg daily - Carvedilol 25 mg twice daily - Entresto 97-103 mg twice daily - consider starting spironolactone outpatient - Daily weights - Strict I's and O's   #  AKI on CKD stage 3a, resolved AKI resolved with creatinine back to baseline at 1.30 today. Will continue monitoring renal function given surgery today. -trend BMP   # Previous CVA with no residual deficits # Hyperlipidemia Held aspirin on admission due to potential for surgical intervention. VTE prophylaxis continued throughout hospitalization but held starting 5/21 for surgery. Will resume Lovenox tomorrow (5/23) since it will be 24 hours after surgery. Will continue Crestor 40 mg daily. - Hold aspirin - Resume VTE prophylaxis on 5/23 - Crestor 40 mg   Diet: HH/CM Bowel: None VTE: None (holding for surgery) IVF: none Code: Full LOS: day 8   Dispo: Anticipated discharge pending operative management of osteomyelitis and septic arthritis and subsequent PT/OT evaluation   Whitman Hero, Medical Student 09/26/2022, 9:49 AM

## 2022-09-26 NOTE — Interval H&P Note (Signed)
History and Physical Interval Note:  09/26/2022 6:51 AM  Steven Cochran  has presented today for surgery, with the diagnosis of Osteomyelitis Left 5th Toe.  The various methods of treatment have been discussed with the patient and family. After consideration of risks, benefits and other options for treatment, the patient has consented to  Procedure(s): LEFT FOOT 5TH RAY AMPUTATION (Left) as a surgical intervention.  The patient's history has been reviewed, patient examined, no change in status, stable for surgery.  I have reviewed the patient's chart and labs.  Questions were answered to the patient's satisfaction.     Nadara Mustard

## 2022-09-26 NOTE — Transfer of Care (Signed)
Immediate Anesthesia Transfer of Care Note  Patient: Steven Cochran  Procedure(s) Performed: LEFT FOOT 5TH RAY AMPUTATION (Left)  Patient Location: PACU  Anesthesia Type:Regional  Level of Consciousness: awake and alert   Airway & Oxygen Therapy: Patient Spontanous Breathing  Post-op Assessment: Report given to RN and Post -op Vital signs reviewed and stable  Post vital signs: Reviewed and stable  Last Vitals:  Vitals Value Taken Time  BP 125/82 09/26/22 1018  Temp    Pulse 76 09/26/22 1020  Resp 15 09/26/22 1020  SpO2 98 % 09/26/22 1020  Vitals shown include unvalidated device data.  Last Pain:  Vitals:   09/26/22 0905  TempSrc:   PainSc: 0-No pain         Complications: No notable events documented.

## 2022-09-26 NOTE — Anesthesia Procedure Notes (Signed)
Anesthesia Regional Block: Ankle block   Pre-Anesthetic Checklist: , timeout performed,  Correct Patient, Correct Site, Correct Laterality,  Correct Procedure, Correct Position, site marked,  Risks and benefits discussed,  Pre-op evaluation,  At surgeon's request and post-op pain management  Laterality: Left  Prep: Maximum Sterile Barrier Precautions used, chloraprep       Needles:  Injection technique: Single-shot  Needle Type: Echogenic Needle     Needle Length: 4cm  Needle Gauge: 25     Additional Needles:   Narrative:  Start time: 09/26/2022 8:55 AM End time: 09/26/2022 9:00 AM  Performed by: Personally  Anesthesiologist: Kaylyn Layer, MD  Additional Notes: Risks, benefits, and alternative discussed. Patient gave consent for procedure. Patient prepped and draped in sterile fashion. Sedation administered, patient remains easily responsive to voice. Local anesthetic given in 5cc increments with no signs or symptoms of intravascular injection. No pain or paraesthesias with injection. Patient monitored throughout procedure with signs of LAST or immediate complications. Tolerated well.   Steven Greenhouse, MD

## 2022-09-26 NOTE — Progress Notes (Signed)
Orthopedic Tech Progress Note Patient Details:  Steven Cochran [birth year] [id number]  Ortho Devices Type of Ortho Device: Postop shoe/boot Ortho Device/Splint Location: LLE Ortho Device/Splint Interventions: Application   Post Interventions Patient Tolerated: Well Instructions Provided: Adjustment of device  [name] E [name] 09/26/2022, 10:48 AM

## 2022-09-26 NOTE — Progress Notes (Signed)
OT Cancellation Note  Patient Details Name: Steven Cochran MRN: [id number] DOB: [birth year]   Cancelled Treatment:    Reason Eval/Treat Not Completed: Patient at procedure or test/ unavailable Pt with plan for surgery for left foot fifth ray amputation. OT will return for evaluation as pt is appropriate and time allows.   Rebeca Alert 09/26/2022, 8:23 AM

## 2022-09-26 NOTE — Anesthesia Postprocedure Evaluation (Signed)
Anesthesia Post Note  Patient: Steven Cochran  Procedure(s) Performed: LEFT FOOT 5TH RAY AMPUTATION (Left)     Patient location during evaluation: PACU Anesthesia Type: Regional Level of consciousness: awake and alert Pain management: pain level controlled Vital Signs Assessment: post-procedure vital signs reviewed and stable Respiratory status: spontaneous breathing and respiratory function stable Cardiovascular status: stable Postop Assessment: no apparent nausea or vomiting Anesthetic complications: no   No notable events documented.  Last Vitals:  Vitals:   09/26/22 1043 09/26/22 1105  BP: (!) 147/95 (!) 153/94  Pulse: 69 69  Resp: 10 17  Temp:  36.5 C  SpO2: 100% 99%    Last Pain:  Vitals:   09/26/22 1105  TempSrc: Oral  PainSc:                  [name]

## 2022-09-26 NOTE — Op Note (Signed)
09/26/2022  10:14 AM  PATIENT:  Steven Cochran    PRE-OPERATIVE DIAGNOSIS:  Osteomyelitis Left 5th Toe  POST-OPERATIVE DIAGNOSIS:  Same  PROCEDURE:  LEFT FOOT 5TH RAY AMPUTATION Local tissue rearrangement for wound closure 4 x 10 cm. Application Kerecis micro graft 38 cm. Application of 13 cm Prevena wound VAC.  SURGEON:  Nadara Mustard, MD  PHYSICIAN ASSISTANT:None ANESTHESIA:   General  PREOPERATIVE INDICATIONS:  Steven Cochran is a  52 y.o. male with a diagnosis of Osteomyelitis Left 5th Toe who failed conservative measures and elected for surgical management.    The risks benefits and alternatives were discussed with the patient preoperatively including but not limited to the risks of infection, bleeding, nerve injury, cardiopulmonary complications, the need for revision surgery, among others, and the patient was willing to proceed.  OPERATIVE IMPLANTS:   Implant Name Type Inv. Item Serial No. Manufacturer Lot No. LRB No. Used Action  GRAFT SKIN WND MICRO 38 - [id number] Tissue GRAFT SKIN WND MICRO 38  [employer] [id number] Left 1 Implanted    @ENCIMAGES @  OPERATIVE FINDINGS: Patient had good petechial bleeding the wound margins were clear.  OPERATIVE PROCEDURE: Patient was brought the operating room after undergoing a ankle block.  After adequate levels anesthesia were obtained patient's left lower extremity was prepped using DuraPrep draped into a sterile field a timeout was called.  A extensile incision was made laterally to encompass the ulcerative tissue.  The ray and ulcerative tissue were resected in 1 block of tissue through the base of the fifth metatarsal.  Electrocautery was used for hemostasis the wound was irrigated normal saline.  The wound was 10 x 4 cm after excision of the ulcer.  The wound was filled with 38 cm of Kerecis micro graft to cover a wound surface area of 40 cm.  Local tissue rearrangement was then performed after undermining the wound edges to  close the wound 4 x 10 cm with 2-0 nylon.  The Prevena 13 cm wound VAC was applied this had a good suction fit patient was taken the PACU in stable condition.   DISCHARGE PLANNING:  Antibiotic duration: Continue antibiotics for 24 hours  Weightbearing: Touchdown weightbearing on the left  Pain medication: Opioid pathway  Dressing care/ Wound VAC: Continue wound VAC for 1 week after discharge  Ambulatory devices: Walker or crutches  Discharge to: Anticipate discharge to home  Follow-up: In the office 1 week post operative.

## 2022-09-26 NOTE — Evaluation (Signed)
Physical Therapy Evaluation Patient Details Name: Steven Cochran MRN: [id number] DOB: 01-Apr-1971 Today's Date: 09/26/2022  History of Present Illness  52 y.o. male presents to [facility] hospital on 09/18/2022 with L foot wound. Pt underwent L 5th ray amputation on 5/22. PMH includes HFrEF, CVA, HTN, DMII, neuropathy, HLF, anemia, blindness L eye.  Clinical Impression  Pt presents to PT with deficits in gait, balance, strength, endurance. Upon PT arrival pt reports he has already ambulated within the room without DME. Pt likely not maintaining WB precautions when doing this a sPT provides multiple cues during session to reduce WB through L foot. Pt is able to transfer well with limited pressure through L foot. Pt performs heel strike well when ambulating but often rolls through to toes, questionable whether pt is maintaining TDWB at this time. Pt declines stair training. Pt will need to be able to negotiate stairs well as he has 3 flights to manage to enter his apartment. PT is hopeful the pt will progress and handle stair negotiation well tomorrow. PT recommends discharge home with HHPT.       Recommendations for follow up therapy are one component of a multi-disciplinary discharge planning process, led by the attending physician.  Recommendations may be updated based on patient status, additional functional criteria and insurance authorization.  Follow Up Recommendations       Assistance Recommended at Discharge PRN  Patient can return home with the following  Help with stairs or ramp for entrance;Assist for transportation;A little help with bathing/dressing/bathroom    Equipment Recommendations Crutches  Recommendations for Other Services       Functional Status Assessment Patient has had a recent decline in their functional status and demonstrates the ability to make significant improvements in function in a reasonable and predictable amount of time.     Precautions / Restrictions  Precautions Precautions: Fall Precaution Comments: wound vac L foot Required Braces or Orthoses: Other Brace Other Brace: post-op shoe present, no orders noted in chart Restrictions Weight Bearing Restrictions: Yes LLE Weight Bearing: Touchdown weight bearing      Mobility  Bed Mobility               General bed mobility comments: received and left sitting at edge of bed    Transfers Overall transfer level: Needs assistance Equipment used: Crutches Transfers: Sit to/from Stand Sit to Stand: Supervision           General transfer comment: demonstration for technique provided prior to transfer    Ambulation/Gait Ambulation/Gait assistance: Min guard Gait Distance (Feet): 50 Feet Assistive device: Crutches Gait Pattern/deviations: Step-to pattern Gait velocity: reduced Gait velocity interpretation: <1.31 ft/sec, indicative of household ambulator   General Gait Details: pt with slowed step-to gait, PT provides cues to reduce pressure through L foot and emphasize heel strike with forefoot off ground to reduce WB through area of wound. Pt is able to land with heel strike, unable to maintain DF throughout gait cycle  Stairs Stairs:  (pt declines stair training at this time despite PT encouragement)          Wheelchair Mobility    Modified Rankin (Stroke Patients Only)       Balance Overall balance assessment: Needs assistance Sitting-balance support: No upper extremity supported, Feet supported Sitting balance-Leahy Scale: Good     Standing balance support: Bilateral upper extremity supported, Reliant on assistive device for balance Standing balance-Leahy Scale: Poor  Pertinent Vitals/Pain Pain Assessment Pain Assessment: Faces Faces Pain Scale: Hurts little more Pain Location: L foot Pain Descriptors / Indicators: Sore Pain Intervention(s): Monitored during session    Home Living Family/patient expects  to be discharged to:: Private residence Living Arrangements: Alone Available Help at Discharge: Family (11 y.o. son PRN) Type of Home: Apartment Home Access: Stairs to enter Entrance Stairs-Rails: Can reach both Entrance Stairs-Number of Steps: 3 flights   Home Layout: One level Home Equipment: Cane - single point      Prior Function Prior Level of Function : Independent/Modified Independent             Mobility Comments: utilizes a SPC for community mobility       Hand Dominance        Extremity/Trunk Assessment   Upper Extremity Assessment Upper Extremity Assessment: Overall WFL for tasks assessed    Lower Extremity Assessment Lower Extremity Assessment: LLE deficits/detail LLE Deficits / Details: LLE DF appears to be limited, possibly related due dressing and wound vac presence    Cervical / Trunk Assessment Cervical / Trunk Assessment: Normal  Communication   Communication: No difficulties  Cognition Arousal/Alertness: Awake/alert Behavior During Therapy: WFL for tasks assessed/performed Overall Cognitive Status: Within Functional Limits for tasks assessed                                          General Comments General comments (skin integrity, edema, etc.): VSS on RA, wound vac intact    Exercises     Assessment/Plan    PT Assessment Patient needs continued PT services  PT Problem List Decreased strength;Decreased activity tolerance;Decreased balance;Decreased mobility;Decreased knowledge of precautions;Pain       PT Treatment Interventions DME instruction;Gait training;Stair training;Therapeutic activities;Functional mobility training;Therapeutic exercise;Balance training;Neuromuscular re-education;Patient/family education    PT Goals (Current goals can be found in the Care Plan section)  Acute Rehab PT Goals Patient Stated Goal: to return home PT Goal Formulation: With patient Time For Goal Achievement: 10/10/22 Potential  to Achieve Goals: Fair    Frequency Min 5X/week     Co-evaluation               AM-PAC PT "6 Clicks" Mobility  Outcome Measure Help needed turning from your back to your side while in a flat bed without using bedrails?: None Help needed moving from lying on your back to sitting on the side of a flat bed without using bedrails?: None Help needed moving to and from a bed to a chair (including a wheelchair)?: A Little Help needed standing up from a chair using your arms (e.g., wheelchair or bedside chair)?: A Little Help needed to walk in hospital room?: A Little Help needed climbing 3-5 steps with a railing? : A Lot 6 Click Score: 19    End of Session   Activity Tolerance: Patient tolerated treatment well Patient left: in bed;with call bell/phone within reach Nurse Communication: Mobility status PT Visit Diagnosis: Other abnormalities of gait and mobility (R26.89);Muscle weakness (generalized) (M62.81);Pain Pain - Right/Left: Left Pain - part of body: Ankle and joints of foot    Time: 1552-1610 PT Time Calculation (min) (ACUTE ONLY): 18 min   Charges:   PT Evaluation $PT Eval Low Complexity: 1 Low          Arlyss Gandy, PT, DPT Acute Rehabilitation Office [phone]   Arlyss Gandy 09/26/2022, 4:33 PM

## 2022-09-27 ENCOUNTER — Other Ambulatory Visit: Payer: Self-pay | Admitting: Internal Medicine

## 2022-09-27 ENCOUNTER — Encounter (HOSPITAL_COMMUNITY): Payer: Self-pay | Admitting: Orthopedic Surgery

## 2022-09-27 ENCOUNTER — Encounter: Payer: Medicare HMO | Admitting: Gastroenterology

## 2022-09-27 ENCOUNTER — Telehealth: Payer: Self-pay | Admitting: Gastroenterology

## 2022-09-27 DIAGNOSIS — Z299 Encounter for prophylactic measures, unspecified: Secondary | ICD-10-CM | POA: Diagnosis not present

## 2022-09-27 DIAGNOSIS — E1149 Type 2 diabetes mellitus with other diabetic neurological complication: Secondary | ICD-10-CM | POA: Diagnosis not present

## 2022-09-27 DIAGNOSIS — M86172 Other acute osteomyelitis, left ankle and foot: Secondary | ICD-10-CM | POA: Diagnosis not present

## 2022-09-27 DIAGNOSIS — E11621 Type 2 diabetes mellitus with foot ulcer: Secondary | ICD-10-CM | POA: Diagnosis not present

## 2022-09-27 DIAGNOSIS — L97529 Non-pressure chronic ulcer of other part of left foot with unspecified severity: Secondary | ICD-10-CM | POA: Diagnosis not present

## 2022-09-27 DIAGNOSIS — I639 Cerebral infarction, unspecified: Secondary | ICD-10-CM | POA: Diagnosis not present

## 2022-09-27 DIAGNOSIS — H401190 Primary open-angle glaucoma, unspecified eye, stage unspecified: Secondary | ICD-10-CM | POA: Diagnosis not present

## 2022-09-27 DIAGNOSIS — D509 Iron deficiency anemia, unspecified: Secondary | ICD-10-CM | POA: Diagnosis not present

## 2022-09-27 DIAGNOSIS — I10 Essential (primary) hypertension: Secondary | ICD-10-CM | POA: Diagnosis not present

## 2022-09-27 DIAGNOSIS — M869 Osteomyelitis, unspecified: Secondary | ICD-10-CM | POA: Diagnosis not present

## 2022-09-27 DIAGNOSIS — E1169 Type 2 diabetes mellitus with other specified complication: Secondary | ICD-10-CM | POA: Diagnosis not present

## 2022-09-27 LAB — BASIC METABOLIC PANEL
Anion gap: 5 (ref 5–15)
BUN: 20 mg/dL (ref 6–20)
CO2: 26 mmol/L (ref 22–32)
Calcium: 8.6 mg/dL — ABNORMAL LOW (ref 8.9–10.3)
Chloride: 104 mmol/L (ref 98–111)
Creatinine, Ser: 1.27 mg/dL — ABNORMAL HIGH (ref 0.61–1.24)
GFR, Estimated: >60 mL/min (ref >60)
Glucose, Bld: 119 mg/dL — ABNORMAL HIGH (ref 70–99)
Potassium: 4 mmol/L (ref 3.5–5.1)
Sodium: 135 mmol/L (ref 135–145)

## 2022-09-27 LAB — CBC
HCT: 28.3 % — ABNORMAL LOW (ref 39.0–52.0)
Hemoglobin: 8.8 g/dL — ABNORMAL LOW (ref 13.0–17.0)
MCH: 21.7 pg — ABNORMAL LOW (ref 26.0–34.0)
MCHC: 31.1 g/dL (ref 30.0–36.0)
MCV: 69.7 fL — ABNORMAL LOW (ref 80.0–100.0)
Platelets: 387 10*3/uL (ref 150–400)
RBC: 4.06 MIL/uL — ABNORMAL LOW (ref 4.22–5.81)
RDW: 14.6 % (ref 11.5–15.5)
WBC: 7.9 10*3/uL (ref 4.0–10.5)
nRBC: 0 % (ref 0.0–0.2)

## 2022-09-27 LAB — GLUCOSE, CAPILLARY
Glucose-Capillary: 113 mg/dL — ABNORMAL HIGH (ref 70–99)
Glucose-Capillary: 179 mg/dL — ABNORMAL HIGH (ref 70–99)
Glucose-Capillary: 103 mg/dL — ABNORMAL HIGH (ref 70–99)
Glucose-Capillary: 149 mg/dL — ABNORMAL HIGH (ref 70–99)

## 2022-09-27 MED ORDER — ENOXAPARIN SODIUM 40 MG/0.4ML IJ SOSY
40.0000 mg | PREFILLED_SYRINGE | INTRAMUSCULAR | Status: DC
  Administered 2022-09-27 – 2022-09-28 (×2): 40 mg via SUBCUTANEOUS
  Filled 2022-09-27 (×2): qty 0.4

## 2022-09-27 NOTE — Progress Notes (Signed)
Patient ID: Steven Cochran, male   DOB: 08-05-1970, 52 y.o.   MRN: [id number] Patient is postoperative day 1 left foot fifth ray amputation.  There is 25 cc in the wound VAC canister.  Minimize weightbearing on the left lower extremity.  Patient will discharge with the Praveena plus portable wound VAC pump.

## 2022-09-27 NOTE — TOC Progression Note (Signed)
Transition of Care [facility]) - Progression Note    Patient Details  Name: Steven Cochran MRN: [id number] Date of Birth: 09-27-1970  Transition of Care [facility]) CM/SW Contact  Gordy Clement, RN Phone Number: 09/27/2022, 4:13 PM  Clinical Narrative:     Now patient being recommended a tub bench  CM has ordered and [employer] will deliver to room     Expected Discharge Plan: Rest Home (works part-time @ [employer]) Barriers to Discharge: Continued Medical Work up  Expected Discharge Plan and Services   Discharge Planning Services: CM Consult   Living arrangements for the past 2 months: Apartment (resides on the 3rd floor)                                       Social Determinants of Health (SDOH) Interventions SDOH Screenings   Food Insecurity: No Food Insecurity (07/12/2022)  Housing: Low Risk  (07/12/2022)  Transportation Needs: Unmet Transportation Needs (07/12/2022)  Utilities: Not At Risk (07/12/2022)  Alcohol Screen: Low Risk  (07/12/2022)  Depression (PHQ2-9): Low Risk  (07/12/2022)  Financial Resource Strain: Low Risk  (07/12/2022)  Physical Activity: Sufficiently Active (07/12/2022)  Social Connections: Socially Isolated (07/12/2022)  Stress: No Stress Concern Present (07/12/2022)  Tobacco Use: Low Risk  (09/26/2022)    Readmission Risk Interventions     No data to display

## 2022-09-27 NOTE — Progress Notes (Signed)
Orthopedic Tech Progress Note Patient Details:  Steven Cochran Oct 25, 1970 [id number]  Crutches delivered and adjusted to pt room. Pt sates he has used crutches before and is familiar with how to ambulate properly.  Ortho Devices Type of Ortho Device: Crutches Ortho Device/Splint Location: at bedside Ortho Device/Splint Interventions: Ordered, Application, Adjustment   Post Interventions Patient Tolerated: Well Instructions Provided: Poper ambulation with device, Care of device, Adjustment of device  Annastyn Silvey Carmine Savoy 09/27/2022, 1:17 PM

## 2022-09-27 NOTE — Evaluation (Signed)
Occupational Therapy Evaluation Patient Details Name: Steven Cochran MRN: [id number] DOB: Dec 03, 1970 Today's Date: 09/27/2022   History of Present Illness 52 y.o. male presents to [facility] hospital on 09/18/2022 with L foot wound. Pt underwent L 5th ray amputation on 5/22. PMH includes HFrEF, CVA, HTN, DMII, neuropathy, HLF, anemia, blindness L eye.   Clinical Impression   Pt admitted for above dx, PTA patient lived with 15yo son and reports ambulating with SPC but being independent in bADLs/iADLs. Pt currently presenting with impaired balance and needs occassional cueing to maintain LLE TDWB status. Educated pt on the setup of home wound vac, but would benefit from continued reinforcement. Attempted two tub transfers, pt much safer and more stable with use of tub bench and patient in agreeance for need for tub bench. Pt would benefit from continue acute skilled OT services to address above/below deficits and help transition to next level of care. Patient would benefit from post acute Home OT services to help maximize functional independence in natural environment       Recommendations for follow up therapy are one component of a multi-disciplinary discharge planning process, led by the attending physician.  Recommendations may be updated based on patient status, additional functional criteria and insurance authorization.   Assistance Recommended at Discharge Frequent or constant Supervision/Assistance  Patient can return home with the following A little help with walking and/or transfers;A little help with bathing/dressing/bathroom;Assistance with cooking/housework;Assist for transportation;Help with stairs or ramp for entrance    Functional Status Assessment  Patient has had a recent decline in their functional status and demonstrates the ability to make significant improvements in function in a reasonable and predictable amount of time.  Equipment Recommendations  Tub/shower bench     Recommendations for Other Services       Precautions / Restrictions Precautions Precautions: Fall Precaution Comments: wound vac L foot Required Braces or Orthoses: Other Brace Other Brace: post-op shoe present, no orders noted in chart Restrictions Weight Bearing Restrictions: Yes LLE Weight Bearing: Touchdown weight bearing      Mobility Bed Mobility Overal bed mobility: Needs Assistance Bed Mobility: Supine to Sit     Supine to sit: Supervision     General bed mobility comments: pt left sitting EOB    Transfers Overall transfer level: Needs assistance Equipment used: Rolling walker (2 wheels) Transfers: Sit to/from Stand Sit to Stand: Min guard           General transfer comment: Cues for TDWB      Balance Overall balance assessment: Needs assistance Sitting-balance support: No upper extremity supported, Feet supported Sitting balance-Leahy Scale: Good     Standing balance support: Bilateral upper extremity supported, Reliant on assistive device for balance Standing balance-Leahy Scale: Poor                             ADL either performed or assessed with clinical judgement   ADL Overall ADL's : Needs assistance/impaired Eating/Feeding: Independent;Sitting   Grooming: Standing;Min guard   Upper Body Bathing: Sitting;Set up   Lower Body Bathing: Sitting/lateral leans;Min guard   Upper Body Dressing : Sitting;Set up   Lower Body Dressing: Sitting/lateral leans;Minimal assistance Lower Body Dressing Details (indicate cue type and reason): Min A to don/doff LLE boot, assistance with find hole of one loop Toilet Transfer: Ambulation;Min guard       Tub/ Shower Transfer: Ambulation;Tub bench;Rolling walker (2 wheels);Min guard Tub/Shower Transfer Details (indicate cue type and reason): Cues  to maintain WB precautions, Min to Mod A to maintain balance without Tub bench Functional mobility during ADLs: Min guard;Rolling walker (2  wheels)       Vision         Perception     Praxis      Pertinent Vitals/Pain Pain Assessment Pain Assessment: No/denies pain     Hand Dominance Right   Extremity/Trunk Assessment Upper Extremity Assessment Upper Extremity Assessment: Overall WFL for tasks assessed   Lower Extremity Assessment Lower Extremity Assessment: Defer to PT evaluation   Cervical / Trunk Assessment Cervical / Trunk Assessment: Normal   Communication Communication Communication: No difficulties   Cognition Arousal/Alertness: Awake/alert Behavior During Therapy: WFL for tasks assessed/performed Overall Cognitive Status: Within Functional Limits for tasks assessed                                       General Comments  VSS on RA    Exercises     Shoulder Instructions      Home Living Family/patient expects to be discharged to:: Private residence Living Arrangements: Alone Available Help at Discharge: Family (64 y.o. son PRN) Type of Home: Apartment Home Access: Stairs to enter Secretary/administrator of Steps: 3 flights Entrance Stairs-Rails: Can reach both Home Layout: One level     Bathroom Shower/Tub: Chief Strategy Officer: Standard Bathroom Accessibility: Yes How Accessible: Accessible via walker Home Equipment: Cane - single point;Shower seat   Additional Comments: works part time at [employer], on disability. Denies any falls      Prior Functioning/Environment Prior Level of Function : Independent/Modified Independent;Driving;Working/employed             Mobility Comments: utilizes a [employer] for community mobility ADLs Comments: independent in bADLs/iADLs        OT Problem List: Impaired balance (sitting and/or standing);Pain      OT Treatment/Interventions: Self-care/ADL training;Therapeutic activities;Therapeutic exercise;Balance training;DME and/or AE instruction;Patient/family education    OT Goals(Current goals can be found in the  care plan section) Acute Rehab OT Goals Patient Stated Goal: to go home OT Goal Formulation: With patient Time For Goal Achievement: 10/11/22 Potential to Achieve Goals: Good  OT Frequency: Min 3X/week    Co-evaluation              AM-PAC OT "6 Clicks" Daily Activity     Outcome Measure Help from another person eating meals?: None Help from another person taking care of personal grooming?: A Little Help from another person toileting, which includes using toliet, bedpan, or urinal?: A Little (with tub bench) Help from another person bathing (including washing, rinsing, drying)?: A Little Help from another person to put on and taking off regular upper body clothing?: A Little Help from another person to put on and taking off regular lower body clothing?: A Little 6 Click Score: 19   End of Session Equipment Utilized During Treatment: Gait belt;Rolling walker (2 wheels) Nurse Communication: Mobility status;Other (comment) (Ready to get vitals, pt sitting EOB)  Activity Tolerance: Patient tolerated treatment well Patient left: in bed;with call bell/phone within reach  OT Visit Diagnosis: Unsteadiness on feet (R26.81);Pain Pain - Right/Left: Left Pain - part of body: Ankle and joints of foot                Time: 1400-1441 OT Time Calculation (min): 41 min Charges:  OT General Charges $OT Visit: 1 Visit OT  Evaluation $OT Eval Moderate Complexity: 1 Mod OT Treatments $Therapeutic Activity: 23-37 mins  09/27/2022  AB, OTR/L  Acute Rehabilitation Services  Office: [phone]   Tristan Schroeder 09/27/2022, 2:56 PM

## 2022-09-27 NOTE — Telephone Encounter (Signed)
Good Afternoon Dr. Tomasa Rand,  I called and left a message for this patient.  I did check his chart and was at the hospital for his left foot.  See patients chart for further explanation.

## 2022-09-27 NOTE — Progress Notes (Signed)
Subjective:  No significant overnight events. Patient doing well this morning. He reports ambulating to the bathroom without difficulty. He denies any balance or coordination issues. He denies experiencing any pain in his left foot. Discussed plan for discharge when safe from a PT/OT standpoint.  Objective:  Vital signs in last 24 hours: Vitals:   09/26/22 1105 09/26/22 2008 09/27/22 0510 09/27/22 0732  BP: (!) 153/94 (!) 153/90 129/80 (!) 141/81  Pulse: 69 71 90 84  Resp: 17 18 18 19   Temp: 97.7 F (36.5 C) 98.3 F (36.8 C) 98.1 F (36.7 C) 98.3 F (36.8 C)  TempSrc: Oral Oral Oral Oral  SpO2: 99% 99% 97% 98%  Weight:      Height:       Physical Exam:  Constitutional: Appears stated age. Sitting on edge of bed. Appears comfortable and in no acute distress. Cardiovascular: Regular rate, regular rhythm. No murmurs, rubs, or gallops. Extremities warm and well-perfused.  Pulmonary: Normal respiratory effort. No wheezes, rales, rhonchi, or crackles.   Abdominal: Soft. Non-distended.     MSK: LLE with trace edema around ankle > RLE with no edema. No asymmetric erythema or calf tenderness. Neurological: Sensation diminished in left foot. Skin: Warm and dry. Wound vac in place on left foot with coban wrapped to mid-calf.  Assessment/Plan:  Principal Problem:   Osteomyelitis of fifth toe of left foot (HCC) Active Problems:   HTN (hypertension)   Embolic stroke (HCC)   Type II diabetes mellitus with neurological manifestations (HCC)   Chronic systolic CHF (congestive heart failure) (HCC)   Hyperlipidemia associated with type 2 diabetes mellitus (HCC)   Microcytic anemia  Steven Cochran is a 52 y.o. person living with a history of HFrEF, stroke, hypertension, type 2 diabetes with neuropathy, hyperlipidemia, microcytic anemia, and left eye blindness who presented with left foot infection and admitted for osteomyelitis on HD#9.   # Left fifth metatarsal and proximal phalanx  osteomyelitis # Left fifth metatarsophalangeal joint septic arthritis # Diabetic foot infection Patient denies experiencing any left foot pain. Underwent left fifth ray amputation with orthopedics yesterday (5/22). Wound margins were clear. Broad-spectrum antibiotics discontinued with no indication for further antibiotic therapy at this time. Wound vac will remain in place until 1-week follow-up with Dr. Lajoyce Corners outpatient. Patient had difficulty with PT session this morning due to fatigue and weightbearing precautions. PT recommending patient remains hospitalized until better progress toward functional mobility goals. Will discharge when safe from a PT/OT standpoint. - Appreciate orthopedic surgery recommendations - Appreciate PT/OT recommendations - Trend CBC   # Diabetes, type II with neurologic manifestations  CBGs well-controlled. Will continue Semglee 10 units, SSI, and Jardiance 25 mg daily. - Semglee 10 units - SSI - Jardiance 25 mg daily   # Chronic HFmrEF (EF 45-50%)  # Hypertension Patient remains mildly hypertensive with MAPs in 90s. Will continue home antihypertensives while hospitalized. Recommend starting spironolactone at discharge to follow GDMT and better manage blood pressure. - Amlodipine 5 mg daily - Carvedilol 25 mg twice daily - Entresto 97-103 mg twice daily - Daily weights - Strict I's and O's   # AKI on CKD stage 3a, resolved AKI resolved with creatinine back to baseline at 1.27 today. Will continue monitoring renal function given recent surgery. -trend BMP   # Previous CVA with no residual deficits # Hyperlipidemia Held aspirin on admission due to potential for surgical intervention. VTE prophylaxis continued throughout hospitalization but held starting 5/21 for surgery. Will resume Lovenox today (5/23) since  it will be 24 hours after surgery. Will continue Crestor 40 mg daily. - Hold aspirin - Resume Lovenox - Crestor 40 mg  Diet: HH/CM Bowel: Senna VTE:  Lovenox IVF: none Code: Full LOS: day 9   Dispo: Anticipated discharge when safe from a PT/OT standpoint  Steven Cochran, Medical Student 09/27/2022, 11:34 AM

## 2022-09-27 NOTE — Progress Notes (Signed)
Physical Therapy Treatment Patient Details Name: Steven Cochran MRN: [id number] DOB: 16-Jan-1971 Today's Date: 09/27/2022   History of Present Illness 52 y.o. male presents to [facility] hospital on 09/18/2022 with L foot wound. Pt underwent L 5th ray amputation on 5/22. PMH includes HFrEF, CVA, HTN, DMII, neuropathy, HLF, anemia, blindness L eye.    PT Comments    Pt received in supine and agreeable to session. Pt limited by increased fatigue this session. Pt requiring dense cues for technique and WB precautions throughout session with pt only briefly improving. Pt reporting ambulation to the bathroom without AD independently during admission despite WB precautions. Pt educated on the importance of maintaining the precautions to protect the wound. Pt able to perform short gait trial in room before needing a seated rest break. Pt requesting to use the bathroom and required a second rest break before completing gait distance. Pt initially using crutches and demonstrating increased instability and difficulty offloading with BUE to maintain LLE TDWB.  Pt then using RW with increased stability, however continued to fatigue quickly. Pt able to keep LLE forward with heel strike for most of the gait trials, however appeared to demonstrate increased WB with increased fatigue. Deferred stair trial this session due to increased fatigue. Pt continues to benefit from PT services to progress toward functional mobility goals.     Recommendations for follow up therapy are one component of a multi-disciplinary discharge planning process, led by the attending physician.  Recommendations may be updated based on patient status, additional functional criteria and insurance authorization.     Assistance Recommended at Discharge PRN  Patient can return home with the following Help with stairs or ramp for entrance;Assist for transportation;A little help with bathing/dressing/bathroom   Equipment Recommendations  Rolling walker (2  wheels) (tall RW)    Recommendations for Other Services       Precautions / Restrictions Precautions Precautions: Fall Precaution Comments: wound vac L foot Required Braces or Orthoses: Other Brace Other Brace: post-op shoe present, no orders noted in chart Restrictions Weight Bearing Restrictions: Yes LLE Weight Bearing: Touchdown weight bearing     Mobility  Bed Mobility Overal bed mobility: Needs Assistance Bed Mobility: Supine to Sit     Supine to sit: Supervision     General bed mobility comments: Pt requiring cues not to WB through LLE to scoot forward at EOB    Transfers Overall transfer level: Needs assistance Equipment used: Crutches, Rolling walker (2 wheels) Transfers: Sit to/from Stand Sit to Stand: Min guard           General transfer comment: Pt demonstrating unsteadiness and requiring cues for technique with crutches.    Ambulation/Gait Ambulation/Gait assistance: Min guard Gait Distance (Feet): 20 Feet (+10, +8, +10) Assistive device: Crutches, Rolling walker (2 wheels) Gait Pattern/deviations: Step-to pattern Gait velocity: reduced     General Gait Details: Pt initially using crutches requiring dense cues for proximity and technique with pt briefly improving. Pt then using RW for increased support with improved stability. Pt becoming fatigued and requiring 2 seated rest breaks during gait trial. Pt unable to maintain LLE TDWB during trial and appearing to put increased weight through L foot with increased fatigue despite cues.     Balance Overall balance assessment: Needs assistance Sitting-balance support: No upper extremity supported, Feet supported Sitting balance-Leahy Scale: Good Sitting balance - Comments: sitting EOB   Standing balance support: Bilateral upper extremity supported, Reliant on assistive device for balance Standing balance-Leahy Scale: Poor Standing balance comment:  with crutches and RW support                             Cognition Arousal/Alertness: Awake/alert Behavior During Therapy: WFL for tasks assessed/performed Overall Cognitive Status: Within Functional Limits for tasks assessed                                          Exercises      General Comments        Pertinent Vitals/Pain Pain Assessment Pain Assessment: No/denies pain     PT Goals (current goals can now be found in the care plan section) Acute Rehab PT Goals Patient Stated Goal: to return home PT Goal Formulation: With patient Time For Goal Achievement: 10/10/22 Potential to Achieve Goals: Fair Progress towards PT goals: Not progressing toward goals - comment (limited by fatigue)    Frequency    Min 5X/week      PT Plan Equipment recommendations need to be updated       AM-PAC PT "6 Clicks" Mobility   Outcome Measure  Help needed turning from your back to your side while in a flat bed without using bedrails?: None Help needed moving from lying on your back to sitting on the side of a flat bed without using bedrails?: A Little Help needed moving to and from a bed to a chair (including a wheelchair)?: A Little Help needed standing up from a chair using your arms (e.g., wheelchair or bedside chair)?: A Little Help needed to walk in hospital room?: A Little Help needed climbing 3-5 steps with a railing? : A Lot 6 Click Score: 18    End of Session Equipment Utilized During Treatment: Gait belt Activity Tolerance: Patient tolerated treatment well Patient left:  (in the bathroom with instructions to pull the cord when finished and nursing notified) Nurse Communication: Mobility status PT Visit Diagnosis: Other abnormalities of gait and mobility (R26.89);Muscle weakness (generalized) (M62.81);Pain     Time: 1610-9604 PT Time Calculation (min) (ACUTE ONLY): 52 min  Charges:  $Gait Training: 23-37 mins $Therapeutic Activity: 8-22 mins                     Johny Shock, PTA Acute  Rehabilitation Services Secure Chat Preferred  [phone]    Johny Shock 09/27/2022, 11:34 AM

## 2022-09-27 NOTE — TOC Progression Note (Addendum)
Transition of Care [facility]) - Progression Note    Patient Details  Name: Steven Cochran MRN: [id number] Date of Birth: 1970/08/03  Transition of Care [facility]) CM/SW Contact  Gordy Clement, RN Phone Number: 09/27/2022, 8:58 AM  Clinical Narrative:    Patient being recommended HHPT and crutches.  [facility] will provide home physical therapy. (Choice per ratings) [employer] will deliver crutches bedside prior to DC.   TOC will continue to follow for additional DC needs  Cab fare may be needed if Patient cannot reach anyone to pick him at DC  UPDATE 11:41 AM  PT reports that patient will need a rolling walker instead of the crutches that were delivered. [employer] has been notified and will make the exchange     Expected Discharge Plan: Rest Home (works part-time @ [employer]) Barriers to Discharge: Continued Medical Work up  [facility]   Discharge Planning Services: CM Consult   Living arrangements for the past 2 months: Apartment (resides on the 3rd floor)                                       Social Determinants of Health (SDOH) Interventions SDOH Screenings   Food Insecurity: No Food Insecurity (07/12/2022)  Housing: Low Risk  (07/12/2022)  Transportation Needs: Unmet Transportation Needs (07/12/2022)  Utilities: Not At Risk (07/12/2022)  Alcohol Screen: Low Risk  (07/12/2022)  Depression (PHQ2-9): Low Risk  (07/12/2022)  Financial Resource Strain: Low Risk  (07/12/2022)  Physical Activity: Sufficiently Active (07/12/2022)  Social Connections: Socially Isolated (07/12/2022)  Stress: No Stress Concern Present (07/12/2022)  Tobacco Use: Low Risk  (09/26/2022)    Readmission Risk Interventions     No data to display

## 2022-09-28 ENCOUNTER — Other Ambulatory Visit (HOSPITAL_COMMUNITY): Payer: Self-pay

## 2022-09-28 DIAGNOSIS — M86172 Other acute osteomyelitis, left ankle and foot: Secondary | ICD-10-CM | POA: Diagnosis not present

## 2022-09-28 DIAGNOSIS — I10 Essential (primary) hypertension: Secondary | ICD-10-CM | POA: Diagnosis not present

## 2022-09-28 DIAGNOSIS — E1169 Type 2 diabetes mellitus with other specified complication: Secondary | ICD-10-CM | POA: Diagnosis not present

## 2022-09-28 DIAGNOSIS — E1149 Type 2 diabetes mellitus with other diabetic neurological complication: Secondary | ICD-10-CM | POA: Diagnosis not present

## 2022-09-28 LAB — GLUCOSE, CAPILLARY
Glucose-Capillary: 124 mg/dL — ABNORMAL HIGH (ref 70–99)
Glucose-Capillary: 168 mg/dL — ABNORMAL HIGH (ref 70–99)

## 2022-09-28 LAB — CBC
HCT: 27.3 % — ABNORMAL LOW (ref 39.0–52.0)
Hemoglobin: 8.3 g/dL — ABNORMAL LOW (ref 13.0–17.0)
MCH: 21.3 pg — ABNORMAL LOW (ref 26.0–34.0)
MCHC: 30.4 g/dL (ref 30.0–36.0)
MCV: 70 fL — ABNORMAL LOW (ref 80.0–100.0)
Platelets: 394 10*3/uL (ref 150–400)
RBC: 3.9 MIL/uL — ABNORMAL LOW (ref 4.22–5.81)
RDW: 14.8 % (ref 11.5–15.5)
WBC: 8.7 10*3/uL (ref 4.0–10.5)
nRBC: 0 % (ref 0.0–0.2)

## 2022-09-28 LAB — BASIC METABOLIC PANEL
Anion gap: 6 (ref 5–15)
BUN: 25 mg/dL — ABNORMAL HIGH (ref 6–20)
CO2: 25 mmol/L (ref 22–32)
Calcium: 8.6 mg/dL — ABNORMAL LOW (ref 8.9–10.3)
Chloride: 106 mmol/L (ref 98–111)
Creatinine, Ser: 1.39 mg/dL — ABNORMAL HIGH (ref 0.61–1.24)
GFR, Estimated: >60 mL/min (ref >60)
Glucose, Bld: 123 mg/dL — ABNORMAL HIGH (ref 70–99)
Potassium: 3.9 mmol/L (ref 3.5–5.1)
Sodium: 137 mmol/L (ref 135–145)

## 2022-09-28 MED ORDER — INSULIN GLARGINE 100 UNIT/ML SOLOSTAR PEN
15.0000 [IU] | PEN_INJECTOR | Freq: Every day | SUBCUTANEOUS | 1 refills | Status: DC
Start: 2022-09-28 — End: 2023-01-14
  Filled 2022-09-28: qty 6, 40d supply, fill #0

## 2022-09-28 MED ORDER — POLYETHYLENE GLYCOL 3350 17 GM/SCOOP PO POWD
17.0000 g | Freq: Every day | ORAL | 0 refills | Status: DC | PRN
  Filled 2022-09-28: qty 238, 14d supply, fill #0

## 2022-09-28 MED ORDER — INSULIN PEN NEEDLE 32G X 4 MM MISC
0 refills | Status: DC
  Filled 2022-09-28: qty 100, 100d supply, fill #0

## 2022-09-28 MED ORDER — SPIRONOLACTONE 25 MG PO TABS
12.5000 mg | ORAL_TABLET | Freq: Every day | ORAL | 11 refills | Status: DC
  Filled 2022-09-28: qty 30, 60d supply, fill #0

## 2022-09-28 NOTE — Progress Notes (Signed)
Mobility Specialist Progress Note:    09/28/22 1448  Mobility  Activity Ambulated with assistance in room  Level of Assistance Contact guard assist, steadying assist  Assistive Device Front wheel walker  Distance Ambulated (ft) 6 ft  LLE Weight Bearing TWB  Activity Response Tolerated well  Mobility Referral Yes  $Mobility charge 1 Mobility  Mobility Specialist Start Time (ACUTE ONLY) 1425  Mobility Specialist Stop Time (ACUTE ONLY) 1445  Mobility Specialist Time Calculation (min) (ACUTE ONLY) 20 min   Pt received from OT at end of session in recliner positioned in front of door way and ambulated back to bed from doorway. Pt eager to ambulate back to bed exhibiting improved hop to pattern. No c/o throughout. Pt left sitting EOB with call bell at hand.   Thompson Grayer Mobility Specialist  Please contact vis Secure Chat or  Rehab Office [phone]

## 2022-09-28 NOTE — TOC Transition Note (Addendum)
Transition of Care [facility]) - CM/SW Discharge Note   Patient Details  Name: Steven Cochran MRN: [id number] Date of Birth: [birth year]  Transition of Care [facility]) CM/SW Contact:  Gordy Clement, RN Phone Number: 09/28/2022, 10:08 AM   Clinical Narrative:   Patient will DC to home with Home health .  PT and OT will be provided by [facility].  A tub bench and rolling walker have been delivered to room .  Cab voucher given to charge in case needed  No additional TOC needs identified      Barriers to Discharge: Continued Medical Work up   Patient Goals and CMS Choice      Discharge Placement                         Discharge Plan and Services Additional resources added to the After Visit Summary for     Discharge Planning Services: CM Consult                                 Social Determinants of Health (SDOH) Interventions SDOH Screenings   Food Insecurity: No Food Insecurity (07/12/2022)  Housing: Low Risk  (07/12/2022)  Transportation Needs: Unmet Transportation Needs (07/12/2022)  Utilities: Not At Risk (07/12/2022)  Alcohol Screen: Low Risk  (07/12/2022)  Depression (PHQ2-9): Low Risk  (07/12/2022)  Financial Resource Strain: Low Risk  (07/12/2022)  Physical Activity: Sufficiently Active (07/12/2022)  Social Connections: Socially Isolated (07/12/2022)  Stress: No Stress Concern Present (07/12/2022)  Tobacco Use: Low Risk  (09/27/2022)     Readmission Risk Interventions     No data to display

## 2022-09-28 NOTE — Progress Notes (Signed)
Occupational Therapy Treatment Patient Details Name: Steven Cochran MRN: [id number] DOB: [birth year] Today's Date: 09/28/2022   History of present illness 52 y.o. male presents to [facility] hospital on 09/18/2022 with L foot wound. Pt underwent L 5th ray amputation on 5/22. PMH includes HFrEF, CVA, HTN, DMII, neuropathy, HLF, anemia, blindness L eye.   OT comments  Pt continuing to progress in OT sessions, Pt experimenting with balance demonstrated ability to complete functional ambulation without placing weight on LLE. Pt demonstrating good balance with use of hop to gait and RW. Educated pt on energy conservation strategies in preparation for DC home due to decreased standing tolerance and difficulty completing bADLs/iADLs that require standing for extended periods(i.e.. cooking). Pt verbalized understanding of energy conservation strategies. OT to continue to progress pt as able, DC plans appropriate for [facility].    Recommendations for follow up therapy are one component of a multi-disciplinary discharge planning process, led by the attending physician.  Recommendations may be updated based on patient status, additional functional criteria and insurance authorization.    Assistance Recommended at Discharge Frequent or constant Supervision/Assistance  Patient can return home with the following  A little help with walking and/or transfers;A little help with bathing/dressing/bathroom;Assistance with cooking/housework;Assist for transportation;Help with stairs or ramp for entrance   Equipment Recommendations  Tub/shower bench    Recommendations for Other Services      Precautions / Restrictions Precautions Precautions: Fall Precaution Comments: wound vac L foot Required Braces or Orthoses: Other Brace Other Brace: post-op shoe present, no orders noted in chart Restrictions Weight Bearing Restrictions: Yes LLE Weight Bearing: Touchdown weight bearing       Mobility Bed Mobility                General bed mobility comments: pt rec'd and left sitting EOB    Transfers Overall transfer level: Needs assistance Equipment used: Rolling walker (2 wheels) Transfers: Sit to/from Stand Sit to Stand: Min guard                 Balance Overall balance assessment: Needs assistance Sitting-balance support: No upper extremity supported, Feet supported Sitting balance-Leahy Scale: Good Sitting balance - Comments: sitting EOB   Standing balance support: Bilateral upper extremity supported, Reliant on assistive device for balance, During functional activity Standing balance-Leahy Scale: Poor Standing balance comment: with RW support                           ADL either performed or assessed with clinical judgement   ADL                                       Functional mobility during ADLs: Min guard;Rolling walker (2 wheels) General ADL Comments: Educated patient on energy conservation techniques upon DC home due to decrease standing tolerance    Extremity/Trunk Assessment              Vision       Perception     Praxis      Cognition Arousal/Alertness: Awake/alert Behavior During Therapy: WFL for tasks assessed/performed Overall Cognitive Status: Within Functional Limits for tasks assessed                                          Exercises  Shoulder Instructions       General Comments      Pertinent Vitals/ Pain       Pain Assessment Pain Assessment: No/denies pain  Home Living                                          Prior Functioning/Environment              Frequency  Min 3X/week        Progress Toward Goals  OT Goals(current goals can now be found in the care plan section)  Progress towards OT goals: Progressing toward goals  Acute Rehab OT Goals Patient Stated Goal: to go home OT Goal Formulation: With patient Time For Goal Achievement: 10/11/22 Potential to  Achieve Goals: Good  Plan Discharge plan remains appropriate;Frequency remains appropriate    Co-evaluation                 AM-PAC OT "6 Clicks" Daily Activity     Outcome Measure   Help from another person eating meals?: None Help from another person taking care of personal grooming?: A Little Help from another person toileting, which includes using toliet, bedpan, or urinal?: A Little (with tub bench) Help from another person bathing (including washing, rinsing, drying)?: A Little Help from another person to put on and taking off regular upper body clothing?: A Little Help from another person to put on and taking off regular lower body clothing?: A Little 6 Click Score: 19    End of Session Equipment Utilized During Treatment: Gait belt;Rolling walker (2 wheels)  OT Visit Diagnosis: Unsteadiness on feet (R26.81);Pain Pain - Right/Left: Left Pain - part of body: Ankle and joints of foot   Activity Tolerance Patient tolerated treatment well   Patient Left in bed;with call bell/phone within reach   Nurse Communication Mobility status        Time: 7829-5621 OT Time Calculation (min): 17 min  Charges: OT General Charges $OT Visit: 1 Visit OT Treatments $Therapeutic Activity: 8-22 mins  09/28/2022  AB, OTR/L  Acute Rehabilitation Services  Office: [phone]   Tristan Schroeder 09/28/2022, 3:22 PM

## 2022-09-28 NOTE — Progress Notes (Signed)
Physical Therapy Treatment Patient Details Name: Steven Cochran MRN: [id number] DOB: April 03, 1971 Today's Date: 09/28/2022   History of Present Illness 52 y.o. male presents to [facility] hospital on 09/18/2022 with L foot wound. Pt underwent L 5th ray amputation on 5/22. PMH includes HFrEF, CVA, HTN, DMII, neuropathy, HLF, anemia, blindness L eye.    PT Comments    Pt received in supine and agreeable to session. Session focused on stair training due to patient having 2 flights of 19 steps to enter apartment. Pt able to seated scoot up and down stairs with supervision to maintain LLE NWB. Pt demonstrating difficulty standing from top step despite max A, however pt able to stand to second step from top and hop up 2 steps. Pt able to tolerate short gait distance attempting NWB with difficulty and then heel TDWB with cues for increased BUE offloading. Pt continues to benefit from PT services to progress toward functional mobility goals.     Recommendations for follow up therapy are one component of a multi-disciplinary discharge planning process, led by the attending physician.  Recommendations may be updated based on patient status, additional functional criteria and insurance authorization.     Assistance Recommended at Discharge PRN  Patient can return home with the following Help with stairs or ramp for entrance;Assist for transportation;A little help with bathing/dressing/bathroom   Equipment Recommendations  Rolling walker (2 wheels)    Recommendations for Other Services       Precautions / Restrictions Precautions Precautions: Fall Precaution Comments: wound vac L foot Required Braces or Orthoses: Other Brace Other Brace: post-op shoe present, no orders noted in chart Restrictions Weight Bearing Restrictions: Yes LLE Weight Bearing: Touchdown weight bearing     Mobility  Bed Mobility Overal bed mobility: Needs Assistance Bed Mobility: Supine to Sit     Supine to sit: Supervision, HOB  elevated     General bed mobility comments: pt left sitting EOB    Transfers Overall transfer level: Needs assistance Equipment used: Rolling walker (2 wheels) Transfers: Sit to/from Stand Sit to Stand: Min guard           General transfer comment: from EOB and recliner. Pt requiring mod A to stand from steps    Ambulation/Gait Ambulation/Gait assistance: Min guard Gait Distance (Feet): 5 Feet Assistive device: Rolling walker (2 wheels) Gait Pattern/deviations: Step-to pattern Gait velocity: reduced     General Gait Details: Pt attempting a hop-to pattern with L knee flexed, however pt fatiguing quickly and only able to take a few steps. Pt able to ambulate with L heel on the floor with cues for TDWB.   Stairs Stairs: Yes Stairs assistance: Supervision, Min guard Stair Management: Seated/boosting, Sideways, One rail Left Number of Stairs:  (flight) General stair comments: Pt able to scoot up stair to maintain LLE NWB. Pt unable to stand from top step, so pt standing to second from the top and hopping up the last 2 stairs with heavy BUE support      Balance Overall balance assessment: Needs assistance Sitting-balance support: No upper extremity supported, Feet supported Sitting balance-Leahy Scale: Good Sitting balance - Comments: sitting EOB   Standing balance support: Bilateral upper extremity supported, Reliant on assistive device for balance, During functional activity Standing balance-Leahy Scale: Poor Standing balance comment: with RW support                            Cognition Arousal/Alertness: Awake/alert Behavior During Therapy: [facility]  for tasks assessed/performed Overall Cognitive Status: Within Functional Limits for tasks assessed                                          Exercises      General Comments        Pertinent Vitals/Pain Pain Assessment Pain Assessment: No/denies pain     PT Goals (current goals can  now be found in the care plan section) Acute Rehab PT Goals Patient Stated Goal: to return home PT Goal Formulation: With patient Time For Goal Achievement: 10/10/22 Potential to Achieve Goals: Fair Progress towards PT goals: Progressing toward goals    Frequency    Min 5X/week      PT Plan Current plan remains appropriate       AM-PAC PT "6 Clicks" Mobility   Outcome Measure  Help needed turning from your back to your side while in a flat bed without using bedrails?: None Help needed moving from lying on your back to sitting on the side of a flat bed without using bedrails?: A Little Help needed moving to and from a bed to a chair (including a wheelchair)?: A Little Help needed standing up from a chair using your arms (e.g., wheelchair or bedside chair)?: A Little Help needed to walk in hospital room?: A Little Help needed climbing 3-5 steps with a railing? : A Little 6 Click Score: 19    End of Session Equipment Utilized During Treatment: Gait belt Activity Tolerance: Patient tolerated treatment well Patient left: in bed;with call bell/phone within reach Nurse Communication: Mobility status PT Visit Diagnosis: Other abnormalities of gait and mobility (R26.89);Muscle weakness (generalized) (M62.81);Pain Pain - Right/Left: Left Pain - part of body: Ankle and joints of foot     Time: 1327-1420 PT Time Calculation (min) (ACUTE ONLY): 53 min  Charges:  $Gait Training: 53-67 mins                     Johny Shock, PTA Acute Rehabilitation Services Secure Chat Preferred  [phone]    Johny Shock 09/28/2022, 2:35 PM

## 2022-09-28 NOTE — Plan of Care (Signed)
°  Problem: Education: °Goal: Knowledge of General Education information will improve °Description: Including pain rating scale, medication(s)/side effects and non-pharmacologic comfort measures °Outcome: Adequate for Discharge °  °Problem: Health Behavior/Discharge Planning: °Goal: Ability to manage health-related needs will improve °Outcome: Adequate for Discharge °  °Problem: Clinical Measurements: °Goal: Will remain free from infection °Outcome: Adequate for Discharge °  °Problem: Activity: °Goal: Risk for activity intolerance will decrease °Outcome: Adequate for Discharge °  °Problem: Nutrition: °Goal: Adequate nutrition will be maintained °Outcome: Adequate for Discharge °  °Problem: Elimination: °Goal: Will not experience complications related to bowel motility °Outcome: Adequate for Discharge °  °Problem: Pain Managment: °Goal: General experience of comfort will improve °Outcome: Adequate for Discharge °  °

## 2022-09-28 NOTE — Discharge Summary (Signed)
Name: Steven Cochran MRN: [id number] DOB: 07/18/1970 52 y.o. PCP: Gwenevere Abbot, MD  Date of Admission: 09/18/2022  1:30 PM Date of Discharge: 09/28/22 Attending Physician: Dr.  Lafonda Mosses  Discharge Diagnosis: Principal Problem:   Osteomyelitis of fifth toe of left foot (HCC) Active Problems:   HTN (hypertension)   Embolic stroke (HCC)   Type II diabetes mellitus with neurological manifestations (HCC)   Chronic systolic CHF (congestive heart failure) (HCC)   Hyperlipidemia associated with type 2 diabetes mellitus (HCC)   Microcytic anemia   Essential hypertension    Discharge Medications: Allergies as of 09/28/2022       Reactions   Metformin And Related Diarrhea        Medication List     TAKE these medications    amLODipine 5 MG tablet Commonly known as: Norvasc Take 1 tablet (5 mg total) by mouth daily.   aspirin EC 81 MG tablet Take 1 tablet (81 mg total) by mouth daily. Swallow whole.   BD Pen Needle Nano U/F 32G X 4 MM Misc Generic drug: Insulin Pen Needle Use with Basaglar.   carvedilol 25 MG tablet Commonly known as: COREG Take 1 tablet (25 mg total) by mouth 2 (two) times daily.   Entresto 97-103 MG Generic drug: sacubitril-valsartan Take 1 tablet by mouth 2 (two) times daily.   freestyle lancets Use a directed   FreeStyle Libre 3 Sensor Misc 1 Device by Does not apply route every 14 (fourteen) days. Place 1 sensor on the skin every 14 days. Use to check glucose continuously   FREESTYLE LITE test strip Generic drug: glucose blood Monitor blood sugar at home   FreeStyle Lite w/Device Kit Use as directed   insulin glargine 100 UNIT/ML Solostar Pen Commonly known as: LANTUS Inject 15 Units into the skin daily. What changed: how much to take   Jardiance 25 MG Tabs tablet Generic drug: empagliflozin Take 1 tablet (25 mg total) by mouth daily.   polyethylene glycol powder 17 GM/SCOOP powder Commonly known as: GLYCOLAX/MIRALAX Take 17 g by  mouth daily as needed for mild constipation. Dilute as directed on bottle.   rosuvastatin 40 MG tablet Commonly known as: CRESTOR Take 1 tablet by mouth once daily   Semaglutide(0.25 or 0.5MG /DOS) 2 MG/3ML Sopn Inject 0.5 mg into the skin once a week.   spironolactone 25 MG tablet Commonly known as: Aldactone Take 1/2 tablet (12.5 mg total) by mouth daily.               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  At the hospital follow up visit please address:  1.  Follow-up:  a.  Will be discharged with home health PT and OT.  He needs to follow-up with Dr. Lajoyce Corners in about a week.  Discharge plan is with touchdown weightbearing on left foot, specifically on heel.  He will need to use walker temporarily.    b.  We started spironolactone 12.5 at discharge to complete GDMT and because of hypertension during the admission.  Please follow-up BMP and titrate as necessary   c.  Reduced insulin at discharge to 15 units nightly.  Titrate insulin as needed.  Please help him with CGM if he has not been able to attach already.   d.Consider outpatient iron supplementation  2.   Labs / imaging needed at time of follow-up: CBC, BMP  3.  Pending labs/ test needing follow-up: None  Follow-up Appointments:  Follow-up Information     Steven Mustard, MD Follow up in 1 week(s).   Specialty: Orthopedic Surgery Contact information: [address] [place] [address] [phone]         [facility] Follow up.   Specialty: Home Health Services Why: Steven Cochran will contact you within 48 hours of discharge to arrang intial home visit for Physical Therapy Contact information: [address] [place] [address] [phone]         [facility] Follow up.   Why: [employer] will be providing your crutches Contact information: [address] [place] [address] [phone]         [facility]. Go on 10/08/2022.   Why: Please arrive 20-30 minutes early for your appointment at 9:45 Contact information: [address] [place] [address] [phone]                Hospital Course by problem list:  Oral Steven Cochran is a 52 y.o. person living with a history of HFrEF, stroke, hypertension, type 2 diabetes with neuropathy, hyperlipidemia, microcytic anemia, and left eye blindness who presented with left foot infection and admitted for IV antibiotics and operative management on 09/18/2022.   # Left fifth metatarsal and proximal phalanx osteomyelitis # Left fifth metatarsophalangeal joint septic arthritis # Hx of diabetic foot ulcer Patient presented with wound on left lateral forefoot that began after using a pumice stone to shave off some skin. MRI on presentation demonstrated osteomyelitis of the fifth metatarsal and proximal phalanx with septic arthritis of the fifth metatarsophalangeal joint. Broad spectrum antibiotics were started.  He did have some asymmetric lower extremity edema on the day of admission.  DVT study was negative.  ABIs showed good lower extremity blood flow  bilaterally.  Orthopedic surgery was consulted and recommended left fifth ray amputation. Patient underwent left fifth ray amputation on 5/22. Wound margins were clear. Broad-spectrum antibiotics discontinued with no indication for further antibiotic therapy at this time. Wound vac will remain in place until 1-week follow-up with Dr. Lajoyce Corners outpatient. PT/OT recommended home health follow up and touchdown weightbearing until ortho follow up. Patient will need to use walker temporarily. DME arranged.  # Diabetes, type II with neurologic manifestations A1c 8.5 on 08/29/2022. Patient was taking insulin glargine 20 units daily, Jardiance 25 mg  daily, and Ozempic 0.50 mg weekly at home. Blood glucose was well-controlled throughout hospitalization on Semglee 10 units, SSI, and Jardiance 25 mg daily. Will discharge with insulin glargine 15 units daily and Jardiance 25 mg daily and he can restart his ozempic.    # Combined CHF # Hypertension Last echo 1 year ago with EF of 45 to 50%. Aortic root dilatation noted. Patient remained euvolemic throughout hospitalization. Blood pressure elevated this admission. Will start spironolactone 12.5 mg daily at discharge. Will also continue amlodipine 5 mg daily, carvedilol 25 mg twice daily, and Entresto 97-103 mg twice daily. Of note, consider whether amlodipine vs CHF is causing LE edema and follow up as appropriate.   # History embolic stroke # Hyperlipidemia Held aspirin on admission due to potential for surgical intervention. VTE prophylaxis with Lovenox continued throughout hospitalization other than 24 hrs before and after procedure. Will resume aspirin at discharge. Will continue Crestor 40 mg daily at discharge.  #Microcytic anemia Pt presents with mixed microcytic anemia, ferritin likely elevated due to acute inflammation from osteomyelitis.  Iron sat ratio was 18.  Iron supplementation not started in the setting of acute infection.  Can consider initiating in the  outpatient setting if needed.  Hemoglobin on day of discharge was 8.3 down from baseline of 10 with surgery.  Please continue monitoring in the outpatient setting  Subjective: No significant overnight events. Patient doing well this morning. He denies experiencing any pain in his left foot. He reports his performance with PT/OT was poor because he was confused regarding his weightbearing status. Explained recommendations for touchdown weightbearing and how he can rest his left foot on the floor while walking.  Discharge Vitals:   BP 134/79 (BP Location: Left Arm)   Pulse 84   Temp 98.4 F (36.9 C) (Oral)   Resp 19   Ht 6' 2.5" (1.892 m)   Wt 115.2 kg   SpO2 99%   BMI 32.17 kg/m  Discharge exam:  Constitutional: Appears stated age. Sitting upright in bed. Appears comfortable and in no acute distress. Cardiovascular: Regular rate, regular rhythm. No murmurs, rubs, or gallops. Extremities warm and well-perfused.  Pulmonary: Normal respiratory effort. No wheezes, rales, rhonchi, or crackles.   Abdominal: Soft. Non-distended. Normal bowel sounds. MSK: LLE with trace edema around ankle > RLE with no edema. Neurological: Insensate in both feet after burns Skin: Warm and dry. Wound vac in place on left foot.  Dressings clean dry and intact    Pertinent Labs, Studies, and Procedures:     Latest Ref Rng & Units 09/28/2022    4:21 AM 09/27/2022    1:43 AM 09/26/2022    4:10 AM  CBC  WBC 4.0 - 10.5 K/uL 8.7  7.9  6.5   Hemoglobin 13.0 - 17.0 g/dL 8.3  8.8  8.9   Hematocrit 39.0 - 52.0 % 27.3  28.3  28.9   Platelets 150 - 400 K/uL 394  387  423        Latest Ref Rng & Units 09/28/2022    4:21 AM 09/27/2022    1:43 AM 09/26/2022    4:10 AM  CMP  Glucose 70 - 99 mg/dL 161  096  045   BUN 6 - 20 mg/dL 25  20  22    Creatinine 0.61 - 1.24 mg/dL 4.09  8.11  9.14   Sodium 135 - 145 mmol/L 137  135  134   Potassium 3.5 - 5.1 mmol/L 3.9  4.0  3.9  Chloride 98 - 111 mmol/L 106  104  105   CO2  22 - 32 mmol/L 25  26  25    Calcium 8.9 - 10.3 mg/dL 8.6  8.6  8.5     VAS [place] LOWER EXTREMITY VENOUS (DVT)  Result Date: 09/19/2022  Lower Venous DVT Study Patient Name:  [facility]  Date of Exam:   09/19/2022 Medical Rec #: [id number]      Accession #:    [id number] Date of Birth: 12/30/1970      Patient Gender: M Patient Age:   7 years Exam Location:  [facility] Procedure:      VAS [place] LOWER EXTREMITY VENOUS (DVT) Referring Phys: ERIK HOFFMAN --------------------------------------------------------------------------------  Indications: Swelling.  Risk Factors: None identified. Limitations: Poor ultrasound/tissue interface and patient positioning. Comparison Study: No prior studies. Performing Technologist: Chanda Busing RVT  Examination Guidelines: A complete evaluation includes B-mode imaging, spectral Doppler, color Doppler, and power Doppler as needed of all accessible portions of each vessel. Bilateral testing is considered an integral part of a complete examination. Limited examinations for reoccurring indications may be performed as noted. The reflux portion of the exam is performed with the patient in reverse Trendelenburg.  +---------+---------------+---------+-----------+----------+--------------+ RIGHT    CompressibilityPhasicitySpontaneityPropertiesThrombus Aging +---------+---------------+---------+-----------+----------+--------------+ CFV      Full           Yes      Yes                                 +---------+---------------+---------+-----------+----------+--------------+ SFJ      Full                                                        +---------+---------------+---------+-----------+----------+--------------+ FV Prox  Full                                                        +---------+---------------+---------+-----------+----------+--------------+ FV Mid   Full                                                         +---------+---------------+---------+-----------+----------+--------------+ FV DistalFull                                                        +---------+---------------+---------+-----------+----------+--------------+ PFV      Full                                                        +---------+---------------+---------+-----------+----------+--------------+ POP      Full           Yes  Yes                                 +---------+---------------+---------+-----------+----------+--------------+ PTV      Full                                                        +---------+---------------+---------+-----------+----------+--------------+ PERO     Full                                                        +---------+---------------+---------+-----------+----------+--------------+   +---------+---------------+---------+-----------+----------+--------------+ LEFT     CompressibilityPhasicitySpontaneityPropertiesThrombus Aging +---------+---------------+---------+-----------+----------+--------------+ CFV      Full           Yes      Yes                                 +---------+---------------+---------+-----------+----------+--------------+ SFJ      Full                                                        +---------+---------------+---------+-----------+----------+--------------+ FV Prox  Full                                                        +---------+---------------+---------+-----------+----------+--------------+ FV Mid   Full                                                        +---------+---------------+---------+-----------+----------+--------------+ FV DistalFull                                                        +---------+---------------+---------+-----------+----------+--------------+ PFV      Full                                                         +---------+---------------+---------+-----------+----------+--------------+ POP      Full           Yes      Yes                                 +---------+---------------+---------+-----------+----------+--------------+ PTV      Full                                                        +---------+---------------+---------+-----------+----------+--------------+  PERO     Full                                                        +---------+---------------+---------+-----------+----------+--------------+     Summary: RIGHT: - There is no evidence of deep vein thrombosis in the lower extremity.  - No cystic structure found in the popliteal fossa.  LEFT: - There is no evidence of deep vein thrombosis in the lower extremity.  - No cystic structure found in the popliteal fossa. - Ultrasound characteristics of enlarged lymph nodes noted in the groin.  *See table(s) above for measurements and observations. Electronically signed by Coral Else MD on 09/19/2022 at 3:41:42 PM.    Final    MR FOOT LEFT W WO CONTRAST  Result Date: 09/18/2022 CLINICAL DATA:  Osteomyelitis suspected. EXAM: MRI OF THE LEFT FOREFOOT WITHOUT AND WITH CONTRAST TECHNIQUE: Multiplanar, multisequence MR imaging of the left forefoot was performed both before and after administration of intravenous contrast. CONTRAST:  10mL GADAVIST GADOBUTROL 1 MMOL/ML IV SOLN COMPARISON:  Radiographs dated Sep 18, 2018 FINDINGS: Bones/Joint/Cartilage Bone marrow edema of the fifth metacarpal and proximal phalanx with subluxation at the fifth metatarsophalangeal joint consistent with osteomyelitis with septic arthritis. Ligaments Lisfranc and collateral ligaments appear intact. Muscles and Tendons Increased intrasubstance signal of the plantar muscles and tendons. No fluid collection or abscess. Deep skin Soft tissues Deep skin wound with subcutaneous emphysema and marked surrounding inflammatory changes about the fifth metatarsal head and  proximal phalanx. IMPRESSION: 1. Osteomyelitis of the fifth metatarsal and proximal phalanx with septic arthritis of the fifth metatarsophalangeal joint. 2. Deep skin wound with subcutaneous emphysema and marked surrounding inflammatory changes about the fifth metatarsal head and proximal phalanx. No fluid collection or abscess. Electronically Signed   By: Larose Hires D.O.   On: 09/18/2022 21:44   DG Foot Complete Left  Result Date: 09/18/2022 CLINICAL DATA:  LATERAL LEFT foot wound. EXAM: LEFT FOOT - COMPLETE 3+ VIEW COMPARISON:  06/13/2022 FINDINGS: Gas in the soft tissue overlying the 5th MTP joint noted with apparent subluxation/dislocation at the 5th MTP joint. Irregularity of the 5th metatarsal head is noted suggesting traumatic injury or osteomyelitis. Vascular calcifications are present. Surgical staple overlying the great toe again noted. IMPRESSION: Gas in the soft tissue overlying the 5th MTP joint with apparent subluxation/dislocation at the 5th MTP joint. Irregularity of the 5th metatarsal head suggesting traumatic injury or osteomyelitis. Consider MRI for further evaluation. Electronically Signed   By: Harmon Pier M.D.   On: 09/18/2022 16:14     Discharge Instructions: Discharge Instructions     Call MD for:  redness, tenderness, or signs of infection (pain, swelling, redness, odor or green/yellow discharge around incision site)   Complete by: As directed    Diet - low sodium heart healthy   Complete by: As directed    Discharge instructions   Complete by: As directed    It was a pleasure caring for you during your hospital stay! You were admitted for a bone and joint infection of the left fifth toe. You received antibiotics to treat the infection during your hospitalization. You also had surgery to remove the infected bone and joint. You do not need any antibiotics at home because the surgeon was able to remove all of the infection. A physical and occupational  therapist will come to  your house periodically to help you regain your strength and learn to walk without putting a lot of weight on your left foot. Your blood sugars were well-controlled while hospitalized, so we are decreasing your home insulin dose to 15 units at night. Your blood pressure was high during your hospital stay, so we are adding another blood pressure medicine called spironolactone. You will need to follow-up with orthopedics (Dr. Lajoyce Corners) in one week to check on your foot and wound vac. You will need to follow up with your PCP to check your blood pressure and blood sugars. Medication changes:  1. Start taking spironolactone 12.5 mg once daily 2. Decrease insulin glargine to 15 units at night   Increase activity slowly   Complete by: As directed    Leave dressing on - Keep it clean, dry, and intact until clinic visit   Complete by: As directed    Negative Pressure Wound Therapy - Incisional   Complete by: As directed    Other Restrictions   Complete by: As directed    Avoid putting weight on the front part of your left foot.  Please follow-up with [place] in clinic and with Dr. Lajoyce Corners before returning to work.  Anticipate you will be out of work for about a month.       Discharge Instructions   None     Signed: Lyndle Herrlich, MD 09/28/2022, 2:50 PM   Pager: [phone]

## 2022-10-03 DIAGNOSIS — E1159 Type 2 diabetes mellitus with other circulatory complications: Secondary | ICD-10-CM | POA: Diagnosis not present

## 2022-10-03 DIAGNOSIS — N1831 Chronic kidney disease, stage 3a: Secondary | ICD-10-CM | POA: Diagnosis not present

## 2022-10-03 DIAGNOSIS — D539 Nutritional anemia, unspecified: Secondary | ICD-10-CM | POA: Diagnosis not present

## 2022-10-03 DIAGNOSIS — E1140 Type 2 diabetes mellitus with diabetic neuropathy, unspecified: Secondary | ICD-10-CM | POA: Diagnosis not present

## 2022-10-03 DIAGNOSIS — I5042 Chronic combined systolic (congestive) and diastolic (congestive) heart failure: Secondary | ICD-10-CM | POA: Diagnosis not present

## 2022-10-03 DIAGNOSIS — E1122 Type 2 diabetes mellitus with diabetic chronic kidney disease: Secondary | ICD-10-CM | POA: Diagnosis not present

## 2022-10-03 DIAGNOSIS — I130 Hypertensive heart and chronic kidney disease with heart failure and stage 1 through stage 4 chronic kidney disease, or unspecified chronic kidney disease: Secondary | ICD-10-CM | POA: Diagnosis not present

## 2022-10-03 DIAGNOSIS — Z4781 Encounter for orthopedic aftercare following surgical amputation: Secondary | ICD-10-CM | POA: Diagnosis not present

## 2022-10-03 DIAGNOSIS — E785 Hyperlipidemia, unspecified: Secondary | ICD-10-CM | POA: Diagnosis not present

## 2022-10-04 ENCOUNTER — Telehealth: Payer: Self-pay | Admitting: Orthopedic Surgery

## 2022-10-04 NOTE — Telephone Encounter (Signed)
Pt has his first post op appt tomorrow, will eval at that time about PT and wtb status.

## 2022-10-04 NOTE — Telephone Encounter (Signed)
Marcelino Duster (PT) from [facility] called requesting verbal orders for 1 wk 5 for mobility and safety. Please call or leave detailed message on [facility] secure line at [phone].

## 2022-10-05 ENCOUNTER — Encounter: Payer: Self-pay | Admitting: Family

## 2022-10-05 ENCOUNTER — Ambulatory Visit (INDEPENDENT_AMBULATORY_CARE_PROVIDER_SITE_OTHER): Payer: Medicare HMO | Admitting: Family

## 2022-10-05 ENCOUNTER — Ambulatory Visit: Payer: Medicaid Other | Admitting: Podiatry

## 2022-10-05 ENCOUNTER — Other Ambulatory Visit: Payer: Self-pay | Admitting: Internal Medicine

## 2022-10-05 ENCOUNTER — Telehealth: Payer: Self-pay | Admitting: Family

## 2022-10-05 DIAGNOSIS — E1149 Type 2 diabetes mellitus with other diabetic neurological complication: Secondary | ICD-10-CM

## 2022-10-05 DIAGNOSIS — Z89432 Acquired absence of left foot: Secondary | ICD-10-CM

## 2022-10-05 NOTE — Telephone Encounter (Signed)
Ozempic is not on current med list - states it has expired.

## 2022-10-05 NOTE — Telephone Encounter (Signed)
10/05/22 oow note faxed to [facility] [phone]-claim# [id number]

## 2022-10-05 NOTE — Progress Notes (Signed)
   Post-Op Visit Note   Patient: Steven Cochran           Date of Birth: [birth year]           MRN: [id number] Visit Date: 10/05/2022 PCP: Gwenevere Abbot, MD  Chief Complaint:  Chief Complaint  Patient presents with   Left Foot - Routine Post Op    09/26/22 left 5th ray amputation    HPI:  HPI The patient is a 52 year old gentleman seen status post left fifth ray amputation on May 22 wound VAC removed today. Ortho Exam On examination left fifth ray amputation this is approximated sutures there is no gaping or drainage actively scant surrounding maceration  Visit Diagnoses: No diagnosis found.  Plan: Begin daily dose of cleansing dry dressings nonweightbearing follow-up in 2 weeks    Follow-Up Instructions: Return in about 2 weeks (around 10/19/2022).   Imaging: No results found.  Orders:  No orders of the defined types were placed in this encounter.  No orders of the defined types were placed in this encounter.    PMFS History: Patient Active Problem List   Diagnosis Date Noted   Essential hypertension 09/27/2022   Osteomyelitis of fifth toe of left foot (HCC) 09/18/2022   Primary open angle glaucoma 08/29/2022   Preventive measure 07/15/2022   Hyperlipidemia associated with type 2 diabetes mellitus (HCC) 07/15/2022   Microcytic anemia 07/15/2022   Diabetic ulcer of left foot associated with type 2 diabetes mellitus (HCC) 06/14/2022   Embolic stroke (HCC) 09/02/2021   Type II diabetes mellitus with neurological manifestations (HCC) 09/02/2021   Chronic systolic CHF (congestive heart failure) (HCC) 09/02/2021   HTN (hypertension) 06/30/2012   Past Medical History:  Diagnosis Date   Cataract    CHF (congestive heart failure) (HCC)    Diabetes mellitus    Hypertension     Family History  Problem Relation Age of Onset   Colon cancer Neg Hx    Esophageal cancer Neg Hx    Stomach cancer Neg Hx    Rectal cancer Neg Hx     Past Surgical History:  Procedure  Laterality Date   AMPUTATION Left 09/26/2022   Procedure: LEFT FOOT 5TH RAY AMPUTATION;  Surgeon: Nadara Mustard, MD;  Location: [facility] OR;  Service: Orthopedics;  Laterality: Left;   CATARACT EXTRACTION Bilateral    SKIN GRAFT     feet   Social History   Occupational History   Not on file  Tobacco Use   Smoking status: Never   Smokeless tobacco: Never  Vaping Use   Vaping Use: Never used  Substance and Sexual Activity   Alcohol use: No   Drug use: No   Sexual activity: Not Currently    Partners: Female    Birth control/protection: None

## 2022-10-08 ENCOUNTER — Encounter: Payer: Medicare HMO | Admitting: Student

## 2022-10-08 ENCOUNTER — Telehealth: Payer: Self-pay

## 2022-10-08 NOTE — Telephone Encounter (Signed)
Called [place] lm on vm stating that VO for PT is okay but he is non weighbearing on left foot.

## 2022-10-08 NOTE — Telephone Encounter (Signed)
Sree with [facility] would like a nurse order for patient.  CB#[phone].  Please advise.  Thank you.

## 2022-10-09 ENCOUNTER — Telehealth: Payer: Self-pay | Admitting: Family

## 2022-10-09 NOTE — Telephone Encounter (Signed)
Received call from patient stating Steven Cochran did not get the note that was faxed on Friday. I advised patient that I would re-fax note. I refaxed oow note to [place] [phone]

## 2022-10-10 NOTE — Telephone Encounter (Signed)
SW [place], gave him VO okay for nurse eval. PT on hold right now as he is non weight bearing and pt refused PT at this time.

## 2022-10-11 DIAGNOSIS — I5042 Chronic combined systolic (congestive) and diastolic (congestive) heart failure: Secondary | ICD-10-CM | POA: Diagnosis not present

## 2022-10-11 DIAGNOSIS — E1122 Type 2 diabetes mellitus with diabetic chronic kidney disease: Secondary | ICD-10-CM | POA: Diagnosis not present

## 2022-10-11 DIAGNOSIS — D539 Nutritional anemia, unspecified: Secondary | ICD-10-CM | POA: Diagnosis not present

## 2022-10-11 DIAGNOSIS — N1831 Chronic kidney disease, stage 3a: Secondary | ICD-10-CM | POA: Diagnosis not present

## 2022-10-11 DIAGNOSIS — E1140 Type 2 diabetes mellitus with diabetic neuropathy, unspecified: Secondary | ICD-10-CM | POA: Diagnosis not present

## 2022-10-11 DIAGNOSIS — I130 Hypertensive heart and chronic kidney disease with heart failure and stage 1 through stage 4 chronic kidney disease, or unspecified chronic kidney disease: Secondary | ICD-10-CM | POA: Diagnosis not present

## 2022-10-11 DIAGNOSIS — Z4781 Encounter for orthopedic aftercare following surgical amputation: Secondary | ICD-10-CM | POA: Diagnosis not present

## 2022-10-11 DIAGNOSIS — E785 Hyperlipidemia, unspecified: Secondary | ICD-10-CM | POA: Diagnosis not present

## 2022-10-11 DIAGNOSIS — E1159 Type 2 diabetes mellitus with other circulatory complications: Secondary | ICD-10-CM | POA: Diagnosis not present

## 2022-10-11 NOTE — Telephone Encounter (Signed)
Next appt scheduled 6/12 with Dr Daiva Eves.

## 2022-10-12 DIAGNOSIS — Z4781 Encounter for orthopedic aftercare following surgical amputation: Secondary | ICD-10-CM | POA: Diagnosis not present

## 2022-10-12 DIAGNOSIS — N1831 Chronic kidney disease, stage 3a: Secondary | ICD-10-CM | POA: Diagnosis not present

## 2022-10-12 DIAGNOSIS — E1122 Type 2 diabetes mellitus with diabetic chronic kidney disease: Secondary | ICD-10-CM | POA: Diagnosis not present

## 2022-10-12 DIAGNOSIS — E1140 Type 2 diabetes mellitus with diabetic neuropathy, unspecified: Secondary | ICD-10-CM | POA: Diagnosis not present

## 2022-10-12 DIAGNOSIS — E1159 Type 2 diabetes mellitus with other circulatory complications: Secondary | ICD-10-CM | POA: Diagnosis not present

## 2022-10-12 DIAGNOSIS — E785 Hyperlipidemia, unspecified: Secondary | ICD-10-CM | POA: Diagnosis not present

## 2022-10-12 DIAGNOSIS — D539 Nutritional anemia, unspecified: Secondary | ICD-10-CM | POA: Diagnosis not present

## 2022-10-12 DIAGNOSIS — I5042 Chronic combined systolic (congestive) and diastolic (congestive) heart failure: Secondary | ICD-10-CM | POA: Diagnosis not present

## 2022-10-12 DIAGNOSIS — I130 Hypertensive heart and chronic kidney disease with heart failure and stage 1 through stage 4 chronic kidney disease, or unspecified chronic kidney disease: Secondary | ICD-10-CM | POA: Diagnosis not present

## 2022-10-17 ENCOUNTER — Ambulatory Visit (INDEPENDENT_AMBULATORY_CARE_PROVIDER_SITE_OTHER): Payer: Medicare HMO | Admitting: Student

## 2022-10-17 ENCOUNTER — Encounter: Payer: Self-pay | Admitting: Dietician

## 2022-10-17 VITALS — BP 140/90 | HR 73 | Temp 98.3°F | Ht 74.5 in | Wt 256.0 lb

## 2022-10-17 DIAGNOSIS — I5022 Chronic systolic (congestive) heart failure: Secondary | ICD-10-CM

## 2022-10-17 DIAGNOSIS — Z7984 Long term (current) use of oral hypoglycemic drugs: Secondary | ICD-10-CM | POA: Diagnosis not present

## 2022-10-17 DIAGNOSIS — E1169 Type 2 diabetes mellitus with other specified complication: Secondary | ICD-10-CM | POA: Diagnosis not present

## 2022-10-17 DIAGNOSIS — I110 Hypertensive heart disease with heart failure: Secondary | ICD-10-CM

## 2022-10-17 DIAGNOSIS — M869 Osteomyelitis, unspecified: Secondary | ICD-10-CM

## 2022-10-17 DIAGNOSIS — D509 Iron deficiency anemia, unspecified: Secondary | ICD-10-CM

## 2022-10-17 DIAGNOSIS — M86172 Other acute osteomyelitis, left ankle and foot: Secondary | ICD-10-CM | POA: Diagnosis not present

## 2022-10-17 DIAGNOSIS — I10 Essential (primary) hypertension: Secondary | ICD-10-CM | POA: Diagnosis not present

## 2022-10-17 DIAGNOSIS — Z794 Long term (current) use of insulin: Secondary | ICD-10-CM

## 2022-10-17 DIAGNOSIS — E785 Hyperlipidemia, unspecified: Secondary | ICD-10-CM

## 2022-10-17 DIAGNOSIS — E1149 Type 2 diabetes mellitus with other diabetic neurological complication: Secondary | ICD-10-CM

## 2022-10-17 MED ORDER — ROSUVASTATIN CALCIUM 40 MG PO TABS
40.0000 mg | ORAL_TABLET | Freq: Every day | ORAL | 0 refills | Status: DC
Start: 2022-10-17 — End: 2022-12-06

## 2022-10-17 MED ORDER — EMPAGLIFLOZIN 25 MG PO TABS
25.0000 mg | ORAL_TABLET | Freq: Every day | ORAL | 11 refills | Status: DC
Start: 2022-10-17 — End: 2022-12-19

## 2022-10-17 MED ORDER — CARVEDILOL 25 MG PO TABS
25.0000 mg | ORAL_TABLET | Freq: Two times a day (BID) | ORAL | 3 refills | Status: DC
Start: 2022-10-17 — End: 2022-12-18

## 2022-10-17 NOTE — Patient Instructions (Signed)
Thank you, Steven Cochran for allowing [place] to provide your care today. Today we discussed your recent hospitalization, blood pressure, diabetes, heart failure, we talked your surgery and medicatiosn.     Know that you have benefits for transportation and other needs through your insurance. Let us know if we need to refer you for help  -Continue your insulin and Ozempic. I had ordered the Jardiance at 25 mg daily. Please hydrate well - Continue on Entresto, Amlodipine, and spironolactone. I will check your renal function and your electrolytes today.  We would like to see you in one month, which would be 2 months from your surgery and 3 months from the last time we checked your blood sugars.   PLEASE CALL BACK AT THE BEGINNING OF jULY TO MAKE AN APPOINTMENT FOR THE MIDDLE JULY  I have ordered the following labs for you:   Lab Orders         BMP8+Anion Gap      I will call if any are abnormal. All of your labs can be accessed through "My Chart".   My Chart Access: [email or web address]     We look forward to seeing you next time. Please call our clinic at [phone] if you have any questions or concerns. The best time to call is Monday-Friday from 9am-4pm, but there is someone available 24/7. If after hours or the weekend, call the main hospital number and ask for the Internal Medicine Resident On-Call. If you need medication refills, please notify your pharmacy one week in advance and they will send [place] a request.   Thank you for letting us take part in your care. Wishing you the best!  Morene Crocker, MD 10/17/2022, 3:58 PM [facility] Internal Medicine Resident, PGY-1

## 2022-10-17 NOTE — Progress Notes (Signed)
Subjective:  CC: HFU  HPI:  [name] is a 52 y.o. male with a past medical history stated below and presents today for HFU after L foot osteomyelitis  and septic arthritis s/p L fifth ray amputation. Please see problem based assessment and plan for additional details.  Past Medical History:  Diagnosis Date   Cataract    CHF (congestive heart failure) (HCC)    Diabetes mellitus    Hypertension     Current Outpatient Medications on File Prior to Visit  Medication Sig Dispense Refill   amLODipine (NORVASC) 5 MG tablet Take 1 tablet (5 mg total) by mouth daily. 30 tablet 3   aspirin EC 81 MG EC tablet Take 1 tablet (81 mg total) by mouth daily. Swallow whole. 30 tablet 11   Blood Glucose Monitoring Suppl (FREESTYLE LITE) w/Device KIT Use as directed 1 kit 0   Continuous Glucose Sensor (FREESTYLE LIBRE 3 SENSOR) MISC 1 Device by Does not apply route every 14 (fourteen) days. Place 1 sensor on the skin every 14 days. Use to check glucose continuously 2 each 2   glucose blood (FREESTYLE LITE) test strip Monitor blood sugar at home 100 each 5   insulin glargine (LANTUS) 100 UNIT/ML Solostar Pen Inject 15 Units into the skin daily. 6 mL 1   Insulin Pen Needle 32G X 4 MM MISC Use with Lantus 100 each 0   Lancets (FREESTYLE) lancets Use a directed 100 each 0   OZEMPIC, 0.25 OR 0.5 MG/DOSE, 2 MG/3ML SOPN INJECT 0.5MG  INTO THE SKIN ONCE WEEKLY 3 mL 0   polyethylene glycol powder (GLYCOLAX/MIRALAX) 17 GM/SCOOP powder Take 17 g by mouth daily as needed for mild constipation. Dilute as directed on bottle. 238 g 0   sacubitril-valsartan (ENTRESTO) 97-103 MG Take 1 tablet by mouth 2 (two) times daily. 60 tablet 11   spironolactone (ALDACTONE) 25 MG tablet Take 1/2 tablet (12.5 mg total) by mouth daily. 30 tablet 11   No current facility-administered medications on file prior to visit.    Family History  Problem Relation Age of Onset   Colon cancer Neg Hx    Esophageal cancer Neg Hx     Stomach cancer Neg Hx    Rectal cancer Neg Hx     Social History   Socioeconomic History   Marital status: Single    Spouse name: Not on file   Number of children: Not on file   Years of education: Not on file   Highest education level: Not on file  Occupational History   Not on file  Tobacco Use   Smoking status: Never   Smokeless tobacco: Never  Vaping Use   Vaping Use: Never used  Substance and Sexual Activity   Alcohol use: No   Drug use: No   Sexual activity: Not Currently    Partners: Female    Birth control/protection: None  Other Topics Concern   Not on file  Social History Narrative   Not on file   Social Determinants of Health   Financial Resource Strain: Low Risk  (07/12/2022)   Overall Financial Resource Strain (CARDIA)    Difficulty of Paying Living Expenses: Not hard at all  Food Insecurity: No Food Insecurity (07/12/2022)   Hunger Vital Sign    Worried About Running Out of Food in the Last Year: Never true    Ran Out of Food in the Last Year: Never true  Transportation Needs: Unmet Transportation Needs (07/12/2022)   PRAPARE - Transportation  Lack of Transportation (Medical): Yes    Lack of Transportation (Non-Medical): Yes  Physical Activity: Sufficiently Active (07/12/2022)   Exercise Vital Sign    Days of Exercise per Week: 5 days    Minutes of Exercise per Session: 40 min  Stress: No Stress Concern Present (07/12/2022)   [employer] of Occupational Health - Occupational Stress Questionnaire    Feeling of Stress : Not at all  Social Connections: Socially Isolated (07/12/2022)   Social Connection and Isolation Panel [NHANES]    Frequency of Communication with Friends and Family: More than three times a week    Frequency of Social Gatherings with Friends and Family: Never    Attends Religious Services: Never    Database administrator or Organizations: No    Attends Banker Meetings: Never    Marital Status: Divorced  Careers information officer Violence: Not At Risk (07/12/2022)   Humiliation, Afraid, Rape, and Kick questionnaire    Fear of Current or Ex-Partner: No    Emotionally Abused: No    Physically Abused: No    Sexually Abused: No    Review of Systems: ROS negative except for what is noted on the assessment and plan.  Objective:   Vitals:   10/17/22 1442  BP: (!) 140/90  Pulse: 73  Temp: 98.3 F (36.8 C)  TempSrc: Oral  SpO2: 100%  Weight: 256 lb (116.1 kg)  Height: 6' 2.5" (1.892 m)    Physical Exam: Constitutional: well-appearing male sitting in chair, in no acute distress HENT: normocephalic atraumatic, mucous membranes moist Eyes: conjunctiva non-erythematous Neck: supple Cardiovascular: regular rate and rhythm, no m/r/g Pulmonary/Chest: normal work of breathing on room air, lungs clear to auscultation bilaterally Abdominal: soft, non-tender, non-distended MSK: normal bulk and tone. L foot wrapped at the incision with clean bandages, no surrounding tenderness, edema, or changes in temperature.  Neurological: alert & oriented x 3 Skin: warm and dry Psych: Pleasant mood and affect       10/17/2022    2:44 PM  Depression screen PHQ 2/9  Decreased Interest 0  Down, Depressed, Hopeless 0  PHQ - 2 Score 0        No data to display           Assessment & Plan:   Osteomyelitis of fifth toe of left foot (HCC) Patient with uncontrolled DM recently hospitalized with L foot ulcer fund to have osteomyelitis of the L fifth ray s/p amputation with Dr. Lajoyce Corners. Patient was treated with broad spectrum antibiotics and discharged with wound vac. Patient was seen at OV FU with Dr. Lajoyce Corners and wound vac was removed. Today, he denies symptoms or signs of infection. PT and OT are established. Patient feels that the OT was not helpful. Now walking with a walker. Denies pain today.   - Follow up with Dr. Lajoyce Corners this week for second post op appointment - Continue OT  Type II diabetes mellitus with neurological  manifestations (HCC) A1c 8.5 on 08/29/2022. . Insuline Glargine was decreased during recent hospitalization to 15 from 20. Patient was continued on Jardiance 25 mg while at hospital, however, he did not receive refill on discharge and has been without it since. Otherwise, continues with Ozempic 0.5 mg weekly without GI side effects.   Review of cGM reveals his fasting BG are 130-140 and post prandial ~150-200. Patient microalbumin/Cr ratio 1997. Patient is progressing adequately. Will Continue current medications for now. If improvement in A1c at follow up, patient's insulin could be  down titrated and consider increasing ozempic dose. - RTC in about one month which should be his 73-month DM follow up  Chronic systolic CHF (congestive heart failure) (HCC) Patient's GDMT escalated during recent hospitalization to include Spironolactone 12.5 mg daily as his BP was elevated.  No signs of volume overload today. - Continue  -carvedilol 25 mg twice daily -Entresto 97-103 mg twice daily -Jardiance 25 mg daily - Spironolactone 12.5 daily  Addendum: K 5.2 on recheck. Will not opt for increasing Spironolactone at this time.   HTN (hypertension) Hypertensive today without AM HTN medication. Patient is asymptomatic. Will recheck BMP today to assess K after spironolactone initiation. If appropriate, would consider stopping amlodipine. However, given patient has not been on Jardiance since discharge, will await Jardiance reinitiation at previous dose and monitor BP at home. He will keep log and bring at 1 month follow up - f/u BMP - continue current medications  Addendum K elevated at 5.2. Will continue Amlodipine and rest of current medications. Please repeat BMP at follow up for K monitoring  Hyperlipidemia associated with type 2 diabetes mellitus (HCC) Currently on Crestor 20 mg   Microcytic anemia Patient with the following labs in the past 4 weeks: Microcytic anemia with Hgb 8.3 MCV 70 and RDW wnl  at 14.8. Ferritin of 555. TIBC165sat ratios 18. Patient has normal renal function.   With this elevated ferritin and decreased TIBC, suspect anemia of chronic disease in this gentleman. Suspect blood loss from recent surgery contributing. However, upon review, I see that his he has has persistent microcytPatient has been referred to GI and was to see Dr. Tomasa Rand but patient was hospitalized during this time.  Will hold off on repeating CBC/iron studies today. Patient is not currently symptomatic and still recovering from recent surgery, which may be reflected in blood work. Favor that patient follow up with  GI for colonoscopy. Provided Dr. Milas Hock office number to schedule appointment -CBC at next OV visit   Patient to call back and make follow up appointment in 1 month for DM and BMP follow up   Patient discussed with Dr. Gardiner Ramus, MD [facility] Internal Medicine Program - PGY-1 10/18/2022, 1:19 PM

## 2022-10-18 ENCOUNTER — Encounter: Payer: Self-pay | Admitting: Student

## 2022-10-18 DIAGNOSIS — I5042 Chronic combined systolic (congestive) and diastolic (congestive) heart failure: Secondary | ICD-10-CM | POA: Diagnosis not present

## 2022-10-18 DIAGNOSIS — I130 Hypertensive heart and chronic kidney disease with heart failure and stage 1 through stage 4 chronic kidney disease, or unspecified chronic kidney disease: Secondary | ICD-10-CM | POA: Diagnosis not present

## 2022-10-18 DIAGNOSIS — D539 Nutritional anemia, unspecified: Secondary | ICD-10-CM | POA: Diagnosis not present

## 2022-10-18 DIAGNOSIS — E1159 Type 2 diabetes mellitus with other circulatory complications: Secondary | ICD-10-CM | POA: Diagnosis not present

## 2022-10-18 DIAGNOSIS — E785 Hyperlipidemia, unspecified: Secondary | ICD-10-CM | POA: Diagnosis not present

## 2022-10-18 DIAGNOSIS — Z4781 Encounter for orthopedic aftercare following surgical amputation: Secondary | ICD-10-CM | POA: Diagnosis not present

## 2022-10-18 DIAGNOSIS — N1831 Chronic kidney disease, stage 3a: Secondary | ICD-10-CM | POA: Diagnosis not present

## 2022-10-18 DIAGNOSIS — E1140 Type 2 diabetes mellitus with diabetic neuropathy, unspecified: Secondary | ICD-10-CM | POA: Diagnosis not present

## 2022-10-18 DIAGNOSIS — E1122 Type 2 diabetes mellitus with diabetic chronic kidney disease: Secondary | ICD-10-CM | POA: Diagnosis not present

## 2022-10-18 LAB — BMP8+ANION GAP
Anion Gap: 13 mmol/L (ref 10.0–18.0)
BUN/Creatinine Ratio: 27 — ABNORMAL HIGH (ref 9–20)
BUN: 37 mg/dL — ABNORMAL HIGH (ref 6–24)
CO2: 20 mmol/L (ref 20–29)
Calcium: 8.7 mg/dL (ref 8.7–10.2)
Chloride: 103 mmol/L (ref 96–106)
Creatinine, Ser: 1.39 mg/dL — ABNORMAL HIGH (ref 0.76–1.27)
Glucose: 180 mg/dL — ABNORMAL HIGH (ref 70–99)
Potassium: 5.2 mmol/L (ref 3.5–5.2)
Sodium: 136 mmol/L (ref 134–144)
eGFR: 61 mL/min/{1.73_m2} (ref >59)

## 2022-10-18 NOTE — Assessment & Plan Note (Signed)
Patient with uncontrolled DM recently hospitalized with L foot ulcer fund to have osteomyelitis of the L fifth ray s/p amputation with Dr. Lajoyce Corners. Patient was treated with broad spectrum antibiotics and discharged with wound vac. Patient was seen at OV FU with Dr. Lajoyce Corners and wound vac was removed. Today, he denies symptoms or signs of infection. PT and OT are established. Patient feels that the OT was not helpful. Now walking with a walker. Denies pain today.   - Follow up with Dr. Lajoyce Corners this week for second post op appointment - Continue OT

## 2022-10-18 NOTE — Assessment & Plan Note (Signed)
Currently on Crestor 20 mg

## 2022-10-18 NOTE — Assessment & Plan Note (Signed)
Hypertensive today without AM HTN medication. Patient is asymptomatic. Will recheck BMP today to assess K after spironolactone initiation. If appropriate, would consider stopping amlodipine. However, given patient has not been on Jardiance since discharge, will await Jardiance reinitiation at previous dose and monitor BP at home. He will keep log and bring at 1 month follow up - f/u BMP - continue current medications  Addendum K elevated at 5.2. Will continue Amlodipine and rest of current medications. Please repeat BMP at follow up for K monitoring

## 2022-10-18 NOTE — Assessment & Plan Note (Addendum)
A1c 8.5 on 08/29/2022. . Insuline Glargine was decreased during recent hospitalization to 15 from 20. Patient was continued on Jardiance 25 mg while at hospital, however, he did not receive refill on discharge and has been without it since. Otherwise, continues with Ozempic 0.5 mg weekly without GI side effects.   Review of cGM reveals his fasting BG are 130-140 and post prandial ~150-200. Patient microalbumin/Cr ratio 1997. Patient is progressing adequately. Will Continue current medications for now. If improvement in A1c at follow up, patient's insulin could be down titrated and consider increasing ozempic dose. - RTC in about one month which should be his 110-month DM follow up

## 2022-10-18 NOTE — Assessment & Plan Note (Signed)
Patient's GDMT escalated during recent hospitalization to include Spironolactone 12.5 mg daily as his BP was elevated.  No signs of volume overload today. - Continue  -carvedilol 25 mg twice daily -Entresto 97-103 mg twice daily -Jardiance 25 mg daily - Spironolactone 12.5 daily  Addendum: K 5.2 on recheck. Will not opt for increasing Spironolactone at this time.

## 2022-10-18 NOTE — Assessment & Plan Note (Addendum)
Patient with the following labs in the past 4 weeks: Microcytic anemia with Hgb 8.3 MCV 70 and RDW wnl at 14.8. Ferritin of 555. TIBC165sat ratios 18. Patient has normal renal function.   With this elevated ferritin and decreased TIBC, suspect anemia of chronic disease in this gentleman. Suspect blood loss from recent surgery contributing. However, upon review, I see that his he has has persistent microcytPatient has been referred to GI and was to see Dr. Tomasa Rand but patient was hospitalized during this time.  Will hold off on repeating CBC/iron studies today. Patient is not currently symptomatic and still recovering from recent surgery, which may be reflected in blood work. Favor that patient follow up with  GI for colonoscopy. Provided Dr. Milas Hock office number to schedule appointment -CBC at next OV visit

## 2022-10-18 NOTE — Assessment & Plan Note (Signed)
>>  ASSESSMENT AND PLAN FOR OSTEOMYELITIS OF FIFTH TOE OF LEFT FOOT (HCC) WRITTEN ON 10/18/2022 10:22 AM BY GOMEZ-CARABALLO, MARIA, MD  Patient with uncontrolled DM recently hospitalized with L foot ulcer fund to have osteomyelitis of the L fifth ray s/p amputation with Dr. Lajoyce Corners. Patient was treated with broad spectrum antibiotics and discharged with wound vac. Patient was seen at OV FU with Dr. Lajoyce Corners and wound vac was removed. Today, he denies symptoms or signs of infection. PT and OT are established. Patient feels that the OT was not helpful. Now walking with a walker. Denies pain today.   - Follow up with Dr. Lajoyce Corners this week for second post op appointment - Continue OT

## 2022-10-19 ENCOUNTER — Ambulatory Visit (INDEPENDENT_AMBULATORY_CARE_PROVIDER_SITE_OTHER): Payer: Medicare HMO | Admitting: Family

## 2022-10-19 ENCOUNTER — Encounter: Payer: Self-pay | Admitting: Family

## 2022-10-19 DIAGNOSIS — Z89432 Acquired absence of left foot: Secondary | ICD-10-CM

## 2022-10-19 MED ORDER — SULFAMETHOXAZOLE-TRIMETHOPRIM 800-160 MG PO TABS: 1.0000 | ORAL_TABLET | Freq: Two times a day (BID) | ORAL | 0 refills | Status: DC

## 2022-10-19 NOTE — Progress Notes (Signed)
Internal Medicine Clinic Attending  Case discussed with Dr. Gomez-Caraballo  At the time of the visit.  We reviewed the resident's history and exam and pertinent patient test results.  I agree with the assessment, diagnosis, and plan of care documented in the resident's note.  

## 2022-10-19 NOTE — Addendum Note (Signed)
Addended by: Dickie La on: 10/19/2022 11:19 AM   Modules accepted: Level of Service

## 2022-10-19 NOTE — Progress Notes (Signed)
   Post-Op Visit Note   Patient: Steven Cochran           Date of Birth: 1971/03/16           MRN: [id number] Visit Date: 10/19/2022 PCP: Gwenevere Abbot, MD  Chief Complaint:  Chief Complaint  Patient presents with   Left Foot - Routine Post Op    09/26/2022 left foot 5th ray amputation     HPI:  HPI The patient is a 52 year old gentleman who is seen status post left foot fifth ray amputation he is using a walker and a postop shoe in the home.  He today has some drainage and a foul odor Ortho Exam On examination of the left the incision is approximated with sutures there is some dehiscence distally proximally there is 1 area that opened up filled in with necrotic tissue this is nickel size distally is open 2 cm x 1 cm filled in with granulation there is no active drainage however there is a foul odor no surrounding erythema no ascending cellulitis  Visit Diagnoses: No diagnosis found.  Plan: Continue daily dose of cleansing.  Begin Bactrim.  Nonweightbearing.  Follow-up in 2 weeks.  Follow-Up Instructions: No follow-ups on file.   Imaging: No results found.  Orders:  No orders of the defined types were placed in this encounter.  No orders of the defined types were placed in this encounter.    PMFS History: Patient Active Problem List   Diagnosis Date Noted   Essential hypertension 09/27/2022   Osteomyelitis of fifth toe of left foot (HCC) 09/18/2022   Primary open angle glaucoma 08/29/2022   Preventive measure 07/15/2022   Hyperlipidemia associated with type 2 diabetes mellitus (HCC) 07/15/2022   Microcytic anemia 07/15/2022   Diabetic ulcer of left foot associated with type 2 diabetes mellitus (HCC) 06/14/2022   Embolic stroke (HCC) 09/02/2021   Type II diabetes mellitus with neurological manifestations (HCC) 09/02/2021   Chronic systolic CHF (congestive heart failure) (HCC) 09/02/2021   HTN (hypertension) 06/30/2012   Past Medical History:  Diagnosis Date   Cataract     CHF (congestive heart failure) (HCC)    Diabetes mellitus    Hypertension     Family History  Problem Relation Age of Onset   Colon cancer Neg Hx    Esophageal cancer Neg Hx    Stomach cancer Neg Hx    Rectal cancer Neg Hx     Past Surgical History:  Procedure Laterality Date   AMPUTATION Left 09/26/2022   Procedure: LEFT FOOT 5TH RAY AMPUTATION;  Surgeon: Nadara Mustard, MD;  Location: [facility] OR;  Service: Orthopedics;  Laterality: Left;   CATARACT EXTRACTION Bilateral    SKIN GRAFT     feet   Social History   Occupational History   Not on file  Tobacco Use   Smoking status: Never   Smokeless tobacco: Never  Vaping Use   Vaping Use: Never used  Substance and Sexual Activity   Alcohol use: No   Drug use: No   Sexual activity: Not Currently    Partners: Female    Birth control/protection: None

## 2022-10-26 DIAGNOSIS — I5042 Chronic combined systolic (congestive) and diastolic (congestive) heart failure: Secondary | ICD-10-CM | POA: Diagnosis not present

## 2022-10-26 DIAGNOSIS — E785 Hyperlipidemia, unspecified: Secondary | ICD-10-CM | POA: Diagnosis not present

## 2022-10-26 DIAGNOSIS — N1831 Chronic kidney disease, stage 3a: Secondary | ICD-10-CM | POA: Diagnosis not present

## 2022-10-26 DIAGNOSIS — E1122 Type 2 diabetes mellitus with diabetic chronic kidney disease: Secondary | ICD-10-CM | POA: Diagnosis not present

## 2022-10-26 DIAGNOSIS — I130 Hypertensive heart and chronic kidney disease with heart failure and stage 1 through stage 4 chronic kidney disease, or unspecified chronic kidney disease: Secondary | ICD-10-CM | POA: Diagnosis not present

## 2022-10-26 DIAGNOSIS — E1159 Type 2 diabetes mellitus with other circulatory complications: Secondary | ICD-10-CM | POA: Diagnosis not present

## 2022-10-26 DIAGNOSIS — D539 Nutritional anemia, unspecified: Secondary | ICD-10-CM | POA: Diagnosis not present

## 2022-10-26 DIAGNOSIS — Z4781 Encounter for orthopedic aftercare following surgical amputation: Secondary | ICD-10-CM | POA: Diagnosis not present

## 2022-10-26 DIAGNOSIS — E1140 Type 2 diabetes mellitus with diabetic neuropathy, unspecified: Secondary | ICD-10-CM | POA: Diagnosis not present

## 2022-10-31 DIAGNOSIS — E113511 Type 2 diabetes mellitus with proliferative diabetic retinopathy with macular edema, right eye: Secondary | ICD-10-CM | POA: Diagnosis not present

## 2022-11-02 DIAGNOSIS — N1831 Chronic kidney disease, stage 3a: Secondary | ICD-10-CM | POA: Diagnosis not present

## 2022-11-02 DIAGNOSIS — I5042 Chronic combined systolic (congestive) and diastolic (congestive) heart failure: Secondary | ICD-10-CM | POA: Diagnosis not present

## 2022-11-02 DIAGNOSIS — E1140 Type 2 diabetes mellitus with diabetic neuropathy, unspecified: Secondary | ICD-10-CM | POA: Diagnosis not present

## 2022-11-02 DIAGNOSIS — E1122 Type 2 diabetes mellitus with diabetic chronic kidney disease: Secondary | ICD-10-CM | POA: Diagnosis not present

## 2022-11-02 DIAGNOSIS — I130 Hypertensive heart and chronic kidney disease with heart failure and stage 1 through stage 4 chronic kidney disease, or unspecified chronic kidney disease: Secondary | ICD-10-CM | POA: Diagnosis not present

## 2022-11-02 DIAGNOSIS — E1159 Type 2 diabetes mellitus with other circulatory complications: Secondary | ICD-10-CM | POA: Diagnosis not present

## 2022-11-02 DIAGNOSIS — D539 Nutritional anemia, unspecified: Secondary | ICD-10-CM | POA: Diagnosis not present

## 2022-11-02 DIAGNOSIS — E785 Hyperlipidemia, unspecified: Secondary | ICD-10-CM | POA: Diagnosis not present

## 2022-11-02 DIAGNOSIS — Z4781 Encounter for orthopedic aftercare following surgical amputation: Secondary | ICD-10-CM | POA: Diagnosis not present

## 2022-11-06 ENCOUNTER — Other Ambulatory Visit: Payer: Self-pay | Admitting: Internal Medicine

## 2022-11-06 DIAGNOSIS — E1149 Type 2 diabetes mellitus with other diabetic neurological complication: Secondary | ICD-10-CM

## 2022-11-08 DIAGNOSIS — E785 Hyperlipidemia, unspecified: Secondary | ICD-10-CM | POA: Diagnosis not present

## 2022-11-08 DIAGNOSIS — E1140 Type 2 diabetes mellitus with diabetic neuropathy, unspecified: Secondary | ICD-10-CM | POA: Diagnosis not present

## 2022-11-08 DIAGNOSIS — E1159 Type 2 diabetes mellitus with other circulatory complications: Secondary | ICD-10-CM | POA: Diagnosis not present

## 2022-11-08 DIAGNOSIS — N1831 Chronic kidney disease, stage 3a: Secondary | ICD-10-CM | POA: Diagnosis not present

## 2022-11-08 DIAGNOSIS — I130 Hypertensive heart and chronic kidney disease with heart failure and stage 1 through stage 4 chronic kidney disease, or unspecified chronic kidney disease: Secondary | ICD-10-CM | POA: Diagnosis not present

## 2022-11-08 DIAGNOSIS — D539 Nutritional anemia, unspecified: Secondary | ICD-10-CM | POA: Diagnosis not present

## 2022-11-08 DIAGNOSIS — Z4781 Encounter for orthopedic aftercare following surgical amputation: Secondary | ICD-10-CM | POA: Diagnosis not present

## 2022-11-08 DIAGNOSIS — E1122 Type 2 diabetes mellitus with diabetic chronic kidney disease: Secondary | ICD-10-CM | POA: Diagnosis not present

## 2022-11-08 DIAGNOSIS — I5042 Chronic combined systolic (congestive) and diastolic (congestive) heart failure: Secondary | ICD-10-CM | POA: Diagnosis not present

## 2022-11-09 ENCOUNTER — Encounter: Payer: Self-pay | Admitting: Family

## 2022-11-09 ENCOUNTER — Ambulatory Visit (INDEPENDENT_AMBULATORY_CARE_PROVIDER_SITE_OTHER): Payer: Medicare HMO | Admitting: Family

## 2022-11-09 DIAGNOSIS — Z89432 Acquired absence of left foot: Secondary | ICD-10-CM

## 2022-11-09 NOTE — Progress Notes (Signed)
   Post-Op Visit Note   Patient: Steven Cochran           Date of Birth: 08/05/1970           MRN: [id number] Visit Date: 11/09/2022 PCP: Gwenevere Abbot, MD  Chief Complaint:  Chief Complaint  Patient presents with   Left Foot - Routine Post Op    09/26/22 left 5th ray amputation    HPI:  HPI The patient is a 52 year old gentleman seen status post left fifth ray amputation May 22 he has been doing dry dressing changes he states he is received some dressing change supplies in the mail.  He states he is ready to return to work Ortho Exam On examination left fifth ray this is nearly fully healed proximally there is 1 remaining open area 5 mm in diameter with flat pink tissue 1 drop of serous drainage no sign of infection sutures harvested today without incident  Visit Diagnoses: No diagnosis found.  Plan: Continue current wound care dry dressing applied today.  Given a note to return to work for seated work  Follow-Up Instructions: Return in about 3 weeks (around 11/30/2022).   Imaging: No results found.  Orders:  No orders of the defined types were placed in this encounter.  No orders of the defined types were placed in this encounter.    PMFS History: Patient Active Problem List   Diagnosis Date Noted   Essential hypertension 09/27/2022   Osteomyelitis of fifth toe of left foot (HCC) 09/18/2022   Primary open angle glaucoma 08/29/2022   Preventive measure 07/15/2022   Hyperlipidemia associated with type 2 diabetes mellitus (HCC) 07/15/2022   Microcytic anemia 07/15/2022   Diabetic ulcer of left foot associated with type 2 diabetes mellitus (HCC) 06/14/2022   Embolic stroke (HCC) 09/02/2021   Type II diabetes mellitus with neurological manifestations (HCC) 09/02/2021   Chronic systolic CHF (congestive heart failure) (HCC) 09/02/2021   HTN (hypertension) 06/30/2012   Past Medical History:  Diagnosis Date   Cataract    CHF (congestive heart failure) (HCC)    Diabetes  mellitus    Hypertension     Family History  Problem Relation Age of Onset   Colon cancer Neg Hx    Esophageal cancer Neg Hx    Stomach cancer Neg Hx    Rectal cancer Neg Hx     Past Surgical History:  Procedure Laterality Date   AMPUTATION Left 09/26/2022   Procedure: LEFT FOOT 5TH RAY AMPUTATION;  Surgeon: Nadara Mustard, MD;  Location: [facility] OR;  Service: Orthopedics;  Laterality: Left;   CATARACT EXTRACTION Bilateral    SKIN GRAFT     feet   Social History   Occupational History   Not on file  Tobacco Use   Smoking status: Never   Smokeless tobacco: Never  Vaping Use   Vaping Use: Never used  Substance and Sexual Activity   Alcohol use: No   Drug use: No   Sexual activity: Not Currently    Partners: Female    Birth control/protection: None

## 2022-11-13 ENCOUNTER — Telehealth: Payer: Self-pay

## 2022-11-13 ENCOUNTER — Ambulatory Visit (AMBULATORY_SURGERY_CENTER): Payer: Medicare HMO

## 2022-11-13 VITALS — Ht 75.0 in | Wt 253.0 lb

## 2022-11-13 DIAGNOSIS — Z1211 Encounter for screening for malignant neoplasm of colon: Secondary | ICD-10-CM

## 2022-11-13 MED ORDER — PLENVU 140 G PO SOLR: 1.0000 | ORAL | 0 refills | Status: DC

## 2022-11-13 NOTE — Telephone Encounter (Signed)
Christina,  This pt is cleared for anesthetic care at [facility].  Thanks,  Jeny Nield 

## 2022-11-13 NOTE — Progress Notes (Signed)
No egg or soy allergy known to patient  No issues known to pt with past sedation with any surgeries or procedures Patient denies ever being told they had issues or difficulty with intubation  No FH of Malignant Hyperthermia Pt is not on diet pills Pt is not on  home 02  Pt is not on blood thinners  Pt denies issues with constipation  No A fib or A flutter Have any cardiac testing pending--no Ambulates independently. Pt instructed to use [email or web address] or GoodRx for a price reduction on prep   

## 2022-11-14 ENCOUNTER — Telehealth: Payer: Self-pay | Admitting: Gastroenterology

## 2022-11-14 ENCOUNTER — Other Ambulatory Visit: Payer: Self-pay

## 2022-11-14 DIAGNOSIS — Z1211 Encounter for screening for malignant neoplasm of colon: Secondary | ICD-10-CM

## 2022-11-14 MED ORDER — PEG 3350-KCL-NA BICARB-NACL 420 G PO SOLR: 4000.0000 mL | Freq: Once | ORAL | 0 refills | Status: AC

## 2022-11-14 NOTE — Telephone Encounter (Signed)
[facility] is calling to find out if the Plenvu can be replaced with [place] because insurance does not cover it. Please call to confirm. [phone]

## 2022-11-14 NOTE — Addendum Note (Signed)
Addended by: Glenford Bayley on: 11/14/2022 04:52 PM   Modules accepted: Orders

## 2022-11-14 NOTE — Telephone Encounter (Signed)
Patient was called and informed that a new Rx for goytely was sent to his pharmacy. New instructions were given in my chart.

## 2022-11-16 ENCOUNTER — Telehealth: Payer: Self-pay | Admitting: Orthopedic Surgery

## 2022-11-16 DIAGNOSIS — E1140 Type 2 diabetes mellitus with diabetic neuropathy, unspecified: Secondary | ICD-10-CM | POA: Diagnosis not present

## 2022-11-16 DIAGNOSIS — I5042 Chronic combined systolic (congestive) and diastolic (congestive) heart failure: Secondary | ICD-10-CM | POA: Diagnosis not present

## 2022-11-16 DIAGNOSIS — D539 Nutritional anemia, unspecified: Secondary | ICD-10-CM | POA: Diagnosis not present

## 2022-11-16 DIAGNOSIS — E1122 Type 2 diabetes mellitus with diabetic chronic kidney disease: Secondary | ICD-10-CM | POA: Diagnosis not present

## 2022-11-16 DIAGNOSIS — N1831 Chronic kidney disease, stage 3a: Secondary | ICD-10-CM | POA: Diagnosis not present

## 2022-11-16 DIAGNOSIS — I130 Hypertensive heart and chronic kidney disease with heart failure and stage 1 through stage 4 chronic kidney disease, or unspecified chronic kidney disease: Secondary | ICD-10-CM | POA: Diagnosis not present

## 2022-11-16 DIAGNOSIS — E1159 Type 2 diabetes mellitus with other circulatory complications: Secondary | ICD-10-CM | POA: Diagnosis not present

## 2022-11-16 DIAGNOSIS — E785 Hyperlipidemia, unspecified: Secondary | ICD-10-CM | POA: Diagnosis not present

## 2022-11-16 DIAGNOSIS — Z4781 Encounter for orthopedic aftercare following surgical amputation: Secondary | ICD-10-CM | POA: Diagnosis not present

## 2022-11-16 NOTE — Telephone Encounter (Signed)
FYI......Marland KitchenPt was discharged from HHPT today with [facility] and said he felt confident about it

## 2022-11-19 NOTE — Telephone Encounter (Signed)
 Noted  

## 2022-11-20 ENCOUNTER — Telehealth: Payer: Self-pay | Admitting: Gastroenterology

## 2022-11-20 NOTE — Telephone Encounter (Signed)
Called and spoke with patient. Pt stated that he could not find the instructions for the golytely, and that he did not have any dulcolax at home. He stated that he has no way of getting dulcolax, and wanted to know if he could take the stool softner that the hospital gave him instead. Spoke with MD and advised pt that he could take the miralax that he was given, and to start golytely at 5pm today. Instructed patient of what bowel prep results should look like, and gave him lec numbers to call if he has any further concerns.

## 2022-11-20 NOTE — Telephone Encounter (Signed)
Outbound call to patient , left voicemail to call us back to go over colon prep  instruction.

## 2022-11-20 NOTE — Telephone Encounter (Signed)
Inbound call from patient requesting a call back regarding prep medication or tomorrow's 7/17 procedure. States he thinks he did not receive all things that is needed from pharmacy. Please advise, thank you.

## 2022-11-21 ENCOUNTER — Ambulatory Visit (AMBULATORY_SURGERY_CENTER): Payer: Medicare HMO | Admitting: Gastroenterology

## 2022-11-21 ENCOUNTER — Encounter: Payer: Self-pay | Admitting: Gastroenterology

## 2022-11-21 VITALS — BP 130/82 | HR 67 | Temp 98.0°F | Resp 11 | Ht 74.5 in | Wt 253.0 lb

## 2022-11-21 DIAGNOSIS — E119 Type 2 diabetes mellitus without complications: Secondary | ICD-10-CM | POA: Diagnosis not present

## 2022-11-21 DIAGNOSIS — Z1211 Encounter for screening for malignant neoplasm of colon: Secondary | ICD-10-CM

## 2022-11-21 DIAGNOSIS — I10 Essential (primary) hypertension: Secondary | ICD-10-CM | POA: Diagnosis not present

## 2022-11-21 DIAGNOSIS — I509 Heart failure, unspecified: Secondary | ICD-10-CM | POA: Diagnosis not present

## 2022-11-21 MED ORDER — SODIUM CHLORIDE 0.9 % IV SOLN: 500.0000 mL | Freq: Once | INTRAVENOUS | Status: DC

## 2022-11-21 NOTE — Progress Notes (Signed)
Vitals-Steven Cochran  Pt's states no medical or surgical changes since previsit or office visit.  Patient is a fall risk.

## 2022-11-21 NOTE — Progress Notes (Signed)
[facility] History and Physical   Primary Care Physician:  Gwenevere Abbot, MD   Reason for Procedure:   Colon cancer screening  Plan:    Screening colonoscopy     HPI: Steven Cochran is a 52 y.o. male undergoing initial average risk screening colonoscopy.  He has no family history of colon cancer and no chronic GI symptoms.    Past Medical History:  Diagnosis Date   Cataract    CHF (congestive heart failure) (HCC)    Diabetes mellitus    Hypertension    Stroke [facility])     Past Surgical History:  Procedure Laterality Date   AMPUTATION Left 09/26/2022   Procedure: LEFT FOOT 5TH RAY AMPUTATION;  Surgeon: Nadara Mustard, MD;  Location: [facility] OR;  Service: Orthopedics;  Laterality: Left;   CATARACT EXTRACTION Bilateral    SKIN GRAFT     feet    Prior to Admission medications   Medication Sig Start Date End Date Taking? Authorizing Provider  amLODipine (NORVASC) 5 MG tablet Take 1 tablet (5 mg total) by mouth daily. 08/29/22  Yes Gwenevere Abbot, MD  aspirin EC 81 MG EC tablet Take 1 tablet (81 mg total) by mouth daily. Swallow whole. 09/04/21  Yes Leroy Sea, MD  Blood Glucose Monitoring Suppl (FREESTYLE LITE) w/Device KIT Use as directed 06/15/22  Yes Kc, Dayna Barker, MD  carvedilol (COREG) 25 MG tablet Take 1 tablet (25 mg total) by mouth 2 (two) times daily. 10/17/22 02/14/23 Yes Morene Crocker, MD  Continuous Glucose Sensor (FREESTYLE LIBRE 3 SENSOR) MISC 1 Device by Does not apply route every 14 (fourteen) days. Place 1 sensor on the skin every 14 days. Use to check glucose continuously 09/24/22 12/23/22 Yes Quincy Simmonds, MD  dorzolamide-timolol (COSOPT) 2-0.5 % ophthalmic solution Place 1 drop into both eyes 2 (two) times daily. 11/12/22  Yes [provider]  empagliflozin (JARDIANCE) 25 MG TABS tablet Take 1 tablet (25 mg total) by mouth daily. 10/17/22  Yes Morene Crocker, MD  glucose blood (FREESTYLE LITE) test strip Monitor blood sugar at home  06/15/22  Yes Kc, Dayna Barker, MD  insulin glargine (LANTUS) 100 UNIT/ML Solostar Pen Inject 15 Units into the skin daily. 09/28/22  Yes Lyndle Herrlich, MD  Insulin Pen Needle 32G X 4 MM MISC Use with Lantus 09/28/22  Yes Lyndle Herrlich, MD  Lancets (FREESTYLE) lancets Use a directed 06/15/22  Yes Kc, Ramesh, MD  sacubitril-valsartan (ENTRESTO) 97-103 MG Take 1 tablet by mouth 2 (two) times daily. 08/30/22 08/30/23 Yes Gwenevere Abbot, MD  spironolactone (ALDACTONE) 25 MG tablet Take 1/2 tablet (12.5 mg total) by mouth daily. 09/28/22 09/28/23 Yes Lyndle Herrlich, MD  rosuvastatin (CRESTOR) 40 MG tablet Take 1 tablet (40 mg total) by mouth daily. 10/17/22   Morene Crocker, MD  sulfamethoxazole-trimethoprim (BACTRIM DS) 800-160 MG tablet Take 1 tablet by mouth 2 (two) times daily. Patient not taking: Reported on 11/13/2022 10/19/22   Adonis Huguenin, NP    Current Outpatient Medications  Medication Sig Dispense Refill   amLODipine (NORVASC) 5 MG tablet Take 1 tablet (5 mg total) by mouth daily. 30 tablet 3   aspirin EC 81 MG EC tablet Take 1 tablet (81 mg total) by mouth daily. Swallow whole. 30 tablet 11   Blood Glucose Monitoring Suppl (FREESTYLE LITE) w/Device KIT Use as directed 1 kit 0   carvedilol (COREG) 25 MG tablet Take 1 tablet (25 mg total) by mouth 2 (two) times daily. 60 tablet 3   Continuous Glucose Sensor (  FREESTYLE LIBRE 3 SENSOR) MISC 1 Device by Does not apply route every 14 (fourteen) days. Place 1 sensor on the skin every 14 days. Use to check glucose continuously 2 each 2   dorzolamide-timolol (COSOPT) 2-0.5 % ophthalmic solution Place 1 drop into both eyes 2 (two) times daily.     empagliflozin (JARDIANCE) 25 MG TABS tablet Take 1 tablet (25 mg total) by mouth daily. 30 tablet 11   glucose blood (FREESTYLE LITE) test strip Monitor blood sugar at home 100 each 5   insulin glargine (LANTUS) 100 UNIT/ML Solostar Pen Inject 15 Units into the skin daily. 6 mL 1   Insulin  Pen Needle 32G X 4 MM MISC Use with Lantus 100 each 0   Lancets (FREESTYLE) lancets Use a directed 100 each 0   sacubitril-valsartan (ENTRESTO) 97-103 MG Take 1 tablet by mouth 2 (two) times daily. 60 tablet 11   spironolactone (ALDACTONE) 25 MG tablet Take 1/2 tablet (12.5 mg total) by mouth daily. 30 tablet 11   rosuvastatin (CRESTOR) 40 MG tablet Take 1 tablet (40 mg total) by mouth daily. 30 tablet 0   sulfamethoxazole-trimethoprim (BACTRIM DS) 800-160 MG tablet Take 1 tablet by mouth 2 (two) times daily. (Patient not taking: Reported on 11/13/2022) 20 tablet 0   Current Facility-Administered Medications  Medication Dose Route Frequency Provider Last Rate Last Admin   0.9 %  sodium chloride infusion  500 mL Intravenous Once Jenel Lucks, MD        Allergies as of 11/21/2022 - Review Complete 11/21/2022  Allergen Reaction Noted   Metformin and related Diarrhea 10/01/2019    Family History  Problem Relation Age of Onset   Colon cancer Neg Hx    Esophageal cancer Neg Hx    Stomach cancer Neg Hx    Rectal cancer Neg Hx     Social History   Socioeconomic History   Marital status: Single    Spouse name: Not on file   Number of children: Not on file   Years of education: Not on file   Highest education level: Not on file  Occupational History   Not on file  Tobacco Use   Smoking status: Never   Smokeless tobacco: Never  Vaping Use   Vaping status: Never Used  Substance and Sexual Activity   Alcohol use: No   Drug use: No   Sexual activity: Not Currently    Partners: Female    Birth control/protection: None  Other Topics Concern   Not on file  Social History Narrative   Not on file   Social Determinants of Health   Financial Resource Strain: Low Risk  (07/12/2022)   Overall Financial Resource Strain (CARDIA)    Difficulty of Paying Living Expenses: Not hard at all  Food Insecurity: No Food Insecurity (07/12/2022)   Hunger Vital Sign    Worried About Running Out  of Food in the Last Year: Never true    Ran Out of Food in the Last Year: Never true  Transportation Needs: Unmet Transportation Needs (07/12/2022)   PRAPARE - Transportation    Lack of Transportation (Medical): Yes    Lack of Transportation (Non-Medical): Yes  Physical Activity: Sufficiently Active (07/12/2022)   Exercise Vital Sign    Days of Exercise per Week: 5 days    Minutes of Exercise per Session: 40 min  Stress: No Stress Concern Present (07/12/2022)   [employer] of Occupational Health - Occupational Stress Questionnaire    Feeling of Stress :  Not at all  Social Connections: Socially Isolated (07/12/2022)   Social Connection and Isolation Panel [NHANES]    Frequency of Communication with Friends and Family: More than three times a week    Frequency of Social Gatherings with Friends and Family: Never    Attends Religious Services: Never    Database administrator or Organizations: No    Attends Banker Meetings: Never    Marital Status: Divorced  Catering manager Violence: Not At Risk (07/12/2022)   Humiliation, Afraid, Rape, and Kick questionnaire    Fear of Current or Ex-Partner: No    Emotionally Abused: No    Physically Abused: No    Sexually Abused: No    Review of Systems:  All other review of systems negative except as mentioned in the HPI.  Physical Exam: Vital signs BP 139/89 (BP Location: Left Arm, Patient Position: Sitting, Cuff Size: Normal)   Pulse 69   Temp 98 F (36.7 C) (Temporal)   Ht 6' 2.5" (1.892 m)   Wt 253 lb (114.8 kg)   SpO2 99%   BMI 32.05 kg/m   General:   Alert,  Well-developed, well-nourished, pleasant and cooperative in NAD Airway:  Mallampati 1 Lungs:  Clear throughout to auscultation.   Heart:  Regular rate and rhythm; no murmurs, clicks, rubs,  or gallops. Abdomen:  Soft, nontender and nondistended. Normal bowel sounds.   Neuro/Psych:  Normal mood and affect. A and O x 3   Perry Molla E. Tomasa Rand, MD [facility]  Gastroenterology

## 2022-11-21 NOTE — Patient Instructions (Signed)
Discharge instructions given. Normal exam. Resume previous medications. YOU HAD AN ENDOSCOPIC PROCEDURE TODAY AT THE [facility]:   Refer to the procedure report that was given to you for any specific questions about what was found during the examination.  If the procedure report does not answer your questions, please call your gastroenterologist to clarify.  If you requested that your care partner not be given the details of your procedure findings, then the procedure report has been included in a sealed envelope for you to review at your convenience later.  YOU SHOULD EXPECT: Some feelings of bloating in the abdomen. Passage of more gas than usual.  Walking can help get rid of the air that was put into your GI tract during the procedure and reduce the bloating. If you had a lower endoscopy (such as a colonoscopy or flexible sigmoidoscopy) you may notice spotting of blood in your stool or on the toilet paper. If you underwent a bowel prep for your procedure, you may not have a normal bowel movement for a few days.  Please Note:  You might notice some irritation and congestion in your nose or some drainage.  This is from the oxygen used during your procedure.  There is no need for concern and it should clear up in a day or so.  SYMPTOMS TO REPORT IMMEDIATELY:  Following lower endoscopy (colonoscopy or flexible sigmoidoscopy):  Excessive amounts of blood in the stool  Significant tenderness or worsening of abdominal pains  Swelling of the abdomen that is new, acute  Fever of 100F or higher   For urgent or emergent issues, a gastroenterologist can be reached at any hour by calling [phone]. Do not use MyChart messaging for urgent concerns.    DIET:  We do recommend a small meal at first, but then you may proceed to your regular diet.  Drink plenty of fluids but you should avoid alcoholic beverages for 24 hours.  ACTIVITY:  You should plan to take it easy for the rest of  today and you should NOT DRIVE or use heavy machinery until tomorrow (because of the sedation medicines used during the test).    FOLLOW UP: Our staff will call the number listed on your records the next business day following your procedure.  We will call around 7:15- 8:00 am to check on you and address any questions or concerns that you may have regarding the information given to you following your procedure. If we do not reach you, we will leave a message.     If any biopsies were taken you will be contacted by phone or by letter within the next 1-3 weeks.  Please call us at [phone] if you have not heard about the biopsies in 3 weeks.    SIGNATURES/CONFIDENTIALITY: You and/or your care partner have signed paperwork which will be entered into your electronic medical record.  These signatures attest to the fact that that the information above on your After Visit Summary has been reviewed and is understood.  Full responsibility of the confidentiality of this discharge information lies with you and/or your care-partner. 

## 2022-11-21 NOTE — Op Note (Signed)
[facility] Patient Name: Steven Cochran Procedure Date: 11/21/2022 3:23 PM MRN: [id number] Endoscopist: Lorin Picket E. Tomasa Rand , MD, [id number] Age: 52 Referring MD:  Date of Birth: [birth year] Gender: Male Account #: [id number] Procedure:                Colonoscopy Indications:              Screening for colorectal malignant neoplasm, This                            is the patient's first colonoscopy Medicines:                Monitored Anesthesia Care Procedure:                Pre-Anesthesia Assessment:                           - Prior to the procedure, a History and Physical                            was performed, and patient medications and                            allergies were reviewed. The patient's tolerance of                            previous anesthesia was also reviewed. The risks                            and benefits of the procedure and the sedation                            options and risks were discussed with the patient.                            All questions were answered, and informed consent                            was obtained. Prior Anticoagulants: The patient has                            taken no anticoagulant or antiplatelet agents. ASA                            Grade Assessment: III - A patient with severe                            systemic disease. After reviewing the risks and                            benefits, the patient was deemed in satisfactory                            condition to undergo the procedure.  After obtaining informed consent, the colonoscope                            was passed under direct vision. Throughout the                            procedure, the patient's blood pressure, pulse, and                            oxygen saturations were monitored continuously. The                            Olympus Scope SN: [id number] was introduced through                            the anus and  advanced to the the terminal ileum,                            with identification of the appendiceal orifice and                            IC valve. The colonoscopy was performed without                            difficulty. The patient tolerated the procedure                            well. The quality of the bowel preparation was                            fair. The terminal ileum, ileocecal valve,                            appendiceal orifice, and rectum were photographed.                            The bowel preparation used was Plenvu via split                            dose instruction. Scope In: 3:31:04 PM Scope Out: 3:47:33 PM Scope Withdrawal Time: 0 hours 11 minutes 57 seconds  Total Procedure Duration: 0 hours 16 minutes 29 seconds  Findings:                 The perianal and digital rectal examinations were                            normal. Pertinent negatives include normal                            sphincter tone and no palpable rectal lesions.                           The colon (entire examined portion) appeared normal.  The terminal ileum appeared normal.                           The retroflexed view of the distal rectum and anal                            verge was normal and showed no anal or rectal                            abnormalities. Complications:            No immediate complications. Estimated Blood Loss:     Estimated blood loss: none. Estimated blood loss:                            none. Impression:               - Preparation of the colon was fair.                           - The entire examined colon is normal.                           - The examined portion of the ileum was normal.                           - The distal rectum and anal verge are normal on                            retroflexion view.                           - No specimens collected. Recommendation:           - Patient has a contact number available  for                            emergencies. The signs and symptoms of potential                            delayed complications were discussed with the                            patient. Return to normal activities tomorrow.                            Written discharge instructions were provided to the                            patient.                           - Resume previous diet.                           - Continue present medications.                           -  Repeat colonoscopy in 3 years because the bowel                            preparation was suboptimal. Omarr Hann E. Tomasa Rand, MD 11/21/2022 4:05:11 PM This report has been signed electronically.

## 2022-11-21 NOTE — Progress Notes (Signed)
Uneventful propofol anesthetic. Report to pacu rn. Vss. Care resumed by rn.

## 2022-11-22 ENCOUNTER — Telehealth (HOSPITAL_COMMUNITY): Payer: Self-pay | Admitting: Vascular Surgery

## 2022-11-22 ENCOUNTER — Telehealth: Payer: Self-pay

## 2022-11-22 NOTE — Telephone Encounter (Signed)
Follow up call to pt, lm for pt to call if having any difficulty with normal activities or eating and drinking.  Also to call if any other questions or concerns.  

## 2022-11-22 NOTE — Telephone Encounter (Signed)
Lvm to make new [facility] appt

## 2022-12-05 ENCOUNTER — Encounter (HOSPITAL_COMMUNITY): Payer: Self-pay | Admitting: Emergency Medicine

## 2022-12-05 ENCOUNTER — Emergency Department (HOSPITAL_COMMUNITY)
Admission: EM | Admit: 2022-12-05 | Discharge: 2022-12-06 | Disposition: A | Discharge status: Home / Self Care | Payer: Medicare HMO | Attending: Emergency Medicine | Admitting: Emergency Medicine

## 2022-12-05 ENCOUNTER — Emergency Department (HOSPITAL_COMMUNITY): Payer: Medicare HMO

## 2022-12-05 ENCOUNTER — Other Ambulatory Visit: Payer: Self-pay

## 2022-12-05 ENCOUNTER — Other Ambulatory Visit: Payer: Self-pay | Admitting: Student

## 2022-12-05 DIAGNOSIS — Z89422 Acquired absence of other left toe(s): Secondary | ICD-10-CM | POA: Diagnosis not present

## 2022-12-05 DIAGNOSIS — Z7982 Long term (current) use of aspirin: Secondary | ICD-10-CM | POA: Insufficient documentation

## 2022-12-05 DIAGNOSIS — I509 Heart failure, unspecified: Secondary | ICD-10-CM | POA: Diagnosis not present

## 2022-12-05 DIAGNOSIS — E1140 Type 2 diabetes mellitus with diabetic neuropathy, unspecified: Secondary | ICD-10-CM | POA: Diagnosis not present

## 2022-12-05 DIAGNOSIS — Z794 Long term (current) use of insulin: Secondary | ICD-10-CM | POA: Insufficient documentation

## 2022-12-05 DIAGNOSIS — R2242 Localized swelling, mass and lump, left lower limb: Secondary | ICD-10-CM | POA: Diagnosis not present

## 2022-12-05 DIAGNOSIS — M7732 Calcaneal spur, left foot: Secondary | ICD-10-CM | POA: Diagnosis not present

## 2022-12-05 DIAGNOSIS — M7989 Other specified soft tissue disorders: Secondary | ICD-10-CM | POA: Diagnosis not present

## 2022-12-05 DIAGNOSIS — E785 Hyperlipidemia, unspecified: Secondary | ICD-10-CM

## 2022-12-05 DIAGNOSIS — M19072 Primary osteoarthritis, left ankle and foot: Secondary | ICD-10-CM | POA: Diagnosis not present

## 2022-12-05 NOTE — ED Triage Notes (Addendum)
Patient here complains of swelling to the left foot. States yesterday when out in the rain his feet got wet and had to change the bandages on his left foot due to having his little toe amputated in May. States the site looked clean but just noticed this afternoon his ankle and foot are swollen. Denies any recent trauma, fevers, chills.

## 2022-12-06 DIAGNOSIS — M19072 Primary osteoarthritis, left ankle and foot: Secondary | ICD-10-CM | POA: Diagnosis not present

## 2022-12-06 DIAGNOSIS — M7989 Other specified soft tissue disorders: Secondary | ICD-10-CM | POA: Diagnosis not present

## 2022-12-06 DIAGNOSIS — Z89422 Acquired absence of other left toe(s): Secondary | ICD-10-CM | POA: Diagnosis not present

## 2022-12-06 DIAGNOSIS — M7732 Calcaneal spur, left foot: Secondary | ICD-10-CM | POA: Diagnosis not present

## 2022-12-06 NOTE — ED Provider Notes (Signed)
[place] EMERGENCY DEPARTMENT AT [facility] Provider Note   CSN: [id number] Arrival date & time: 12/05/22  2243     History  Chief Complaint  Patient presents with   Foot Swelling    Steven Cochran is a 52 y.o. male with history of diabetes status post left fifth toe amputation in May of this year who presents with soft tissue swelling of the left foot.  States that he was walking outside approximately 1 mile home from the store when it began raining 48 hours ago.  States he got home, immediately changed the dressing to his foot.  Woke up yesterday with some soft tissue swelling of the foot.  No redness swelling pain in the area, no fevers or chills.  No discharge from the incision site.  I personally reviewed medical records.  History of CHF, embolic stroke, type 2 diabetes, neuropathy from diabetes as well.  Patient is not anticoagulated.  HPI     Home Medications Prior to Admission medications   Medication Sig Start Date End Date Taking? Authorizing Provider  amLODipine (NORVASC) 5 MG tablet Take 1 tablet (5 mg total) by mouth daily. 08/29/22   Gwenevere Abbot, MD  aspirin EC 81 MG EC tablet Take 1 tablet (81 mg total) by mouth daily. Swallow whole. 09/04/21   Leroy Sea, MD  Blood Glucose Monitoring Suppl (FREESTYLE LITE) w/Device KIT Use as directed 06/15/22   Lanae Boast, MD  carvedilol (COREG) 25 MG tablet Take 1 tablet (25 mg total) by mouth 2 (two) times daily. 10/17/22 02/14/23  Morene Crocker, MD  Continuous Glucose Sensor (FREESTYLE LIBRE 3 SENSOR) MISC 1 Device by Does not apply route every 14 (fourteen) days. Place 1 sensor on the skin every 14 days. Use to check glucose continuously 09/24/22 12/23/22  Quincy Simmonds, MD  dorzolamide-timolol (COSOPT) 2-0.5 % ophthalmic solution Place 1 drop into both eyes 2 (two) times daily. 11/12/22   [provider]  empagliflozin (JARDIANCE) 25 MG TABS tablet Take 1 tablet (25 mg total) by mouth daily. 10/17/22    Morene Crocker, MD  glucose blood (FREESTYLE LITE) test strip Monitor blood sugar at home 06/15/22   Lanae Boast, MD  insulin glargine (LANTUS) 100 UNIT/ML Solostar Pen Inject 15 Units into the skin daily. 09/28/22   Lyndle Herrlich, MD  Insulin Pen Needle 32G X 4 MM MISC Use with Lantus 09/28/22   Lyndle Herrlich, MD  Lancets (FREESTYLE) lancets Use a directed 06/15/22   Lanae Boast, MD  rosuvastatin (CRESTOR) 40 MG tablet Take 1 tablet (40 mg total) by mouth daily. 10/17/22   Morene Crocker, MD  sacubitril-valsartan (ENTRESTO) 97-103 MG Take 1 tablet by mouth 2 (two) times daily. 08/30/22 08/30/23  Gwenevere Abbot, MD  spironolactone (ALDACTONE) 25 MG tablet Take 1/2 tablet (12.5 mg total) by mouth daily. 09/28/22 09/28/23  Lyndle Herrlich, MD  sulfamethoxazole-trimethoprim (BACTRIM DS) 800-160 MG tablet Take 1 tablet by mouth 2 (two) times daily. Patient not taking: Reported on 11/13/2022 10/19/22   Adonis Huguenin, NP      Allergies    Metformin and related    Review of Systems   Review of Systems  Musculoskeletal:        Swelling over the left foot.     Physical Exam Updated Vital Signs BP (!) 177/101   Pulse 66   Temp 97.9 F (36.6 C) (Oral)   Resp 18   Ht 6\' 2"  (1.88 m)   Wt 114 kg   SpO2 100%  BMI 32.27 kg/m  Physical Exam Vitals and nursing note reviewed.  Constitutional:      Appearance: He is not ill-appearing or toxic-appearing.  HENT:     Head: Normocephalic and atraumatic.     Mouth/Throat:     Mouth: Mucous membranes are moist.     Pharynx: No oropharyngeal exudate or posterior oropharyngeal erythema.  Eyes:     General:        Right eye: No discharge.        Left eye: No discharge.     Conjunctiva/sclera: Conjunctivae normal.  Cardiovascular:     Rate and Rhythm: Normal rate and regular rhythm.     Pulses: Normal pulses.     Heart sounds: Normal heart sounds. No murmur heard. Pulmonary:     Effort: Pulmonary effort is normal. No  respiratory distress.     Breath sounds: Normal breath sounds. No wheezing or rales.  Abdominal:     General: Bowel sounds are normal. There is no distension.     Palpations: Abdomen is soft.     Tenderness: There is no abdominal tenderness. There is no guarding or rebound.  Musculoskeletal:        General: No deformity.     Cervical back: Neck supple.       Feet:  Feet:     Comments: Soft tissue swelling of the left ankle and foot without erythema, induration, warmth to the touch.  No purulent discharge from surgical site, some crusted serous drainage which per chart review has been present for patient since surgery.  Tenderness palpation, the patient does have neuropathy. Skin:    General: Skin is warm and dry.     Capillary Refill: Capillary refill takes less than 2 seconds.  Neurological:     General: No focal deficit present.     Mental Status: He is alert and oriented to person, place, and time. Mental status is at baseline.  Psychiatric:        Mood and Affect: Mood normal.     ED Results / Procedures / Treatments   Labs (all labs ordered are listed, but only abnormal results are displayed) Labs Reviewed  COMPREHENSIVE METABOLIC PANEL - Abnormal; Notable for the following components:      Result Value   Glucose, Bld 120 (*)    BUN 38 (*)    Creatinine, Ser 1.51 (*)    GFR, Estimated 56 (*)    All other components within normal limits  CBC WITH DIFFERENTIAL/PLATELET - Abnormal; Notable for the following components:   Hemoglobin 9.1 (*)    HCT 30.1 (*)    MCV 70.7 (*)    MCH 21.4 (*)    RDW 16.4 (*)    All other components within normal limits    EKG None  Radiology DG Foot Complete Left  Result Date: 12/06/2022 CLINICAL DATA:  Left foot and ankle swelling. EXAM: LEFT FOOT - COMPLETE 3+ VIEW; LEFT ANKLE COMPLETE - 3+ VIEW COMPARISON:  07/03/2016, 09/18/2022 FINDINGS: There is no evidence of fracture or dislocation. No periosteal elevation or bony erosions. There  has been transmetatarsal amputation of the fifth ray. A stable projects over the first and second digit which may be external to the patient. Degenerative changes are present at the first metatarsophalangeal joint, ankle and midfoot. There is a large calcaneal spur. Vascular calcifications are noted. Soft tissue swelling is present about the ankle and dorsum of the foot. IMPRESSION: 1. No acute osseous abnormality. 2. Transmetatarsal amputation of the  fifth ray. 3. Scattered degenerative changes. Electronically Signed   By: Thornell Sartorius M.D.   On: 12/06/2022 00:33   DG Ankle Complete Left  Result Date: 12/06/2022 CLINICAL DATA:  Left foot and ankle swelling. EXAM: LEFT FOOT - COMPLETE 3+ VIEW; LEFT ANKLE COMPLETE - 3+ VIEW COMPARISON:  07/03/2016, 09/18/2022 FINDINGS: There is no evidence of fracture or dislocation. No periosteal elevation or bony erosions. There has been transmetatarsal amputation of the fifth ray. A stable projects over the first and second digit which may be external to the patient. Degenerative changes are present at the first metatarsophalangeal joint, ankle and midfoot. There is a large calcaneal spur. Vascular calcifications are noted. Soft tissue swelling is present about the ankle and dorsum of the foot. IMPRESSION: 1. No acute osseous abnormality. 2. Transmetatarsal amputation of the fifth ray. 3. Scattered degenerative changes. Electronically Signed   By: Thornell Sartorius M.D.   On: 12/06/2022 00:33    Procedures Procedures    Medications Ordered in ED Medications - No data to display  ED Course/ Medical Decision Making/ A&P                                 Medical Decision Making  52 year old male who presents with swelling to the left ankle and foot after walking long distance outside. Hypertensive on intake, vital signs otherwise normal.  Patient with 1+ DP pulses bilaterally.  Normal capillary refill and the first through fourth toes in the left foot, status post fifth  toe amputation in May of this year.  Soft tissue swelling present over the dorsum of the foot and ankle without erythema induration, warmth to the touch, or purulent discharge.  Amount and/or Complexity of Data Reviewed Labs: ordered. Radiology: ordered.  Clinical concern for infection as etiology with the patient symptoms is exceedingly low.  Favor etiology of swelling as dependence from prolonged walking which is atypical for patient.  Recommend continued wrapping of the foot, elevation of the foot, follow-up outpatient with his surgeon Dr. Lajoyce Corners and with his PCP.  Strict return precautions are given.  Clinical concern for emergent underlying etiology that would warrant further ED workup or inpatient management is exceedingly low.  Freida Busman voiced understanding of his medical evaluation and treatment plan. Each of their questions answered to their expressed satisfaction.  Return precautions were given.  Patient is well-appearing, stable, and was discharged in good condition.  This chart was dictated using voice recognition software, Dragon. Despite the best efforts of this provider to proofread and correct errors, errors may still occur which can change documentation meaning.  Final Clinical Impression(s) / ED Diagnoses Final diagnoses:  Foot swelling    Rx / DC Orders ED Discharge Orders     None         Sherrilee Gilles 12/06/22 [id number]    Glynn Octave, MD 12/06/22 [phone]

## 2022-12-06 NOTE — Discharge Instructions (Signed)
Your physical exam is reassuring as well as your blood work.  There are no signs of infection of your foot today.  Please keep the foot elevated, continue to wrap it as recommended by wound care and/or orthopedic surgeon.  Follow-up with the surgeon in the outpatient office and return to the ER with any new severe symptoms.

## 2022-12-13 ENCOUNTER — Telehealth: Payer: Self-pay

## 2022-12-13 NOTE — Telephone Encounter (Signed)
Transition Care Management Unsuccessful Follow-up Telephone Call  Date of discharge and from where:  [facility] 8/1  Attempts:  2nd Attempt  Reason for unsuccessful TCM follow-up call:  No answer/busy   Steven Cochran [facility] Guide, [facility] [phone] [address] [place], [place], [place] [address] Phone: [phone] Email: [email or web address]

## 2022-12-13 NOTE — Telephone Encounter (Signed)
Transition Care Management Unsuccessful Follow-up Telephone Call  Date of discharge and from where:  [facility] 8/1  Attempts:  1st Attempt  Reason for unsuccessful TCM follow-up call:  No answer/busy   [name] [facility] Guide, [facility] [phone] [address] [place], [place], [place] [address] Phone: [phone] Email: [email or web address]

## 2022-12-18 ENCOUNTER — Other Ambulatory Visit: Payer: Self-pay

## 2022-12-18 DIAGNOSIS — I5022 Chronic systolic (congestive) heart failure: Secondary | ICD-10-CM

## 2022-12-18 DIAGNOSIS — I10 Essential (primary) hypertension: Secondary | ICD-10-CM

## 2022-12-19 MED ORDER — CARVEDILOL 25 MG PO TABS
25.0000 mg | ORAL_TABLET | Freq: Two times a day (BID) | ORAL | 3 refills | Status: DC
Start: 2022-12-19 — End: 2022-12-30

## 2022-12-19 MED ORDER — AMLODIPINE BESYLATE 5 MG PO TABS
5.0000 mg | ORAL_TABLET | Freq: Every day | ORAL | 3 refills | Status: DC
Start: 2022-12-19 — End: 2022-12-30

## 2022-12-19 MED ORDER — ENTRESTO 97-103 MG PO TABS
1.0000 | ORAL_TABLET | Freq: Two times a day (BID) | ORAL | 11 refills | Status: DC
Start: 2022-12-19 — End: 2022-12-30

## 2022-12-19 MED ORDER — EMPAGLIFLOZIN 25 MG PO TABS
25.0000 mg | ORAL_TABLET | Freq: Every day | ORAL | 11 refills | Status: DC
Start: 2022-12-19 — End: 2022-12-30

## 2022-12-19 MED ORDER — SPIRONOLACTONE 25 MG PO TABS: 12.5000 mg | ORAL_TABLET | Freq: Every day | ORAL | 11 refills | Status: DC

## 2022-12-25 NOTE — Telephone Encounter (Signed)
Hey Dr.Khan you sent these rx to an alternative pharmacy, the rx were supposed to be sent to [facility] pharmacy.

## 2022-12-30 ENCOUNTER — Other Ambulatory Visit: Payer: Self-pay | Admitting: Internal Medicine

## 2022-12-30 DIAGNOSIS — I5022 Chronic systolic (congestive) heart failure: Secondary | ICD-10-CM

## 2022-12-30 DIAGNOSIS — I10 Essential (primary) hypertension: Secondary | ICD-10-CM

## 2022-12-30 MED ORDER — ENTRESTO 97-103 MG PO TABS
1.0000 | ORAL_TABLET | Freq: Two times a day (BID) | ORAL | 11 refills | Status: DC
Start: 2022-12-30 — End: 2023-01-23

## 2022-12-30 MED ORDER — SPIRONOLACTONE 25 MG PO TABS
12.5000 mg | ORAL_TABLET | Freq: Every day | ORAL | 11 refills | Status: DC
Start: 2022-12-30 — End: 2023-01-14

## 2022-12-30 MED ORDER — CARVEDILOL 25 MG PO TABS: 25.0000 mg | ORAL_TABLET | Freq: Two times a day (BID) | ORAL | 3 refills | Status: DC

## 2022-12-30 MED ORDER — AMLODIPINE BESYLATE 5 MG PO TABS
5.0000 mg | ORAL_TABLET | Freq: Every day | ORAL | 3 refills | Status: DC
Start: 2022-12-30 — End: 2023-03-12

## 2022-12-30 MED ORDER — EMPAGLIFLOZIN 25 MG PO TABS
25.0000 mg | ORAL_TABLET | Freq: Every day | ORAL | 11 refills | Status: AC
Start: 2022-12-30 — End: ?

## 2022-12-30 NOTE — Progress Notes (Deleted)
   CC: PCP follow up   HPI:  Mr.Heberto Silvio is a 52 y.o. with medical history of CHF, HTN, HLD, DMII, and hx of stroke presenting to [facility] for a PCP follow up.  Please see problem-based list for further details, assessments, and plans.  Past Medical History:  Diagnosis Date   Cataract    CHF (congestive heart failure) (HCC)    Diabetes mellitus    Hypertension    Stroke [facility])     Current Outpatient Medications (Endocrine & Metabolic):    empagliflozin (JARDIANCE) 25 MG TABS tablet, Take 1 tablet (25 mg total) by mouth daily.   insulin glargine (LANTUS) 100 UNIT/ML Solostar Pen, Inject 15 Units into the skin daily.  Current Outpatient Medications (Cardiovascular):    amLODipine (NORVASC) 5 MG tablet, Take 1 tablet (5 mg total) by mouth daily.   carvedilol (COREG) 25 MG tablet, Take 1 tablet (25 mg total) by mouth 2 (two) times daily.   rosuvastatin (CRESTOR) 40 MG tablet, Take 1 tablet by mouth once daily   sacubitril-valsartan (ENTRESTO) 97-103 MG, Take 1 tablet by mouth 2 (two) times daily.   spironolactone (ALDACTONE) 25 MG tablet, Take 1/2 tablet (12.5 mg total) by mouth daily.   Current Outpatient Medications (Analgesics):    aspirin EC 81 MG EC tablet, Take 1 tablet (81 mg total) by mouth daily. Swallow whole.   Current Outpatient Medications (Other):    Blood Glucose Monitoring Suppl (FREESTYLE LITE) w/Device KIT, Use as directed   dorzolamide-timolol (COSOPT) 2-0.5 % ophthalmic solution, Place 1 drop into both eyes 2 (two) times daily.   glucose blood (FREESTYLE LITE) test strip, Monitor blood sugar at home   Insulin Pen Needle 32G X 4 MM MISC, Use with Lantus   Lancets (FREESTYLE) lancets, Use a directed   sulfamethoxazole-trimethoprim (BACTRIM DS) 800-160 MG tablet, Take 1 tablet by mouth 2 (two) times daily. (Patient not taking: Reported on 11/13/2022)  Review of Systems:  Review of system negative unless stated in the problem list or HPI.    Physical  Exam:  There were no vitals filed for this visit. Physical Exam General: NAD HENT: NCAT Lungs: CTAB, no wheeze, rhonchi or rales.  Cardiovascular: Normal heart sounds, no r/m/g, 2+ pulses in all extremities. No LE edema Abdomen: No TTP, normal bowel sounds MSK: No asymmetry or muscle atrophy.  Skin: no lesions noted on exposed skin Neuro: Alert and oriented x4. CN grossly intact Psych: Normal mood and normal affect   Assessment & Plan:   No problem-specific Assessment & Plan notes found for this encounter.   See Encounters Tab for problem based charting.  Patient Discussed with Dr. {NAMES:[id number]::"Guilloud","Hoffman","Mullen","Narendra","Vincent","Machen","Lau","Hatcher","Williams"} Gwenevere Abbot, MD Eligha Bridegroom. [facility] Internal Medicine Residency, PGY-3   CHF Echo in 45 % in 08/2021. On Coreg 25 mg BID, and Entresto 97-103 mg BID, Spiro 12.5 mg every day, Jardiance 25 mg every day.  HTN On GDMT and amlodipine 5 mg every day. Cr 1.51 in 12/2022. Cr baseline around 1.4  DMII:  On Jardiance 25 mg every day, and insulin 15 units daily. Ozempic. A1c 8.5 in 08/2022. A/c ratio 2000. Repeat A1c this visit.   HLD On Crestor 40 mg every day. LDL 123 in 07/3032. Repeat this visit.   Hyperkalemia at 5.1. Repeat BMP this visit.

## 2022-12-31 ENCOUNTER — Encounter: Payer: Medicare HMO | Admitting: Internal Medicine

## 2022-12-31 ENCOUNTER — Other Ambulatory Visit: Payer: Self-pay | Admitting: Internal Medicine

## 2022-12-31 DIAGNOSIS — E785 Hyperlipidemia, unspecified: Secondary | ICD-10-CM

## 2023-01-01 ENCOUNTER — Encounter (HOSPITAL_COMMUNITY): Payer: Self-pay | Admitting: Cardiology

## 2023-01-01 ENCOUNTER — Ambulatory Visit (HOSPITAL_COMMUNITY)
Admission: RE | Admit: 2023-01-01 | Discharge: 2023-01-01 | Disposition: A | Discharge status: Home / Self Care | Payer: Medicare HMO | Source: Ambulatory Visit | Attending: Cardiology | Admitting: Cardiology

## 2023-01-01 VITALS — BP 112/78 | HR 71 | Wt 258.8 lb

## 2023-01-01 DIAGNOSIS — I110 Hypertensive heart disease with heart failure: Secondary | ICD-10-CM | POA: Insufficient documentation

## 2023-01-01 DIAGNOSIS — I10 Essential (primary) hypertension: Secondary | ICD-10-CM | POA: Diagnosis not present

## 2023-01-01 DIAGNOSIS — I5022 Chronic systolic (congestive) heart failure: Secondary | ICD-10-CM | POA: Diagnosis not present

## 2023-01-01 DIAGNOSIS — R9431 Abnormal electrocardiogram [ECG] [EKG]: Secondary | ICD-10-CM | POA: Insufficient documentation

## 2023-01-01 DIAGNOSIS — E1149 Type 2 diabetes mellitus with other diabetic neurological complication: Secondary | ICD-10-CM

## 2023-01-01 LAB — BASIC METABOLIC PANEL
Anion gap: 7 (ref 5–15)
BUN: 14 mg/dL (ref 6–20)
CO2: 26 mmol/L (ref 22–32)
Calcium: 8.9 mg/dL (ref 8.9–10.3)
Chloride: 105 mmol/L (ref 98–111)
Creatinine, Ser: 1.38 mg/dL — ABNORMAL HIGH (ref 0.61–1.24)
GFR, Estimated: >60 mL/min (ref >60)
Glucose, Bld: 131 mg/dL — ABNORMAL HIGH (ref 70–99)
Potassium: 4.1 mmol/L (ref 3.5–5.1)
Sodium: 138 mmol/L (ref 135–145)

## 2023-01-01 LAB — BRAIN NATRIURETIC PEPTIDE: B Natriuretic Peptide: 77.4 pg/mL (ref 0.0–100.0)

## 2023-01-01 NOTE — Progress Notes (Signed)
ADVANCED HEART FAILURE CLINIC NOTE  Referring Physician: Tyson Alias,*  Primary Care: Gwenevere Abbot, MD Primary Cardiologist:  HPI: Steven Cochran is a 52 y.o. male with type 2 diabetes status post left fifth toe amputation (5/24), heart failure with reduced ejection fraction, history of embolic stroke, diabetic neuropathy presenting to reestablish care.  Based on chart review he has had a history of heart failure dating back to at least late 2020.  He was previously followed at [facility] by Dr. Onalee Hua.  Echocardiogram from December/2020 with LVEF as low as 15 to 20%.  He was started on GDMT w with improvement in EF in 2023 to 40 to 45%.  He is now very active; reports cycling up to to an hour twice weekly in a class. He has continued to take medications as prescribed.   Activity level/exercise tolerance:  NYHA II Orthopnea:  Sleeps on 2 pillows Paroxysmal noctural dyspnea:  no Chest pain/pressure:  no Orthostatic lightheadedness:  no Palpitations:  no Lower extremity edema:  no Presyncope/syncope:  no Cough:  no  Past Medical History:  Diagnosis Date   Cataract    CHF (congestive heart failure) (HCC)    Diabetes mellitus    Hypertension    Stroke [facility])     Current Outpatient Medications  Medication Sig Dispense Refill   amLODipine (NORVASC) 5 MG tablet Take 1 tablet (5 mg total) by mouth daily. 30 tablet 3   aspirin EC 81 MG EC tablet Take 1 tablet (81 mg total) by mouth daily. Swallow whole. 30 tablet 11   Blood Glucose Monitoring Suppl (FREESTYLE LITE) w/Device KIT Use as directed 1 kit 0   carvedilol (COREG) 25 MG tablet Take 1 tablet (25 mg total) by mouth 2 (two) times daily. 60 tablet 3   dorzolamide-timolol (COSOPT) 2-0.5 % ophthalmic solution Place 1 drop into both eyes 2 (two) times daily.     empagliflozin (JARDIANCE) 25 MG TABS tablet Take 1 tablet (25 mg total) by mouth daily. 30 tablet 11   glucose blood (FREESTYLE LITE) test strip  Monitor blood sugar at home 100 each 5   insulin glargine (LANTUS) 100 UNIT/ML Solostar Pen Inject 15 Units into the skin daily. 6 mL 1   Insulin Pen Needle 32G X 4 MM MISC Use with Lantus 100 each 0   Lancets (FREESTYLE) lancets Use a directed 100 each 0   sacubitril-valsartan (ENTRESTO) 97-103 MG Take 1 tablet by mouth 2 (two) times daily. 60 tablet 11   spironolactone (ALDACTONE) 25 MG tablet Take 1/2 tablet (12.5 mg total) by mouth daily. 30 tablet 11   sulfamethoxazole-trimethoprim (BACTRIM DS) 800-160 MG tablet Take 1 tablet by mouth 2 (two) times daily. 20 tablet 0   rosuvastatin (CRESTOR) 40 MG tablet Take 1 tablet by mouth once daily 30 tablet 0   No current facility-administered medications for this encounter.    Allergies  Allergen Reactions   Metformin And Related Diarrhea      Social History   Socioeconomic History   Marital status: Single    Spouse name: Not on file   Number of children: Not on file   Years of education: Not on file   Highest education level: Not on file  Occupational History   Not on file  Tobacco Use   Smoking status: Never   Smokeless tobacco: Never  Vaping Use   Vaping status: Never Used  Substance and Sexual Activity   Alcohol use: No   Drug use: No  Sexual activity: Not Currently    Partners: Female    Birth control/protection: None  Other Topics Concern   Not on file  Social History Narrative   Not on file   Social Determinants of Health   Financial Resource Strain: Low Risk  (07/12/2022)   Overall Financial Resource Strain (CARDIA)    Difficulty of Paying Living Expenses: Not hard at all  Food Insecurity: No Food Insecurity (07/12/2022)   Hunger Vital Sign    Worried About Running Out of Food in the Last Year: Never true    Ran Out of Food in the Last Year: Never true  Transportation Needs: Unmet Transportation Needs (07/12/2022)   PRAPARE - Transportation    Lack of Transportation (Medical): Yes    Lack of Transportation  (Non-Medical): Yes  Physical Activity: Sufficiently Active (07/12/2022)   Exercise Vital Sign    Days of Exercise per Week: 5 days    Minutes of Exercise per Session: 40 min  Stress: No Stress Concern Present (07/12/2022)   [employer] of Occupational Health - Occupational Stress Questionnaire    Feeling of Stress : Not at all  Social Connections: Socially Isolated (07/12/2022)   Social Connection and Isolation Panel [NHANES]    Frequency of Communication with Friends and Family: More than three times a week    Frequency of Social Gatherings with Friends and Family: Never    Attends Religious Services: Never    Database administrator or Organizations: No    Attends Banker Meetings: Never    Marital Status: Divorced  Catering manager Violence: Not At Risk (07/12/2022)   Humiliation, Afraid, Rape, and Kick questionnaire    Fear of Current or Ex-Partner: No    Emotionally Abused: No    Physically Abused: No    Sexually Abused: No      Family History  Problem Relation Age of Onset   Colon cancer Neg Hx    Esophageal cancer Neg Hx    Stomach cancer Neg Hx    Rectal cancer Neg Hx     PHYSICAL EXAM: Vitals:   01/01/23 1115  BP: 112/78  Pulse: 71  SpO2: 99%   GENERAL: Well nourished, well developed, and in no apparent distress at rest.  HEENT: Negative for arcus senilis or xanthelasma. There is no scleral icterus.  The mucous membranes are pink and moist.   NECK: Supple, No masses. Normal carotid upstrokes without bruits. No masses or thyromegaly.    CHEST: There are no chest wall deformities. There is no chest wall tenderness. Respirations are unlabored.  Lungs- CTA B/L CARDIAC:  JVP: 7 cm H2O         Normal S1, S2  Normal rate with regular rhythm. No murmurs, rubs or gallops.  Pulses are 2+ and symmetrical in upper and lower extremities. No edema.  ABDOMEN: Soft, non-tender, non-distended. There are no masses or hepatomegaly. There are normal bowel sounds.   EXTREMITIES: Warm and well perfused with no cyanosis, clubbing.  LYMPHATIC: No axillary or supraclavicular lymphadenopathy.  NEUROLOGIC: Patient is oriented x3 with no focal or lateralizing neurologic deficits.  PSYCH: Patients affect is appropriate, there is no evidence of anxiety or depression.  SKIN: Warm and dry; no lesions or wounds.   DATA REVIEW  ECG: 01/01/23: NSR  As per my personal interpretation  ECHO: 09/02/21: LVEF 45-50% As per my personal interpretation 04/2019 [facility]): LVEF 20%  CATH: N/A   ASSESSMENT & PLAN:  Heart failure with reduced EF Etiology  of MV:HQIONG nonischemic cardiomyopathy; improvement in LVEF from 20% to 45% with GDMT. Will repeat TTE today. His cardiologist at [facility] mentioned concern for amyloid. He has significant LVH that is likely due to uncontrolled HTN; will obtain CMR to re-assess LVEF  NYHA class / AHA Stage:II Volume status & Diuretics: Euvolemic; PRN only Vasodilators:Entresto 97/103mg  BID, amlodipine 5mg   Beta-Blocker:coreg 25mg  BID EXB:[id number] 25mg  daily Cardiometabolic:jardiance 25mg  daily Devices therapies & Valvulopathies:Not currently indicated; improvement in LVEF last year Advanced therapies:not currently indicated.   2. CKD - sCr 1.3-1.5 at baseline   3. T2DM - A1C of 8.5 in 08/29/22 -continue insulin, jardiance 25mg  daily  4. HTN - amlodipine 5mg  - Entresto 97/103mg  BID - reports being diagnosed with uncontrolled HTN in 1997    Steven Cochran Advanced Heart Failure Mechanical Circulatory Support

## 2023-01-01 NOTE — Patient Instructions (Addendum)
Good to see you today!  No medication changes were made  Labs done today, your results will be available in MyChart, we will contact you for abnormal readings.  Your physician has requested that you have an echocardiogram. Echocardiography is a painless test that uses sound waves to create images of your heart. It provides your doctor with information about the size and shape of your heart and how well your heart's chambers and valves are working. This procedure takes approximately one hour. There are no restrictions for this procedure. Please do NOT wear cologne, perfume, aftershave, or lotions (deodorant is allowed). Please arrive 15 minutes prior to your appointment time.  Your physician has requested that you have a cardiac MRI. Cardiac MRI uses a computer to create images of your heart as its beating, producing both still and moving pictures of your heart and major blood vessels. For further information please visit [email or web address]. Please follow the instruction sheet given to you today for more information.  Your physician recommends that you schedule a follow-up appointment in: 2 months with app Your physician recommends that you schedule a follow-up appointment in: 3 months with Dr. Gasper Lloyd  If you have any questions or concerns before your next appointment please send [place] a message through [facility] or call our office at [phone].    TO LEAVE A MESSAGE FOR THE NURSE SELECT OPTION 2, PLEASE LEAVE A MESSAGE INCLUDING: YOUR NAME DATE OF BIRTH CALL BACK NUMBER REASON FOR CALL**this is important as we prioritize the call backs  YOU WILL RECEIVE A CALL BACK THE SAME DAY AS LONG AS YOU CALL BEFORE 4:00 PM At the [facility], you and your health needs are our priority. As part of our continuing mission to provide you with exceptional heart care, we have created designated Provider Care Teams. These Care Teams include your primary Cardiologist (physician) and Advanced  Practice Providers (APPs- Physician Assistants and Nurse Practitioners) who all work together to provide you with the care you need, when you need it.   You may see any of the following providers on your designated Care Team at your next follow up: Dr Arvilla Meres Dr Marca Ancona Dr. Marcos Eke, NP Robbie Lis, [place] [facility] Arnold Line, [place] Brynda Peon, NP Karle Plumber, PharmD   Please be sure to bring in all your medications bottles to every appointment.    Thank you for choosing [facility]

## 2023-01-04 ENCOUNTER — Other Ambulatory Visit (HOSPITAL_COMMUNITY): Payer: Self-pay

## 2023-01-14 ENCOUNTER — Other Ambulatory Visit: Payer: Self-pay

## 2023-01-14 ENCOUNTER — Ambulatory Visit (INDEPENDENT_AMBULATORY_CARE_PROVIDER_SITE_OTHER): Payer: Medicare HMO | Admitting: Internal Medicine

## 2023-01-14 VITALS — BP 138/85 | HR 72 | Temp 98.2°F | Resp 20 | Ht 74.0 in | Wt 263.0 lb

## 2023-01-14 DIAGNOSIS — Z23 Encounter for immunization: Secondary | ICD-10-CM | POA: Diagnosis not present

## 2023-01-14 DIAGNOSIS — E11319 Type 2 diabetes mellitus with unspecified diabetic retinopathy without macular edema: Secondary | ICD-10-CM | POA: Diagnosis not present

## 2023-01-14 DIAGNOSIS — E13319 Other specified diabetes mellitus with unspecified diabetic retinopathy without macular edema: Secondary | ICD-10-CM

## 2023-01-14 DIAGNOSIS — H401190 Primary open-angle glaucoma, unspecified eye, stage unspecified: Secondary | ICD-10-CM

## 2023-01-14 DIAGNOSIS — E1149 Type 2 diabetes mellitus with other diabetic neurological complication: Secondary | ICD-10-CM | POA: Diagnosis not present

## 2023-01-14 DIAGNOSIS — I110 Hypertensive heart disease with heart failure: Secondary | ICD-10-CM

## 2023-01-14 DIAGNOSIS — I10 Essential (primary) hypertension: Secondary | ICD-10-CM

## 2023-01-14 DIAGNOSIS — I5022 Chronic systolic (congestive) heart failure: Secondary | ICD-10-CM | POA: Diagnosis not present

## 2023-01-14 DIAGNOSIS — D509 Iron deficiency anemia, unspecified: Secondary | ICD-10-CM

## 2023-01-14 DIAGNOSIS — E1169 Type 2 diabetes mellitus with other specified complication: Secondary | ICD-10-CM

## 2023-01-14 DIAGNOSIS — Z299 Encounter for prophylactic measures, unspecified: Secondary | ICD-10-CM

## 2023-01-14 DIAGNOSIS — E785 Hyperlipidemia, unspecified: Secondary | ICD-10-CM | POA: Diagnosis not present

## 2023-01-14 DIAGNOSIS — Z794 Long term (current) use of insulin: Secondary | ICD-10-CM | POA: Diagnosis not present

## 2023-01-14 DIAGNOSIS — Z7984 Long term (current) use of oral hypoglycemic drugs: Secondary | ICD-10-CM

## 2023-01-14 LAB — POCT GLYCOSYLATED HEMOGLOBIN (HGB A1C): Hemoglobin A1C: 8.1 % — AB (ref 4.0–5.6)

## 2023-01-14 LAB — GLUCOSE, CAPILLARY: Glucose-Capillary: 161 mg/dL — ABNORMAL HIGH (ref 70–99)

## 2023-01-14 MED ORDER — INSULIN GLARGINE 100 UNIT/ML SOLOSTAR PEN
15.0000 [IU] | PEN_INJECTOR | Freq: Every day | SUBCUTANEOUS | 1 refills | Status: DC
Start: 2023-01-14 — End: 2023-04-02

## 2023-01-14 MED ORDER — SPIRONOLACTONE 25 MG PO TABS
25.0000 mg | ORAL_TABLET | Freq: Every day | ORAL | 11 refills | Status: DC
Start: 2023-01-14 — End: 2023-01-23

## 2023-01-14 NOTE — Patient Instructions (Signed)
Mr.Ramond Kussman, it was a pleasure seeing you today! You endorsed feeling well today. Below are some of the things we talked about this visit. We look forward to seeing you in the follow up appointment!  Today we discussed: Continue taking all your medications. We will increase your spironolactone to 25 mg. We will restart your ozempic at 0.25 mg weekly.  Follow up with your heart doctor and eye doctor.  Follow up in one month.   I have ordered the following labs today:   Lab Orders         Glucose, capillary         Lipid Profile         Iron, TIBC and Ferritin Panel         Reticulocytes         POC Hbg A1C       Referrals ordered today:   Referral Orders  No referral(s) requested today     I have ordered the following medication/changed the following medications:   Stop the following medications: Medications Discontinued During This Encounter  Medication Reason   sulfamethoxazole-trimethoprim (BACTRIM DS) 800-160 MG tablet Patient has not taken in last 30 days   insulin glargine (LANTUS) 100 UNIT/ML Solostar Pen Reorder   spironolactone (ALDACTONE) 25 MG tablet Reorder     Start the following medications: Meds ordered this encounter  Medications   insulin glargine (LANTUS) 100 UNIT/ML Solostar Pen    Sig: Inject 15 Units into the skin daily.    Dispense:  6 mL    Refill:  1   spironolactone (ALDACTONE) 25 MG tablet    Sig: Take 1 tablet (25 mg total) by mouth daily.    Dispense:  30 tablet    Refill:  11     Follow-up: 1 month follow up   Please make sure to arrive 15 minutes prior to your next appointment. If you arrive late, you may be asked to reschedule.   We look forward to seeing you next time. Please call our clinic at [phone] if you have any questions or concerns. The best time to call is Monday-Friday from 9am-4pm, but there is someone available 24/7. If after hours or the weekend, call the main hospital number and ask for the Internal Medicine  Resident On-Call. If you need medication refills, please notify your pharmacy one week in advance and they will send [place] a request.  Thank you for letting us take part in your care. Wishing you the best!  Thank you, Gwenevere Abbot, MD

## 2023-01-14 NOTE — Progress Notes (Unsigned)
CC: Follow up visit  HPI:  Mr.[name] is a 52 y.o. male with a past medical history of HTN, HLD, DMII c.b neuropathy and nephropathy with CKDIIIa, hx of stroke, anemia  presenting to [facility] for a follow up. Please see problem-based list for further details, assessments, and plans.  Past Medical History:  Diagnosis Date   Cataract    CHF (congestive heart failure) (HCC)    Diabetes mellitus    Hypertension    Stroke [facility])     Current Outpatient Medications (Endocrine & Metabolic):    empagliflozin (JARDIANCE) 25 MG TABS tablet, Take 1 tablet (25 mg total) by mouth daily.   insulin glargine (LANTUS) 100 UNIT/ML Solostar Pen, Inject 15 Units into the skin daily.  Current Outpatient Medications (Cardiovascular):    ezetimibe (ZETIA) 10 MG tablet, Take 1 tablet (10 mg total) by mouth daily.   amLODipine (NORVASC) 5 MG tablet, Take 1 tablet (5 mg total) by mouth daily.   carvedilol (COREG) 25 MG tablet, Take 1 tablet (25 mg total) by mouth 2 (two) times daily.   rosuvastatin (CRESTOR) 40 MG tablet, Take 1 tablet by mouth once daily   sacubitril-valsartan (ENTRESTO) 97-103 MG, Take 1 tablet by mouth 2 (two) times daily.   spironolactone (ALDACTONE) 25 MG tablet, Take 1 tablet (25 mg total) by mouth daily.   Current Outpatient Medications (Analgesics):    aspirin EC 81 MG EC tablet, Take 1 tablet (81 mg total) by mouth daily. Swallow whole.   Current Outpatient Medications (Other):    Blood Glucose Monitoring Suppl (FREESTYLE LITE) w/Device KIT, Use as directed   dorzolamide-timolol (COSOPT) 2-0.5 % ophthalmic solution, Place 1 drop into both eyes 2 (two) times daily.   glucose blood (FREESTYLE LITE) test strip, Monitor blood sugar at home   Insulin Pen Needle 32G X 4 MM MISC, Use with Lantus   Lancets (FREESTYLE) lancets, Use a directed  Review of Systems:  Review of system negative unless stated in the problem list or HPI.    Physical Exam:  Vitals:   01/14/23 1544   BP: 138/85  Pulse: 72  Resp: 20  Temp: 98.2 F (36.8 C)  TempSrc: Oral  SpO2: 100%  Weight: 263 lb (119.3 kg)  Height: 6\' 2"  (1.88 m)   Physical Exam General: NAD HENT: NCAT Lungs: CTAB, no wheeze, rhonchi or rales.  Cardiovascular: Normal heart sounds, no r/m/g, 2+ pulses in all extremities. No LE edema Abdomen: No TTP, normal bowel sounds MSK: No asymmetry or muscle atrophy.  Skin: no lesions noted on exposed skin Neuro: Alert and oriented x4. CN grossly intact Psych: Normal mood and normal affect  Assessment & Plan:   Type II diabetes mellitus with neurological manifestations (HCC) Pt with DMII that is uncontrolled. Current regimen is Lantus 15 units, Jardiance 25 mg every day. His A1c 8.5 5 months ago. A1c this visit 8.1. Will restart him on Ozempic 0.25 mg weekly. Pt states at higher doses, it led him to have dry mouth. Will start at this dose and keep at low dose for now as it provides him some benefit.   HTN (hypertension) Pt's blood pressure more improved from before. Will continue current regimen of Coreg 25 mg BID, amlodipine 5 mg every day, Entresto 97-103 mg BID, and increase the spironolactone to 25 mg every day. Pt was previously on spironolactone 12.5 mg daily but was taking whole tablet every other day as he did not have a pill cutter. Advised to take whole tablet  every day. Will follow up in one month with repeat BMP and further titration of spironolactone to 50 mg for further benefits. BMP in the last 2 weeks shows normal renal function and K at 4.1.   Chronic systolic CHF (congestive heart failure) (HCC) Recently pt was seen by cardiologist. Plan is to repeat echo and CMR given LVH. Continue GDMT and increase spironolactone to 25 mg every day for further benefit and better BP control.   Retinopathy due to secondary diabetes mellitus (HCC) Receives IV avastin last performed 10/2022.Was to return in 8 weeks. Next visit on 11th.   Primary open angle glaucoma On  cosopt eye drop. Advised to continue his follow up and continue his eye drops.   Preventive measure Flu shot given this visit  Hyperlipidemia associated with type 2 diabetes mellitus (HCC) Pt on Crestor 40 mg since initiation of statin. He is adherent to his regimen. Lipid panel checked this visit shows LDL 83. Goal is LDL <70 so will add zetia to his regimen. Goal is secondary prevention given hx of event.   Microcytic anemia Pt's gb 9.1 6 weeks ago. MCV 70. Iron studies in 09/2022 show iron 29, TIBC 165, Tsat 18 %. Ferritin 555. Repeat iron panel show iron 63 and Tsat 28 %. Retic count checked and reticulocyte index calculated using last CBC is 0.53 suggestive of hyperproliferation. No need for iron replacement at this time. Will check CBC with next blood work and suspect hgb improvement given improvement in his iron stores.    See Encounters Tab for problem based charting.  Patient Discussed with Dr. Karrie Meres, MD Eligha Bridegroom. [facility] Internal Medicine Residency, PGY-3

## 2023-01-15 DIAGNOSIS — H401190 Primary open-angle glaucoma, unspecified eye, stage unspecified: Secondary | ICD-10-CM | POA: Insufficient documentation

## 2023-01-15 LAB — IRON,TIBC AND FERRITIN PANEL
Ferritin: 630 ng/mL — ABNORMAL HIGH (ref 30–400)
Iron Saturation: 28 % (ref 15–55)
Iron: 63 ug/dL (ref 38–169)
Total Iron Binding Capacity: 225 ug/dL — ABNORMAL LOW (ref 250–450)
UIBC: 162 ug/dL (ref 111–343)

## 2023-01-15 LAB — LIPID PANEL
Chol/HDL Ratio: 3.9 ratio (ref 0.0–5.0)
Cholesterol, Total: 144 mg/dL (ref 100–199)
HDL: 37 mg/dL — ABNORMAL LOW (ref >39)
LDL Chol Calc (NIH): 83 mg/dL (ref 0–99)
Triglycerides: 138 mg/dL (ref 0–149)
VLDL Cholesterol Cal: 24 mg/dL (ref 5–40)

## 2023-01-15 LAB — RETICULOCYTES: Retic Ct Pct: 1.1 % (ref 0.6–2.6)

## 2023-01-15 MED ORDER — EZETIMIBE 10 MG PO TABS: 10.0000 mg | ORAL_TABLET | Freq: Every day | ORAL | 11 refills | Status: DC

## 2023-01-15 NOTE — Assessment & Plan Note (Signed)
Pt with DMII that is uncontrolled. Current regimen is Lantus 15 units, Jardiance 25 mg every day. His A1c 8.5 5 months ago. A1c this visit 8.1. Will restart him on Ozempic 0.25 mg weekly. Pt states at higher doses, it led him to have dry mouth. Will start at this dose and keep at low dose for now as it provides him some benefit.

## 2023-01-15 NOTE — Assessment & Plan Note (Signed)
On cosopt eye drop. Advised to continue his follow up and continue his eye drops.

## 2023-01-15 NOTE — Assessment & Plan Note (Addendum)
Recently pt was seen by cardiologist. Plan is to repeat echo and CMR given LVH. Continue GDMT and increase spironolactone to 25 mg every day for further benefit and better BP control.

## 2023-01-15 NOTE — Assessment & Plan Note (Signed)
Flu shot given this visit. 

## 2023-01-15 NOTE — Assessment & Plan Note (Addendum)
Pt's blood pressure more improved from before. Will continue current regimen of Coreg 25 mg BID, amlodipine 5 mg every day, Entresto 97-103 mg BID, and increase the spironolactone to 25 mg every day. Pt was previously on spironolactone 12.5 mg daily but was taking whole tablet every other day as he did not have a pill cutter. Advised to take whole tablet every day. Will follow up in one month with repeat BMP and further titration of spironolactone to 50 mg for further benefits. BMP in the last 2 weeks shows normal renal function and K at 4.1.

## 2023-01-15 NOTE — Assessment & Plan Note (Signed)
Pt on Crestor 40 mg since initiation of statin. He is adherent to his regimen. Lipid panel checked this visit shows LDL 83. Goal is LDL <70 so will add zetia to his regimen. Goal is secondary prevention given hx of event.

## 2023-01-15 NOTE — Assessment & Plan Note (Signed)
Receives IV avastin last performed 10/2022.Was to return in 8 weeks. Next visit on 11th.

## 2023-01-15 NOTE — Assessment & Plan Note (Signed)
Pt's gb 9.1 6 weeks ago. MCV 70. Iron studies in 09/2022 show iron 29, TIBC 165, Tsat 18 %. Ferritin 555. Repeat iron panel show iron 63 and Tsat 28 %. Retic count checked and reticulocyte index calculated using last CBC is 0.53 suggestive of hyperproliferation. No need for iron replacement at this time. Will check CBC with next blood work and suspect hgb improvement given improvement in his iron stores.

## 2023-01-16 DIAGNOSIS — E113511 Type 2 diabetes mellitus with proliferative diabetic retinopathy with macular edema, right eye: Secondary | ICD-10-CM | POA: Diagnosis not present

## 2023-01-18 NOTE — Progress Notes (Signed)
Internal Medicine Clinic Attending  Case discussed with the resident at the time of the visit.  We reviewed the resident's history and exam and pertinent patient test results.  I agree with the assessment, diagnosis, and plan of care documented in the resident's note.

## 2023-01-21 ENCOUNTER — Encounter (HOSPITAL_COMMUNITY): Payer: Self-pay

## 2023-01-21 ENCOUNTER — Emergency Department (HOSPITAL_COMMUNITY): Payer: Medicare HMO

## 2023-01-21 ENCOUNTER — Inpatient Hospital Stay (HOSPITAL_COMMUNITY)
Admission: EM | Admit: 2023-01-21 | Discharge: 2023-01-23 | DRG: 638 | Disposition: A | Discharge status: Home Health | Payer: Medicare HMO | Attending: Internal Medicine | Admitting: Internal Medicine

## 2023-01-21 ENCOUNTER — Inpatient Hospital Stay (HOSPITAL_COMMUNITY): Payer: Medicare HMO

## 2023-01-21 ENCOUNTER — Other Ambulatory Visit: Payer: Self-pay

## 2023-01-21 DIAGNOSIS — Z8673 Personal history of transient ischemic attack (TIA), and cerebral infarction without residual deficits: Secondary | ICD-10-CM | POA: Diagnosis not present

## 2023-01-21 DIAGNOSIS — Z794 Long term (current) use of insulin: Secondary | ICD-10-CM | POA: Diagnosis not present

## 2023-01-21 DIAGNOSIS — D631 Anemia in chronic kidney disease: Secondary | ICD-10-CM | POA: Diagnosis present

## 2023-01-21 DIAGNOSIS — E669 Obesity, unspecified: Secondary | ICD-10-CM | POA: Diagnosis present

## 2023-01-21 DIAGNOSIS — E11621 Type 2 diabetes mellitus with foot ulcer: Secondary | ICD-10-CM | POA: Diagnosis not present

## 2023-01-21 DIAGNOSIS — N182 Chronic kidney disease, stage 2 (mild): Secondary | ICD-10-CM | POA: Diagnosis not present

## 2023-01-21 DIAGNOSIS — D638 Anemia in other chronic diseases classified elsewhere: Secondary | ICD-10-CM | POA: Diagnosis present

## 2023-01-21 DIAGNOSIS — I10 Essential (primary) hypertension: Secondary | ICD-10-CM

## 2023-01-21 DIAGNOSIS — M65872 Other synovitis and tenosynovitis, left ankle and foot: Secondary | ICD-10-CM | POA: Diagnosis not present

## 2023-01-21 DIAGNOSIS — L089 Local infection of the skin and subcutaneous tissue, unspecified: Secondary | ICD-10-CM

## 2023-01-21 DIAGNOSIS — D509 Iron deficiency anemia, unspecified: Secondary | ICD-10-CM | POA: Diagnosis present

## 2023-01-21 DIAGNOSIS — E1169 Type 2 diabetes mellitus with other specified complication: Secondary | ICD-10-CM | POA: Diagnosis not present

## 2023-01-21 DIAGNOSIS — Z7982 Long term (current) use of aspirin: Secondary | ICD-10-CM | POA: Diagnosis not present

## 2023-01-21 DIAGNOSIS — N179 Acute kidney failure, unspecified: Secondary | ICD-10-CM | POA: Diagnosis not present

## 2023-01-21 DIAGNOSIS — E785 Hyperlipidemia, unspecified: Secondary | ICD-10-CM | POA: Diagnosis present

## 2023-01-21 DIAGNOSIS — M869 Osteomyelitis, unspecified: Secondary | ICD-10-CM | POA: Diagnosis not present

## 2023-01-21 DIAGNOSIS — M65172 Other infective (teno)synovitis, left ankle and foot: Secondary | ICD-10-CM | POA: Diagnosis present

## 2023-01-21 DIAGNOSIS — Z79899 Other long term (current) drug therapy: Secondary | ICD-10-CM | POA: Diagnosis not present

## 2023-01-21 DIAGNOSIS — D649 Anemia, unspecified: Secondary | ICD-10-CM | POA: Diagnosis not present

## 2023-01-21 DIAGNOSIS — Z89422 Acquired absence of other left toe(s): Secondary | ICD-10-CM

## 2023-01-21 DIAGNOSIS — Z7984 Long term (current) use of oral hypoglycemic drugs: Secondary | ICD-10-CM | POA: Diagnosis not present

## 2023-01-21 DIAGNOSIS — E1149 Type 2 diabetes mellitus with other diabetic neurological complication: Secondary | ICD-10-CM

## 2023-01-21 DIAGNOSIS — N1831 Chronic kidney disease, stage 3a: Secondary | ICD-10-CM | POA: Diagnosis present

## 2023-01-21 DIAGNOSIS — E1122 Type 2 diabetes mellitus with diabetic chronic kidney disease: Secondary | ICD-10-CM | POA: Diagnosis not present

## 2023-01-21 DIAGNOSIS — M86672 Other chronic osteomyelitis, left ankle and foot: Secondary | ICD-10-CM | POA: Diagnosis not present

## 2023-01-21 DIAGNOSIS — L97521 Non-pressure chronic ulcer of other part of left foot limited to breakdown of skin: Secondary | ICD-10-CM | POA: Diagnosis present

## 2023-01-21 DIAGNOSIS — M86172 Other acute osteomyelitis, left ankle and foot: Secondary | ICD-10-CM | POA: Diagnosis present

## 2023-01-21 DIAGNOSIS — Z6834 Body mass index (BMI) 34.0-34.9, adult: Secondary | ICD-10-CM | POA: Diagnosis not present

## 2023-01-21 DIAGNOSIS — R600 Localized edema: Secondary | ICD-10-CM | POA: Diagnosis not present

## 2023-01-21 DIAGNOSIS — Z888 Allergy status to other drugs, medicaments and biological substances status: Secondary | ICD-10-CM | POA: Diagnosis not present

## 2023-01-21 DIAGNOSIS — I5022 Chronic systolic (congestive) heart failure: Secondary | ICD-10-CM | POA: Diagnosis not present

## 2023-01-21 DIAGNOSIS — I130 Hypertensive heart and chronic kidney disease with heart failure and stage 1 through stage 4 chronic kidney disease, or unspecified chronic kidney disease: Secondary | ICD-10-CM | POA: Diagnosis present

## 2023-01-21 DIAGNOSIS — Z0389 Encounter for observation for other suspected diseases and conditions ruled out: Secondary | ICD-10-CM | POA: Diagnosis not present

## 2023-01-21 DIAGNOSIS — S91302A Unspecified open wound, left foot, initial encounter: Secondary | ICD-10-CM | POA: Diagnosis not present

## 2023-01-21 DIAGNOSIS — L03116 Cellulitis of left lower limb: Principal | ICD-10-CM | POA: Diagnosis present

## 2023-01-21 LAB — PREALBUMIN: Prealbumin: 10 mg/dL — ABNORMAL LOW (ref 18–38)

## 2023-01-21 LAB — CBC WITH DIFFERENTIAL/PLATELET
Abs Immature Granulocytes: 0.03 10*3/uL (ref 0.00–0.07)
Basophils Absolute: 0 10*3/uL (ref 0.0–0.1)
Basophils Relative: 0 %
Eosinophils Absolute: 0 10*3/uL (ref 0.0–0.5)
Eosinophils Relative: 0 %
HCT: 29.1 % — ABNORMAL LOW (ref 39.0–52.0)
Hemoglobin: 9 g/dL — ABNORMAL LOW (ref 13.0–17.0)
Immature Granulocytes: 0 %
Lymphocytes Relative: 17 %
Lymphs Abs: 1.6 10*3/uL (ref 0.7–4.0)
MCH: 22.7 pg — ABNORMAL LOW (ref 26.0–34.0)
MCHC: 30.9 g/dL (ref 30.0–36.0)
MCV: 73.3 fL — ABNORMAL LOW (ref 80.0–100.0)
Monocytes Absolute: 1.1 10*3/uL — ABNORMAL HIGH (ref 0.1–1.0)
Monocytes Relative: 11 %
Neutro Abs: 6.9 10*3/uL (ref 1.7–7.7)
Neutrophils Relative %: 72 %
Platelets: 239 10*3/uL (ref 150–400)
RBC: 3.97 MIL/uL — ABNORMAL LOW (ref 4.22–5.81)
RDW: 15.5 % (ref 11.5–15.5)
WBC: 9.7 10*3/uL (ref 4.0–10.5)
nRBC: 0 % (ref 0.0–0.2)

## 2023-01-21 LAB — SEDIMENTATION RATE: Sed Rate: 68 mm/h — ABNORMAL HIGH (ref 0–16)

## 2023-01-21 LAB — COMPREHENSIVE METABOLIC PANEL
ALT: 26 U/L (ref 0–44)
AST: 23 U/L (ref 15–41)
Albumin: 3.9 g/dL (ref 3.5–5.0)
Alkaline Phosphatase: 83 U/L (ref 38–126)
Anion gap: 11 (ref 5–15)
BUN: 39 mg/dL — ABNORMAL HIGH (ref 6–20)
CO2: 21 mmol/L — ABNORMAL LOW (ref 22–32)
Calcium: 8.9 mg/dL (ref 8.9–10.3)
Chloride: 102 mmol/L (ref 98–111)
Creatinine, Ser: 2.36 mg/dL — ABNORMAL HIGH (ref 0.61–1.24)
GFR, Estimated: 33 mL/min — ABNORMAL LOW (ref >60)
Glucose, Bld: 224 mg/dL — ABNORMAL HIGH (ref 70–99)
Potassium: 4.4 mmol/L (ref 3.5–5.1)
Sodium: 134 mmol/L — ABNORMAL LOW (ref 135–145)
Total Bilirubin: 0.7 mg/dL (ref 0.3–1.2)
Total Protein: 7.7 g/dL (ref 6.5–8.1)

## 2023-01-21 LAB — C-REACTIVE PROTEIN: CRP: 11.7 mg/dL — ABNORMAL HIGH (ref <1.0)

## 2023-01-21 LAB — GLUCOSE, CAPILLARY
Glucose-Capillary: 167 mg/dL — ABNORMAL HIGH (ref 70–99)
Glucose-Capillary: 128 mg/dL — ABNORMAL HIGH (ref 70–99)

## 2023-01-21 LAB — I-STAT CG4 LACTIC ACID, ED: Lactic Acid, Venous: 0.5 mmol/L (ref 0.5–1.9)

## 2023-01-21 MED ORDER — SODIUM CHLORIDE 0.9 % IV SOLN
2.0000 g | INTRAVENOUS | Status: DC
  Filled 2023-01-21: qty 20

## 2023-01-21 MED ORDER — CARVEDILOL 25 MG PO TABS
25.0000 mg | ORAL_TABLET | Freq: Two times a day (BID) | ORAL | Status: DC
  Administered 2023-01-21 – 2023-01-23 (×4): 25 mg via ORAL
  Filled 2023-01-21 (×2): qty 2
  Filled 2023-01-21: qty 1
  Filled 2023-01-21: qty 2

## 2023-01-21 MED ORDER — ACETAMINOPHEN 325 MG PO TABS
650.0000 mg | ORAL_TABLET | Freq: Four times a day (QID) | ORAL | Status: DC | PRN
  Administered 2023-01-22: 650 mg via ORAL
  Filled 2023-01-21: qty 2

## 2023-01-21 MED ORDER — DORZOLAMIDE HCL-TIMOLOL MAL 2-0.5 % OP SOLN
1.0000 [drp] | Freq: Two times a day (BID) | OPHTHALMIC | Status: DC
  Administered 2023-01-22 – 2023-01-23 (×2): 1 [drp] via OPHTHALMIC
  Filled 2023-01-21 (×2): qty 10

## 2023-01-21 MED ORDER — SENNOSIDES-DOCUSATE SODIUM 8.6-50 MG PO TABS: 1.0000 | ORAL_TABLET | Freq: Every evening | ORAL | Status: DC | PRN

## 2023-01-21 MED ORDER — HEPARIN SODIUM (PORCINE) 5000 UNIT/ML IJ SOLN
5000.0000 [IU] | Freq: Three times a day (TID) | INTRAMUSCULAR | Status: DC
  Administered 2023-01-21 – 2023-01-23 (×5): 5000 [IU] via SUBCUTANEOUS
  Filled 2023-01-21 (×5): qty 1

## 2023-01-21 MED ORDER — METRONIDAZOLE 500 MG PO TABS
500.0000 mg | ORAL_TABLET | Freq: Two times a day (BID) | ORAL | Status: DC
  Filled 2023-01-21: qty 1

## 2023-01-21 MED ORDER — LINEZOLID 600 MG/300ML IV SOLN
600.0000 mg | Freq: Two times a day (BID) | INTRAVENOUS | Status: DC
  Administered 2023-01-21 – 2023-01-22 (×2): 600 mg via INTRAVENOUS
  Filled 2023-01-21 (×4): qty 300

## 2023-01-21 MED ORDER — ROSUVASTATIN CALCIUM 20 MG PO TABS
40.0000 mg | ORAL_TABLET | Freq: Every day | ORAL | Status: DC
  Administered 2023-01-22: 40 mg via ORAL
  Filled 2023-01-21 (×2): qty 2
  Filled 2023-01-21: qty 4

## 2023-01-21 MED ORDER — AMLODIPINE BESYLATE 5 MG PO TABS
5.0000 mg | ORAL_TABLET | Freq: Every day | ORAL | Status: DC
  Administered 2023-01-22 – 2023-01-23 (×2): 5 mg via ORAL
  Filled 2023-01-21 (×2): qty 1

## 2023-01-21 MED ORDER — GADOBUTROL 1 MMOL/ML IV SOLN
10.0000 mL | Freq: Once | INTRAVENOUS | Status: AC | PRN
  Administered 2023-01-21: 10 mL via INTRAVENOUS

## 2023-01-21 MED ORDER — OXYCODONE HCL 5 MG PO TABS: 5.0000 mg | ORAL_TABLET | ORAL | Status: DC | PRN

## 2023-01-21 MED ORDER — SODIUM CHLORIDE 0.9 % IV BOLUS
1000.0000 mL | Freq: Once | INTRAVENOUS | Status: AC
  Administered 2023-01-21: 1000 mL via INTRAVENOUS

## 2023-01-21 MED ORDER — SODIUM CHLORIDE 0.9 % IV SOLN
2.0000 g | INTRAVENOUS | Status: DC
  Administered 2023-01-21 – 2023-01-22 (×2): 2 g via INTRAVENOUS
  Filled 2023-01-21: qty 20

## 2023-01-21 MED ORDER — INSULIN ASPART 100 UNIT/ML IJ SOLN
0.0000 [IU] | Freq: Three times a day (TID) | INTRAMUSCULAR | Status: DC
  Administered 2023-01-21 – 2023-01-23 (×4): 1 [IU] via SUBCUTANEOUS
  Filled 2023-01-21: qty 0.06

## 2023-01-21 MED ORDER — ACETAMINOPHEN 650 MG RE SUPP: 650.0000 mg | Freq: Four times a day (QID) | RECTAL | Status: DC | PRN

## 2023-01-21 MED ORDER — METRONIDAZOLE 500 MG/100ML IV SOLN
500.0000 mg | Freq: Two times a day (BID) | INTRAVENOUS | Status: DC
  Administered 2023-01-21 – 2023-01-23 (×4): 500 mg via INTRAVENOUS
  Filled 2023-01-21 (×4): qty 100

## 2023-01-21 MED ORDER — ASPIRIN 81 MG PO TBEC
81.0000 mg | DELAYED_RELEASE_TABLET | Freq: Every day | ORAL | Status: DC
  Administered 2023-01-22 – 2023-01-23 (×2): 81 mg via ORAL
  Filled 2023-01-21 (×2): qty 1

## 2023-01-21 MED ORDER — INSULIN ASPART 100 UNIT/ML IJ SOLN
0.0000 [IU] | Freq: Every day | INTRAMUSCULAR | Status: DC
  Filled 2023-01-21: qty 0.05

## 2023-01-21 NOTE — ED Triage Notes (Signed)
Pt complaining of swelling in the left foot that has been present for apprx 2 days. Denies any redness or pain. Concerned there could be an infection.

## 2023-01-21 NOTE — H&P (Signed)
History and Physical    Janathan Cochran HYQ:[id number] DOB: December 26, 1970 DOA: 01/21/2023  PCP: Gwenevere Abbot, MD   Patient coming from: Home   Chief Complaint: Left foot swelling   HPI: Steven Cochran is a 52 y.o. male with medical history significant for hypertension, insulin-dependent diabetes mellitus, chronic HFmrEF, and history of CVA who presents with left foot swelling.  Patient underwent left foot fifth ray amputation in May 2024 for osteomyelitis.  ABIs were within normal limits bilaterally at that time.  Patient reports that the surgical wound from May has healed well and he was in his usual state until a couple days ago when he developed left foot swelling and skin breakdown over the dorsal aspect of his ankle.  He denies any fevers, chills, chest pain, or new focal numbness or weakness.  ED Course: Upon arrival to the ED, patient is found to be afebrile and saturating well on room air with normal heart rate and stable blood pressure.  Labs are most notable for creatinine 2.36 and hemoglobin 9.0.  No evidence for osteomyelitis is noted on plain radiographs of the left foot.  Blood cultures were collected in the ED and the patient was treated with Rocephin, Flagyl, and 1 L of normal saline.  Review of Systems:  All other systems reviewed and apart from HPI, are negative.  Past Medical History:  Diagnosis Date   Cataract    CHF (congestive heart failure) (HCC)    Diabetes mellitus    Hypertension    Stroke [facility])     Past Surgical History:  Procedure Laterality Date   AMPUTATION Left 09/26/2022   Procedure: LEFT FOOT 5TH RAY AMPUTATION;  Surgeon: Nadara Mustard, MD;  Location: [facility] OR;  Service: Orthopedics;  Laterality: Left;   CATARACT EXTRACTION Bilateral    SKIN GRAFT     feet    Social History:   reports that he has never smoked. He has never used smokeless tobacco. He reports that he does not drink alcohol and does not use drugs.  Allergies  Allergen Reactions    Metformin And Related Diarrhea    Family History  Problem Relation Age of Onset   Colon cancer Neg Hx    Esophageal cancer Neg Hx    Stomach cancer Neg Hx    Rectal cancer Neg Hx      Prior to Admission medications   Medication Sig Start Date End Date Taking? Authorizing Provider  amLODipine (NORVASC) 5 MG tablet Take 1 tablet (5 mg total) by mouth daily. 12/30/22   Gwenevere Abbot, MD  aspirin EC 81 MG EC tablet Take 1 tablet (81 mg total) by mouth daily. Swallow whole. 09/04/21   Leroy Sea, MD  Blood Glucose Monitoring Suppl (FREESTYLE LITE) w/Device KIT Use as directed 06/15/22   Lanae Boast, MD  carvedilol (COREG) 25 MG tablet Take 1 tablet (25 mg total) by mouth 2 (two) times daily. 12/30/22 04/29/23  Gwenevere Abbot, MD  dorzolamide-timolol (COSOPT) 2-0.5 % ophthalmic solution Place 1 drop into both eyes 2 (two) times daily. 11/12/22   [provider]  empagliflozin (JARDIANCE) 25 MG TABS tablet Take 1 tablet (25 mg total) by mouth daily. 12/30/22   Gwenevere Abbot, MD  ezetimibe (ZETIA) 10 MG tablet Take 1 tablet (10 mg total) by mouth daily. 01/15/23 01/15/24  Gwenevere Abbot, MD  glucose blood (FREESTYLE LITE) test strip Monitor blood sugar at home 06/15/22   Lanae Boast, MD  insulin glargine (LANTUS) 100 UNIT/ML Solostar Pen Inject 15  Units into the skin daily. 01/14/23   Gwenevere Abbot, MD  Insulin Pen Needle 32G X 4 MM MISC Use with Lantus 09/28/22   Lyndle Herrlich, MD  Lancets (FREESTYLE) lancets Use a directed 06/15/22   Lanae Boast, MD  rosuvastatin (CRESTOR) 40 MG tablet Take 1 tablet by mouth once daily 01/03/23   Gwenevere Abbot, MD  sacubitril-valsartan (ENTRESTO) 97-103 MG Take 1 tablet by mouth 2 (two) times daily. 12/30/22 12/30/23  Gwenevere Abbot, MD  spironolactone (ALDACTONE) 25 MG tablet Take 1 tablet (25 mg total) by mouth daily. 01/14/23 01/14/24  Gwenevere Abbot, MD    Physical Exam: Vitals:   01/21/23 1131 01/21/23 1235  BP:  (!) 145/81  Pulse:  80  Resp:  16  Temp:  98.7 F  (37.1 C)  TempSrc:  Oral  SpO2:  97%  Weight: 119.3 kg   Height: 6\' 2"  (1.88 m)      Constitutional: NAD, calm  Eyes: PERTLA, lids and conjunctivae normal ENMT: Mucous membranes are moist. Posterior pharynx clear of any exudate or lesions.   Neck: supple, no masses  Respiratory: no wheezing, no crackles. No accessory muscle use.  Cardiovascular: S1 & S2 heard, regular rate and rhythm. No JVD. Abdomen: No distension, no tenderness, soft. Bowel sounds active.  Musculoskeletal: no clubbing / cyanosis. No joint deformity upper and lower extremities.   Skin: eschar over dorsal left foot with scant drainage about the margins. Warm, dry, well-perfused. Neurologic: CN 2-12 grossly intact. Moving all extremities. Alert and oriented.  Psychiatric: Argumentative. Poorly cooperative.     Labs and Imaging on Admission: I have personally reviewed following labs and imaging studies  CBC: Recent Labs  Lab 01/21/23 1140  WBC 9.7  NEUTROABS 6.9  HGB 9.0*  HCT 29.1*  MCV 73.3*  PLT 239   Basic Metabolic Panel: Recent Labs  Lab 01/21/23 1140  NA 134*  K 4.4  CL 102  CO2 21*  GLUCOSE 224*  BUN 39*  CREATININE 2.36*  CALCIUM 8.9   GFR: Estimated Creatinine Clearance: 50.8 mL/min (A) (by C-G formula based on SCr of 2.36 mg/dL (H)). Liver Function Tests: Recent Labs  Lab 01/21/23 1140  AST 23  ALT 26  ALKPHOS 83  BILITOT 0.7  PROT 7.7  ALBUMIN 3.9   No results for input(s): "LIPASE", "AMYLASE" in the last 168 hours. No results for input(s): "AMMONIA" in the last 168 hours. Coagulation Profile: No results for input(s): "INR", "PROTIME" in the last 168 hours. Cardiac Enzymes: No results for input(s): "CKTOTAL", "CKMB", "CKMBINDEX", "TROPONINI" in the last 168 hours. BNP (last 3 results) No results for input(s): "PROBNP" in the last 8760 hours. HbA1C: No results for input(s): "HGBA1C" in the last 72 hours. CBG: Recent Labs  Lab 01/14/23 1601  GLUCAP 161*   Lipid  Profile: No results for input(s): "CHOL", "HDL", "LDLCALC", "TRIG", "CHOLHDL", "LDLDIRECT" in the last 72 hours. Thyroid Function Tests: No results for input(s): "TSH", "T4TOTAL", "FREET4", "T3FREE", "THYROIDAB" in the last 72 hours. Anemia Panel: No results for input(s): "VITAMINB12", "FOLATE", "FERRITIN", "TIBC", "IRON", "RETICCTPCT" in the last 72 hours. Urine analysis:    Component Value Date/Time   COLORURINE YELLOW 10/15/2018 2002   APPEARANCEUR CLOUDY (A) 10/15/2018 2002   LABSPEC 1.022 10/15/2018 2002   PHURINE 5.0 10/15/2018 2002   GLUCOSEU >=500 (A) 10/15/2018 2002   HGBUR SMALL (A) 10/15/2018 2002   BILIRUBINUR NEGATIVE 10/15/2018 2002   KETONESUR 20 (A) 10/15/2018 2002   PROTEINUR >=300 (A) 10/15/2018 2002   UROBILINOGEN  0.2 06/01/2013 0306   NITRITE NEGATIVE 10/15/2018 2002   LEUKOCYTESUR NEGATIVE 10/15/2018 2002   Sepsis Labs: @LABRCNTIP (procalcitonin:4,lacticidven:4) )No results found for this or any previous visit (from the past 240 hour(s)).   Radiological Exams on Admission: DG Foot Complete Left  Result Date: 01/21/2023 CLINICAL DATA:  Concern for osteomyelitis EXAM: LEFT FOOT - COMPLETE 3+ VIEW COMPARISON:  Ankle film 12/06/2022 FINDINGS: Osteotomy site in the proximal fifth metatarsal. Staple within the soft tissue of the great toe along the lateral border at the level of the interphalangeal joint. staple present on comparison radiograph. No osseous erosion to suggest acute osteomyelitis. IMPRESSION: 1. Foreign body (staple) within the soft tissues of the great toe. 2. No evidence of acute osteomyelitis. Electronically Signed   By: Genevive Bi M.D.   On: 01/21/2023 14:56     Assessment/Plan   1. Left foot infection  - Not septic on admission   - No acute osteo on plain radiographs  - ABIs were WNL b/l in May 2024  - Check MRI, continue empiric antibiotics    2. AKI superimposed on CKD 2  - SCr is 2.36 on admission, up from 1.38 in June 2024  - Given  1 liter NS in ED  - Check urinalysis and renal US, hold Aldactone and Jardiance, renally-dose medications, repeat chem panel in am   3. Chronic HFmrEF  - Appears compensated   - Given 1 liter NS in ED  - Hold Aldactone and Jardiance for now in light of AKI, continue Coreg, monitor weight and I/Os    4. Insulin-dependent DM  - A1c was 8.1% in September 2024  - Check CBGs and use low-intensity SSI only for now   5. HTN  - Continue Coreg, Norvasc    6. Hx of CVA  - Continue ASA and Crestor   7. Anemia  - Appears stable, no over bleeding     DVT prophylaxis: sq heparin  Code Status: Full  Level of Care: Level of care: Med-Surg Family Communication: none present  Disposition Plan:  Patient is from: home  Anticipated d/c is to: TBD Anticipated d/c date is: 01/24/23  Patient currently: Pending MRI left foot, improved/stable renal function  Consults called: None  Admission status: Inpatient     Briscoe Deutscher, MD [facility]  01/21/2023, 3:22 PM

## 2023-01-21 NOTE — ED Provider Notes (Signed)
[place] EMERGENCY DEPARTMENT AT [facility] Provider Note   CSN: [id number] Arrival date & time: 01/21/23  1108     History  Chief Complaint  Patient presents with   Leg Swelling    Steven Cochran is a 52 y.o. male with history of CHF, diabetes, hypertension, stroke, prior left fifth digit amputation, who presents the emergency department complaining of wound on the left foot.  Patient has been addressing this wound himself, and states over the past 2 to 3 days he has had more swelling around the wound.  It is not very painful, but he was concerned there could be an infection.  No fever.  HPI     Home Medications Prior to Admission medications   Medication Sig Start Date End Date Taking? Authorizing Provider  amLODipine (NORVASC) 5 MG tablet Take 1 tablet (5 mg total) by mouth daily. 12/30/22   Gwenevere Abbot, MD  aspirin EC 81 MG EC tablet Take 1 tablet (81 mg total) by mouth daily. Swallow whole. 09/04/21   Leroy Sea, MD  Blood Glucose Monitoring Suppl (FREESTYLE LITE) w/Device KIT Use as directed 06/15/22   Lanae Boast, MD  carvedilol (COREG) 25 MG tablet Take 1 tablet (25 mg total) by mouth 2 (two) times daily. 12/30/22 04/29/23  Gwenevere Abbot, MD  dorzolamide-timolol (COSOPT) 2-0.5 % ophthalmic solution Place 1 drop into both eyes 2 (two) times daily. 11/12/22   [provider]  empagliflozin (JARDIANCE) 25 MG TABS tablet Take 1 tablet (25 mg total) by mouth daily. 12/30/22   Gwenevere Abbot, MD  ezetimibe (ZETIA) 10 MG tablet Take 1 tablet (10 mg total) by mouth daily. 01/15/23 01/15/24  Gwenevere Abbot, MD  glucose blood (FREESTYLE LITE) test strip Monitor blood sugar at home 06/15/22   Lanae Boast, MD  insulin glargine (LANTUS) 100 UNIT/ML Solostar Pen Inject 15 Units into the skin daily. 01/14/23   Gwenevere Abbot, MD  Insulin Pen Needle 32G X 4 MM MISC Use with Lantus 09/28/22   Lyndle Herrlich, MD  Lancets (FREESTYLE) lancets Use a directed 06/15/22   Lanae Boast, MD   rosuvastatin (CRESTOR) 40 MG tablet Take 1 tablet by mouth once daily 01/03/23   Gwenevere Abbot, MD  sacubitril-valsartan (ENTRESTO) 97-103 MG Take 1 tablet by mouth 2 (two) times daily. 12/30/22 12/30/23  Gwenevere Abbot, MD  spironolactone (ALDACTONE) 25 MG tablet Take 1 tablet (25 mg total) by mouth daily. 01/14/23 01/14/24  Gwenevere Abbot, MD      Allergies    Metformin and related    Review of Systems   Review of Systems  Skin:  Positive for wound.  All other systems reviewed and are negative.   Physical Exam Updated Vital Signs BP (!) 145/81 (BP Location: Left Arm)   Pulse 80   Temp 98.7 F (37.1 C) (Oral)   Resp 16   Ht 6\' 2"  (1.88 m)   Wt 119.3 kg   SpO2 97%   BMI 33.77 kg/m  Physical Exam Vitals and nursing note reviewed.  Constitutional:      Appearance: Normal appearance.  HENT:     Head: Normocephalic and atraumatic.  Eyes:     Conjunctiva/sclera: Conjunctivae normal.  Pulmonary:     Effort: Pulmonary effort is normal. No respiratory distress.  Feet:     Left foot:     Skin integrity: Skin breakdown and erythema present.     Comments: Wound on the dorsal left foot as imaged below, with surrounding erythema and edema of  the ankle going to the lower shin Skin:    General: Skin is warm and dry.  Neurological:     Mental Status: He is alert.  Psychiatric:        Mood and Affect: Mood normal.        Behavior: Behavior normal.    mrs  ED Results / Procedures / Treatments   Labs (all labs ordered are listed, but only abnormal results are displayed) Labs Reviewed  COMPREHENSIVE METABOLIC PANEL - Abnormal; Notable for the following components:      Result Value   Sodium 134 (*)    CO2 21 (*)    Glucose, Bld 224 (*)    BUN 39 (*)    Creatinine, Ser 2.36 (*)    GFR, Estimated 33 (*)    All other components within normal limits  CBC WITH DIFFERENTIAL/PLATELET - Abnormal; Notable for the following components:   RBC 3.97 (*)    Hemoglobin 9.0 (*)    HCT 29.1 (*)     MCV 73.3 (*)    MCH 22.7 (*)    Monocytes Absolute 1.1 (*)    All other components within normal limits  CULTURE, BLOOD (ROUTINE X 2)  CULTURE, BLOOD (ROUTINE X 2)  SEDIMENTATION RATE  C-REACTIVE PROTEIN  PREALBUMIN  I-STAT CG4 LACTIC ACID, ED    EKG None  Radiology DG Foot Complete Left  Result Date: 01/21/2023 CLINICAL DATA:  Concern for osteomyelitis EXAM: LEFT FOOT - COMPLETE 3+ VIEW COMPARISON:  Ankle film 12/06/2022 FINDINGS: Osteotomy site in the proximal fifth metatarsal. Staple within the soft tissue of the great toe along the lateral border at the level of the interphalangeal joint. staple present on comparison radiograph. No osseous erosion to suggest acute osteomyelitis. IMPRESSION: 1. Foreign body (staple) within the soft tissues of the great toe. 2. No evidence of acute osteomyelitis. Electronically Signed   By: Genevive Bi M.D.   On: 01/21/2023 14:56    Procedures Procedures    Medications Ordered in ED Medications  cefTRIAXone (ROCEPHIN) 2 g in sodium chloride 0.9 % 100 mL IVPB (2 g Intravenous New Bag/Given 01/21/23 1456)    And  metroNIDAZOLE (FLAGYL) IVPB 500 mg (500 mg Intravenous New Bag/Given 01/21/23 1458)  aspirin EC tablet 81 mg (has no administration in time range)  amLODipine (NORVASC) tablet 5 mg (has no administration in time range)  carvedilol (COREG) tablet 25 mg (has no administration in time range)  rosuvastatin (CRESTOR) tablet 40 mg (has no administration in time range)  dorzolamide-timolol (COSOPT) 2-0.5 % ophthalmic solution 1 drop (has no administration in time range)  insulin aspart (novoLOG) injection 0-6 Units (has no administration in time range)  insulin aspart (novoLOG) injection 0-5 Units (has no administration in time range)  heparin injection 5,000 Units (has no administration in time range)  acetaminophen (TYLENOL) tablet 650 mg (has no administration in time range)    Or  acetaminophen (TYLENOL) suppository 650 mg (has no  administration in time range)  oxyCODONE (Oxy IR/ROXICODONE) immediate release tablet 5 mg (has no administration in time range)  senna-docusate (Senokot-S) tablet 1 tablet (has no administration in time range)  sodium chloride 0.9 % bolus 1,000 mL (1,000 mLs Intravenous New Bag/Given 01/21/23 1458)    ED Course/ Medical Decision Making/ A&P                                 Medical Decision Making Amount and/or  Complexity of Data Reviewed Labs: ordered. Radiology: ordered.  Risk Decision regarding hospitalization.   This patient is a 52 y.o. male  who presents to the ED for concern of left foot wound.   Differential diagnoses prior to evaluation: The emergent differential diagnosis includes, but is not limited to,  cellulitis, osteomyelitis, sepsis. This is not an exhaustive differential.   Past Medical History / Co-morbidities / Social History: CHF (LVEF 45 to 50%, echo 2023), diabetes, hypertension, stroke, prior left fifth digit amputation,  Additional history: Chart reviewed. Pertinent results include: Patient had left fifth toe amputation performed by Dr. Lajoyce Corners in the setting of osteomyelitis in May 2024  Physical Exam: Physical exam performed. The pertinent findings include: Mildly hypertensive, otherwise normal vital signs.  Afebrile.  Wound to the left dorsal foot with surrounding erythema and edema.  Lab Tests/Imaging studies: I personally interpreted labs/imaging and the pertinent results include: No leukocytosis, stable hemoglobin.  Kidney function mildly elevated, BUN 39, creatinine 2.36, GFR 33.  Lactic acid normal.  Blood cultures pending.  X-ray of the left foot shows no acute evidence of osteomyelitis. I agree with the radiologist interpretation.  Medications: I ordered medication including empiric antibiotics including Rocephin and Flagyl, IVF for AKI.  I have reviewed the patients home medicines and have made adjustments as needed.  Consultations obtained: I  consulted with hospitalist Dr Antionette Char who will admit.    Disposition: After consideration of the diagnostic results and the patients response to treatment, I feel that patient would benefit from medical admission for left foot infection. Hx of diabetes and prior foot amputation due to osteomyelitis and septic joint.  Final Clinical Impression(s) / ED Diagnoses Final diagnoses:  Cellulitis of left foot  AKI (acute kidney injury) (HCC)    Rx / DC Orders ED Discharge Orders     None      Portions of this report may have been transcribed using voice recognition software. Every effort was made to ensure accuracy; however, inadvertent computerized transcription errors may be present.    Jeanella Flattery 01/21/23 1528    Lonell Grandchild, MD 01/21/23 1558

## 2023-01-21 NOTE — Plan of Care (Signed)
Problem: Education: Goal: Knowledge of the prescribed therapeutic regimen will improve Outcome: Progressing Goal: Ability to verbalize activity precautions or restrictions will improve Outcome: Progressing Goal: Understanding of discharge needs will improve Outcome: Progressing   Problem: Activity: Goal: Ability to perform//tolerate increased activity and mobilize with assistive devices will improve Outcome: Progressing   Problem: Clinical Measurements: Goal: Postoperative complications will be avoided or minimized Outcome: Progressing   Problem: Self-Care: Goal: Ability to meet self-care needs will improve Outcome: Progressing   Problem: Self-Concept: Goal: Ability to maintain and perform role responsibilities to the fullest extent possible will improve Outcome: Progressing   Problem: Pain Management: Goal: Pain level will decrease with appropriate interventions Outcome: Progressing   Problem: Skin Integrity: Goal: Demonstration of wound healing without infection will improve Outcome: Progressing   Problem: Education: Goal: Ability to describe self-care measures that may prevent or decrease complications (Diabetes Survival Skills Education) will improve Outcome: Progressing Goal: Individualized Educational Video(s) Outcome: Progressing   Problem: Coping: Goal: Ability to adjust to condition or change in health will improve Outcome: Progressing   Problem: Fluid Volume: Goal: Ability to maintain a balanced intake and output will improve Outcome: Progressing   Problem: Health Behavior/Discharge Planning: Goal: Ability to identify and utilize available resources and services will improve Outcome: Progressing Goal: Ability to manage health-related needs will improve Outcome: Progressing   Problem: Metabolic: Goal: Ability to maintain appropriate glucose levels will improve Outcome: Progressing   Problem: Nutritional: Goal: Maintenance of adequate nutrition will  improve Outcome: Progressing Goal: Progress toward achieving an optimal weight will improve Outcome: Progressing   Problem: Skin Integrity: Goal: Risk for impaired skin integrity will decrease Outcome: Progressing   Problem: Tissue Perfusion: Goal: Adequacy of tissue perfusion will improve Outcome: Progressing   Problem: Education: Goal: Knowledge of General Education information will improve Description: Including pain rating scale, medication(s)/side effects and non-pharmacologic comfort measures Outcome: Progressing   Problem: Health Behavior/Discharge Planning: Goal: Ability to manage health-related needs will improve Outcome: Progressing   Problem: Clinical Measurements: Goal: Ability to maintain clinical measurements within normal limits will improve Outcome: Progressing Goal: Will remain free from infection Outcome: Progressing Goal: Diagnostic test results will improve Outcome: Progressing Goal: Respiratory complications will improve Outcome: Progressing Goal: Cardiovascular complication will be avoided Outcome: Progressing   Problem: Activity: Goal: Risk for activity intolerance will decrease Outcome: Progressing   Problem: Nutrition: Goal: Adequate nutrition will be maintained Outcome: Progressing   Problem: Coping: Goal: Level of anxiety will decrease Outcome: Progressing   Problem: Elimination: Goal: Will not experience complications related to bowel motility Outcome: Progressing Goal: Will not experience complications related to urinary retention Outcome: Progressing   Problem: Pain Managment: Goal: General experience of comfort will improve Outcome: Progressing   Problem: Safety: Goal: Ability to remain free from injury will improve Outcome: Progressing   Problem: Skin Integrity: Goal: Risk for impaired skin integrity will decrease Outcome: Progressing

## 2023-01-21 NOTE — ED Notes (Signed)
ED TO INPATIENT HANDOFF REPORT  Name/Age/Gender Steven Cochran 52 y.o. male  Code Status    Code Status Orders  (From admission, onward)           Start     Ordered   01/21/23 1522  Full code  Continuous       Question:  By:  Answer:  Consent: discussion documented in EHR   01/21/23 1522           Code Status History     Date Active Date Inactive Code Status Order ID Comments User Context   09/18/2022 2335 09/28/2022 2057 Full Code [id number]  Merrilyn Puma, MD ED   06/13/2022 2354 06/15/2022 2113 Full Code [id number]  Dorcas Carrow, MD ED   09/02/2021 0552 09/04/2021 0015 Full Code [id number]  Hillary Bow, DO ED   10/01/2019 1623 10/02/2019 2240 Full Code [id number]  Lorin Glass, MD ED       Home/SNF/Other Home  Chief Complaint Left foot infection [L08.9]  Level of Care/Admitting Diagnosis ED Disposition     ED Disposition  Admit   Condition  --   Comment  Hospital [facility]: [facility] [[id number]]  Level of Care: Med-Surg [16]  May admit patient to [facility] or [facility] if equivalent level of care is available:: Yes  Covid Evaluation: Asymptomatic - no recent exposure (last 10 days) testing not required  Diagnosis: Left foot infection [629528]  Admitting Physician: Briscoe Deutscher [[id number]]  Attending Physician: Briscoe Deutscher [[id number]]  Certification:: I certify this patient will need inpatient services for at least 2 midnights  Expected Medical Readiness: 01/24/2023          Medical History Past Medical History:  Diagnosis Date   Cataract    CHF (congestive heart failure) (HCC)    Diabetes mellitus    Hypertension    Stroke (HCC)     Allergies Allergies  Allergen Reactions   Metformin And Related Diarrhea    IV Location/Drains/Wounds Patient Lines/Drains/Airways Status     Active Line/Drains/Airways     Name Placement date Placement time Site Days   Peripheral IV 01/21/23 20 G 1" Anterior;Distal;Right;Upper Arm  01/21/23  1404  Arm  less than 1   Peripheral IV 01/21/23 20 G 1" Posterior;Right Forearm 01/21/23  1413  Forearm  less than 1   Negative Pressure Wound Therapy Foot Left;Lateral 09/26/22  0951  --  117   Wound / Incision (Open or Dehisced) 06/14/22 Non-pressure wound Foot Left 06/14/22  0210  Foot  221   Wound / Incision (Open or Dehisced) 09/19/22 Diabetic ulcer Foot Anterior;Left 09/19/22  0312  Foot  124   Wound / Incision (Open or Dehisced) 09/22/22 Arm Anterior;Left;Lower 09/22/22  0800  Arm  121            Labs/Imaging Results for orders placed or performed during the hospital encounter of 01/21/23 (from the past 48 hour(s))  Comprehensive metabolic panel     Status: Abnormal   Collection Time: 01/21/23 11:40 AM  Result Value Ref Range   Sodium 134 (L) 135 - 145 mmol/L   Potassium 4.4 3.5 - 5.1 mmol/L   Chloride 102 98 - 111 mmol/L   CO2 21 (L) 22 - 32 mmol/L   Glucose, Bld 224 (H) 70 - 99 mg/dL    Comment: Glucose reference range applies only to samples taken after fasting for at least 8 hours.   BUN 39 (H) 6 - 20 mg/dL   Creatinine,  Ser 2.36 (H) 0.61 - 1.24 mg/dL   Calcium 8.9 8.9 - 69.6 mg/dL   Total Protein 7.7 6.5 - 8.1 g/dL   Albumin 3.9 3.5 - 5.0 g/dL   AST 23 15 - 41 U/L   ALT 26 0 - 44 U/L   Alkaline Phosphatase 83 38 - 126 U/L   Total Bilirubin 0.7 0.3 - 1.2 mg/dL   GFR, Estimated 33 (L) >60 mL/min    Comment: (NOTE) Calculated using the CKD-EPI Creatinine Equation (2021)    Anion gap 11 5 - 15    Comment: Performed at [facility], [address] [place], [place] [address]  CBC with Differential     Status: Abnormal   Collection Time: 01/21/23 11:40 AM  Result Value Ref Range   WBC 9.7 4.0 - 10.5 K/uL   RBC 3.97 (L) 4.22 - 5.81 MIL/uL   Hemoglobin 9.0 (L) 13.0 - 17.0 g/dL   HCT 41.3 (L) 24.4 - 01.0 %   MCV 73.3 (L) 80.0 - 100.0 fL   MCH 22.7 (L) 26.0 - 34.0 pg   MCHC 30.9 30.0 - 36.0 g/dL   RDW 27.2 53.6 - 64.4 %   Platelets 239  150 - 400 K/uL   nRBC 0.0 0.0 - 0.2 %   Neutrophils Relative % 72 %   Neutro Abs 6.9 1.7 - 7.7 K/uL   Lymphocytes Relative 17 %   Lymphs Abs 1.6 0.7 - 4.0 K/uL   Monocytes Relative 11 %   Monocytes Absolute 1.1 (H) 0.1 - 1.0 K/uL   Eosinophils Relative 0 %   Eosinophils Absolute 0.0 0.0 - 0.5 K/uL   Basophils Relative 0 %   Basophils Absolute 0.0 0.0 - 0.1 K/uL   Immature Granulocytes 0 %   Abs Immature Granulocytes 0.03 0.00 - 0.07 K/uL    Comment: Performed at [facility], [address], [place], [place] [address]  I-Stat Lactic Acid, ED     Status: None   Collection Time: 01/21/23  2:04 PM  Result Value Ref Range   Lactic Acid, Venous 0.5 0.5 - 1.9 mmol/L   DG Foot Complete Left  Result Date: 01/21/2023 CLINICAL DATA:  Concern for osteomyelitis EXAM: LEFT FOOT - COMPLETE 3+ VIEW COMPARISON:  Ankle film 12/06/2022 FINDINGS: Osteotomy site in the proximal fifth metatarsal. Staple within the soft tissue of the great toe along the lateral border at the level of the interphalangeal joint. staple present on comparison radiograph. No osseous erosion to suggest acute osteomyelitis. IMPRESSION: 1. Foreign body (staple) within the soft tissues of the great toe. 2. No evidence of acute osteomyelitis. Electronically Signed   By: Genevive Bi M.D.   On: 01/21/2023 14:56    Pending Labs Unresulted Labs (From admission, onward)     Start     Ordered   01/22/23 0500  HIV Antibody (routine testing w rflx)  (HIV Antibody (Routine testing w reflex) panel)  Tomorrow morning,   R        01/21/23 1522   01/22/23 0500  Basic metabolic panel  Daily,   R      01/21/23 1522   01/22/23 0500  CBC  Daily,   R      01/21/23 1522   01/21/23 1530  Urea nitrogen, urine  Once,   R        01/21/23 1530   01/21/23 1529  Urinalysis, Complete w Microscopic -Urine, Clean Catch  Once,   R  Question:  Specimen Source  Answer:  Urine, Clean Catch   01/21/23 1530   01/21/23 1529  Sodium,  urine, random  Once,   R        01/21/23 1530   01/21/23 1529  Creatinine, urine, random  Once,   R        01/21/23 1530   01/21/23 1521  Sedimentation rate  Once,   R        01/21/23 1522   01/21/23 1521  C-reactive protein  Once,   R        01/21/23 1522   01/21/23 1521  Prealbumin  Once,   R        01/21/23 1522   01/21/23 1254  Blood Cultures x 2 sites  BLOOD CULTURE X 2,   STAT      01/21/23 1258            Vitals/Pain Today's Vitals   01/21/23 1131 01/21/23 1235  BP:  (!) 145/81  Pulse:  80  Resp:  16  Temp:  98.7 F (37.1 C)  TempSrc:  Oral  SpO2:  97%  Weight: 263 lb (119.3 kg)   Height: 6\' 2"  (1.88 m)   PainSc: 0-No pain     Isolation Precautions No active isolations  Medications Medications  cefTRIAXone (ROCEPHIN) 2 g in sodium chloride 0.9 % 100 mL IVPB (2 g Intravenous New Bag/Given 01/21/23 1456)    And  metroNIDAZOLE (FLAGYL) IVPB 500 mg (500 mg Intravenous New Bag/Given 01/21/23 1458)  aspirin EC tablet 81 mg (has no administration in time range)  amLODipine (NORVASC) tablet 5 mg (has no administration in time range)  carvedilol (COREG) tablet 25 mg (has no administration in time range)  rosuvastatin (CRESTOR) tablet 40 mg (has no administration in time range)  dorzolamide-timolol (COSOPT) 2-0.5 % ophthalmic solution 1 drop (has no administration in time range)  insulin aspart (novoLOG) injection 0-6 Units (has no administration in time range)  insulin aspart (novoLOG) injection 0-5 Units (has no administration in time range)  heparin injection 5,000 Units (has no administration in time range)  acetaminophen (TYLENOL) tablet 650 mg (has no administration in time range)    Or  acetaminophen (TYLENOL) suppository 650 mg (has no administration in time range)  oxyCODONE (Oxy IR/ROXICODONE) immediate release tablet 5 mg (has no administration in time range)  senna-docusate (Senokot-S) tablet 1 tablet (has no administration in time range)  linezolid  (ZYVOX) IVPB 600 mg (has no administration in time range)  sodium chloride 0.9 % bolus 1,000 mL (1,000 mLs Intravenous New Bag/Given 01/21/23 1458)    Mobility walks

## 2023-01-22 DIAGNOSIS — D509 Iron deficiency anemia, unspecified: Secondary | ICD-10-CM | POA: Diagnosis not present

## 2023-01-22 DIAGNOSIS — E11621 Type 2 diabetes mellitus with foot ulcer: Secondary | ICD-10-CM

## 2023-01-22 DIAGNOSIS — Z794 Long term (current) use of insulin: Secondary | ICD-10-CM | POA: Diagnosis not present

## 2023-01-22 DIAGNOSIS — Z89422 Acquired absence of other left toe(s): Secondary | ICD-10-CM | POA: Diagnosis not present

## 2023-01-22 DIAGNOSIS — M86172 Other acute osteomyelitis, left ankle and foot: Secondary | ICD-10-CM

## 2023-01-22 DIAGNOSIS — I5022 Chronic systolic (congestive) heart failure: Secondary | ICD-10-CM | POA: Diagnosis not present

## 2023-01-22 DIAGNOSIS — N179 Acute kidney failure, unspecified: Secondary | ICD-10-CM | POA: Diagnosis not present

## 2023-01-22 LAB — BASIC METABOLIC PANEL
Anion gap: 9 (ref 5–15)
BUN: 30 mg/dL — ABNORMAL HIGH (ref 6–20)
CO2: 23 mmol/L (ref 22–32)
Calcium: 8.4 mg/dL — ABNORMAL LOW (ref 8.9–10.3)
Chloride: 105 mmol/L (ref 98–111)
Creatinine, Ser: 1.82 mg/dL — ABNORMAL HIGH (ref 0.61–1.24)
GFR, Estimated: 44 mL/min — ABNORMAL LOW (ref >60)
Glucose, Bld: 101 mg/dL — ABNORMAL HIGH (ref 70–99)
Potassium: 4.1 mmol/L (ref 3.5–5.1)
Sodium: 137 mmol/L (ref 135–145)

## 2023-01-22 LAB — CBC
HCT: 28.1 % — ABNORMAL LOW (ref 39.0–52.0)
Hemoglobin: 8.4 g/dL — ABNORMAL LOW (ref 13.0–17.0)
MCH: 22.1 pg — ABNORMAL LOW (ref 26.0–34.0)
MCHC: 29.9 g/dL — ABNORMAL LOW (ref 30.0–36.0)
MCV: 73.9 fL — ABNORMAL LOW (ref 80.0–100.0)
Platelets: 244 10*3/uL (ref 150–400)
RBC: 3.8 MIL/uL — ABNORMAL LOW (ref 4.22–5.81)
RDW: 15.4 % (ref 11.5–15.5)
WBC: 7.2 10*3/uL (ref 4.0–10.5)
nRBC: 0 % (ref 0.0–0.2)

## 2023-01-22 LAB — GLUCOSE, CAPILLARY
Glucose-Capillary: 101 mg/dL — ABNORMAL HIGH (ref 70–99)
Glucose-Capillary: 161 mg/dL — ABNORMAL HIGH (ref 70–99)
Glucose-Capillary: 143 mg/dL — ABNORMAL HIGH (ref 70–99)
Glucose-Capillary: 166 mg/dL — ABNORMAL HIGH (ref 70–99)

## 2023-01-22 LAB — HIV ANTIBODY (ROUTINE TESTING W REFLEX): HIV Screen 4th Generation wRfx: NONREACTIVE

## 2023-01-22 MED ORDER — JUVEN PO PACK
1.0000 | PACK | Freq: Two times a day (BID) | ORAL | Status: DC
  Administered 2023-01-22 – 2023-01-23 (×2): 1 via ORAL
  Filled 2023-01-22 (×2): qty 1

## 2023-01-22 MED ORDER — VITAMIN C 500 MG PO TABS
500.0000 mg | ORAL_TABLET | Freq: Two times a day (BID) | ORAL | Status: DC
  Administered 2023-01-22 – 2023-01-23 (×3): 500 mg via ORAL
  Filled 2023-01-22 (×3): qty 1

## 2023-01-22 MED ORDER — SODIUM CHLORIDE 0.9 % IV SOLN
8.0000 mg/kg | Freq: Every day | INTRAVENOUS | Status: DC
  Administered 2023-01-22: 800 mg via INTRAVENOUS
  Filled 2023-01-22 (×3): qty 16

## 2023-01-22 MED ORDER — ADULT MULTIVITAMIN W/MINERALS CH
1.0000 | ORAL_TABLET | Freq: Every day | ORAL | Status: DC
  Administered 2023-01-22 – 2023-01-23 (×2): 1 via ORAL
  Filled 2023-01-22 (×2): qty 1

## 2023-01-22 MED ORDER — ZINC SULFATE 220 (50 ZN) MG PO CAPS
220.0000 mg | ORAL_CAPSULE | Freq: Every day | ORAL | Status: DC
  Administered 2023-01-22 – 2023-01-23 (×2): 220 mg via ORAL
  Filled 2023-01-22 (×2): qty 1

## 2023-01-22 MED ORDER — ENSURE MAX PROTEIN PO LIQD
11.0000 [oz_av] | Freq: Two times a day (BID) | ORAL | Status: DC
  Administered 2023-01-22 – 2023-01-23 (×2): 11 [oz_av] via ORAL
  Filled 2023-01-22 (×4): qty 330

## 2023-01-22 NOTE — Assessment & Plan Note (Signed)
-   Iron studies checked on 01/14/2023 and in normal ranges -Baseline hemoglobin appears around 8 to 9 g/dL - Currently at baseline - Monitor especially postop with expected blood loss

## 2023-01-22 NOTE — Plan of Care (Signed)
  Problem: Education: Goal: Knowledge of the prescribed therapeutic regimen will improve Outcome: Progressing Goal: Ability to verbalize activity precautions or restrictions will improve Outcome: Progressing   Problem: Activity: Goal: Ability to perform//tolerate increased activity and mobilize with assistive devices will improve Outcome: Progressing   Problem: Pain Management: Goal: Pain level will decrease with appropriate interventions Outcome: Progressing   Problem: Education: Goal: Ability to describe self-care measures that may prevent or decrease complications (Diabetes Survival Skills Education) will improve Outcome: Progressing   Problem: Nutritional: Goal: Maintenance of adequate nutrition will improve Outcome: Progressing Goal: Progress toward achieving an optimal weight will improve Outcome: Progressing   Problem: Tissue Perfusion: Goal: Adequacy of tissue perfusion will improve Outcome: Progressing   Problem: Education: Goal: Knowledge of General Education information will improve Description: Including pain rating scale, medication(s)/side effects and non-pharmacologic comfort measures Outcome: Progressing   Problem: Clinical Measurements: Goal: Postoperative complications will be avoided or minimized Outcome: Not Progressing   Problem: Self-Concept: Goal: Ability to maintain and perform role responsibilities to the fullest extent possible will improve Outcome: Not Progressing   Problem: Skin Integrity: Goal: Demonstration of wound healing without infection will improve Outcome: Not Progressing   Problem: Coping: Goal: Ability to adjust to condition or change in health will improve Outcome: Not Progressing   Problem: Skin Integrity: Goal: Risk for impaired skin integrity will decrease Outcome: Not Progressing   Problem: Coping: Goal: Level of anxiety will decrease Outcome: Not Progressing

## 2023-01-22 NOTE — Consult Note (Signed)
WOC Nurse Consult Note: Reason for Consult: left foot wound Patient with history of left too amputation, DM, CHF.  Wound type: full thickness wound dorsal foot MRI IMPRESSION: 1. Postsurgical changes from transmetatarsal amputation of the fifth ray at the level of the proximal fifth metatarsal diaphysis. Osteomyelitis of the residual fifth metatarsal. 2. Open wound over the dorsum of the midfoot with patchy bone marrow edema throughout the bones of the midfoot, particularly affecting the navicular, medial cuneiform, and intermediate cuneiform bones. This is favored to represent early acute osteomyelitis. 3. Additionally, milder bone marrow edema is present within the cuboid and the bases of the second through fourth metatarsals. Findings are favored to represent reactive osteitis. 4. Moderate tenosynovitis of the tibialis anterior and extensor hallucis longus tendons at the dorsum of the ankle and midfoot. 5. Diffuse edema-like signal of the foot musculature which may represent changes related to denervation and/or myositis. Pressure Injury POA: NA Measurement: see nursing flow sheets Wound bed: dry, non viable tissue Drainage (amount, consistency, odor) none Periwound:edema Dressing procedure/placement/frequency: Will add topical care only. Cleanse with saline, pat dry Cover with single layer of xeroform gauze, dry dressing, kerlix. Change daily.  Consider podeitry or orthopedic referrral, WOC Nurse has reviewed record and this patient has a positive xray or MRI for osteomyelitis, this is considered outside of the scope of practice for the WOC nurse   Re-consult if only other topical wound care needed after orthopedic or surgical evaluation Teela Narducci [facility] MSN, RN, Marissa, CNS, CWON-AP [phone]   Irem Stoneham Eliberto Ivory MSN, RN, CWOCN, CNS, CWON-AP [phone]       I

## 2023-01-22 NOTE — Assessment & Plan Note (Addendum)
-   despite dorsal wound, may have had underlying or prior wound longer than reported - MRI left foot obtained 9/16: notes OM of residual 5th MT and also concern for early acute OM of mid foot - patient unfortunately very apprehensive on further amputation/surgery. Empathetic listening provided but he may still need further assurance and possibly even 2nd opinion from ID that further debridement and/or amputation necessary for definitive treatment  - continue zyvox/rocephin/flagyl for now - Dr. Lajoyce Corners aware; patient will be tx to [facility] for ongoing evaluation  - CRP 11.7 and ESR 68

## 2023-01-22 NOTE — Assessment & Plan Note (Signed)
-   continue amlodipine and coreg for now - resume Entresto and spironolactone as able

## 2023-01-22 NOTE — Assessment & Plan Note (Signed)
-   No obvious residual deficits - On aspirin daily

## 2023-01-22 NOTE — Consult Note (Signed)
ORTHOPAEDIC CONSULTATION  REQUESTING PHYSICIAN: Lewie Chamber, MD  Chief Complaint: Necrotic ulceration dorsum of the left foot.  HPI: Steven Cochran is a 52 y.o. male who presents with necrotic ulceration dorsum of the left foot.  Patient is status post a left foot fifth ray amputation that is healed well.  Patient feels like he may have wrapped the dressing too tight causing the ulceration on the left foot.  Patient is 4 months status post fifth ray amputation left foot.  Past Medical History:  Diagnosis Date   Cataract    CHF (congestive heart failure) (HCC)    Diabetes mellitus    Hypertension    Stroke [facility])    Past Surgical History:  Procedure Laterality Date   AMPUTATION Left 09/26/2022   Procedure: LEFT FOOT 5TH RAY AMPUTATION;  Surgeon: Nadara Mustard, MD;  Location: [facility] OR;  Service: Orthopedics;  Laterality: Left;   CATARACT EXTRACTION Bilateral    SKIN GRAFT     feet   Social History   Socioeconomic History   Marital status: Single    Spouse name: Not on file   Number of children: Not on file   Years of education: Not on file   Highest education level: Not on file  Occupational History   Not on file  Tobacco Use   Smoking status: Never   Smokeless tobacco: Never  Vaping Use   Vaping status: Never Used  Substance and Sexual Activity   Alcohol use: No   Drug use: No   Sexual activity: Not Currently    Partners: Female    Birth control/protection: None  Other Topics Concern   Not on file  Social History Narrative   Not on file   Social Determinants of Health   Financial Resource Strain: Low Risk  (07/12/2022)   Overall Financial Resource Strain (CARDIA)    Difficulty of Paying Living Expenses: Not hard at all  Food Insecurity: No Food Insecurity (01/14/2023)   Hunger Vital Sign    Worried About Running Out of Food in the Last Year: Never true    Ran Out of Food in the Last Year: Never true  Transportation Needs: No Transportation Needs (01/14/2023)    PRAPARE - Administrator, Civil Service (Medical): No    Lack of Transportation (Non-Medical): No  Physical Activity: Sufficiently Active (07/12/2022)   Exercise Vital Sign    Days of Exercise per Week: 5 days    Minutes of Exercise per Session: 40 min  Stress: No Stress Concern Present (07/12/2022)   [employer] of Occupational Health - Occupational Stress Questionnaire    Feeling of Stress : Not at all  Social Connections: Socially Isolated (01/14/2023)   Social Connection and Isolation Panel [NHANES]    Frequency of Communication with Friends and Family: Twice a week    Frequency of Social Gatherings with Friends and Family: Never    Attends Religious Services: Never    Database administrator or Organizations: No    Attends Engineer, structural: Never    Marital Status: Divorced   Family History  Problem Relation Age of Onset   Colon cancer Neg Hx    Esophageal cancer Neg Hx    Stomach cancer Neg Hx    Rectal cancer Neg Hx    - negative except otherwise stated in the family history section Allergies  Allergen Reactions   Metformin And Related Diarrhea   Prior to Admission medications   Medication Sig Start  Date End Date Taking? Authorizing Provider  amLODipine (NORVASC) 5 MG tablet Take 1 tablet (5 mg total) by mouth daily. 12/30/22  Yes Gwenevere Abbot, MD  aspirin EC 81 MG EC tablet Take 1 tablet (81 mg total) by mouth daily. Swallow whole. 09/04/21  Yes Leroy Sea, MD  carvedilol (COREG) 25 MG tablet Take 1 tablet (25 mg total) by mouth 2 (two) times daily. 12/30/22 04/29/23 Yes Gwenevere Abbot, MD  dorzolamide-timolol (COSOPT) 2-0.5 % ophthalmic solution Place 2 drops into the right eye 2 (two) times daily. 11/12/22  Yes [provider]  empagliflozin (JARDIANCE) 25 MG TABS tablet Take 1 tablet (25 mg total) by mouth daily. 12/30/22  Yes Gwenevere Abbot, MD  insulin glargine (LANTUS) 100 UNIT/ML Solostar Pen Inject 15 Units into the skin daily.  01/14/23  Yes Gwenevere Abbot, MD  rosuvastatin (CRESTOR) 40 MG tablet Take 1 tablet by mouth once daily 01/03/23  Yes Gwenevere Abbot, MD  sacubitril-valsartan (ENTRESTO) 97-103 MG Take 1 tablet by mouth 2 (two) times daily. 12/30/22 12/30/23 Yes Gwenevere Abbot, MD  spironolactone (ALDACTONE) 25 MG tablet Take 1 tablet (25 mg total) by mouth daily. 01/14/23 01/14/24 Yes Gwenevere Abbot, MD  ezetimibe (ZETIA) 10 MG tablet Take 1 tablet (10 mg total) by mouth daily. Patient not taking: Reported on 01/22/2023 01/15/23 01/15/24  Gwenevere Abbot, MD  glucose blood (FREESTYLE LITE) test strip Monitor blood sugar at home 06/15/22   Lanae Boast, MD   MR FOOT LEFT W WO CONTRAST  Result Date: 01/21/2023 CLINICAL DATA:  Open wound of left foot with redness and swelling EXAM: MRI OF THE LEFT FOOT WITHOUT AND WITH CONTRAST TECHNIQUE: Multiplanar, multisequence MR imaging of the left hindfoot was performed both before and after administration of intravenous contrast. CONTRAST:  10mL GADAVIST GADOBUTROL 1 MMOL/ML IV SOLN COMPARISON:  X-ray 01/21/2023 FINDINGS: Bones/Joint/Cartilage Postsurgical changes from transmetatarsal amputation of the fifth ray at the level of the proximal fifth metatarsal diaphysis. There is bone marrow edema with confluent low T1 marrow signal throughout the residual fifth metatarsal. Patchy bone marrow edema throughout the bones of the midfoot, particularly affecting the navicular, medial cuneiform, and intermediate cuneiform bones. There is intermediate T1 marrow signal within these bones. Additionally, milder bone marrow edema with preserved T1 marrow signal is present within the cuboid and the bases of the second through fourth metatarsals. No acute fracture or dislocation.  No sizable joint effusion. Ligaments Intact Lisfranc ligament. Intact medial and lateral ankle ligaments. Thickening of the medial band of the plantar fascia. Muscles and Tendons Moderate tenosynovitis of the tibialis anterior and extensor hallucis  longus tendons at the dorsum of the ankle and midfoot. Diffuse edema-like signal of the foot musculature which may represent changes related to denervation and/or myositis. Soft tissues Large open wound over the dorsum of the midfoot. Diffuse soft tissue edema. No drainable fluid collections. IMPRESSION: 1. Postsurgical changes from transmetatarsal amputation of the fifth ray at the level of the proximal fifth metatarsal diaphysis. Osteomyelitis of the residual fifth metatarsal. 2. Open wound over the dorsum of the midfoot with patchy bone marrow edema throughout the bones of the midfoot, particularly affecting the navicular, medial cuneiform, and intermediate cuneiform bones. This is favored to represent early acute osteomyelitis. 3. Additionally, milder bone marrow edema is present within the cuboid and the bases of the second through fourth metatarsals. Findings are favored to represent reactive osteitis. 4. Moderate tenosynovitis of the tibialis anterior and extensor hallucis longus tendons at the dorsum of the ankle  and midfoot. 5. Diffuse edema-like signal of the foot musculature which may represent changes related to denervation and/or myositis. Electronically Signed   By: Duanne Guess D.O.   On: 01/21/2023 18:38   US RENAL  Result Date: 01/21/2023 CLINICAL DATA:  Acute renal failure EXAM: RENAL / URINARY TRACT ULTRASOUND COMPLETE COMPARISON:  None Available. FINDINGS: Right Kidney: Renal measurements: 12.1 x 5.4 x 5.8 cm = volume: 197.2 mL. Echogenicity within normal limits. No mass or hydronephrosis visualized. Left Kidney: Renal measurements: 11.6 x 5.4 x 6.4 cm = volume: 208.7 mL. Echogenicity within normal limits. No mass or hydronephrosis visualized. Bladder: Ureteral jets are seen. Slight urinary bladder wall thickening and trabeculation. Other: None. IMPRESSION: No collecting system dilatation. Electronically Signed   By: Karen Kays M.D.   On: 01/21/2023 16:41   DG Foot Complete  Left  Result Date: 01/21/2023 CLINICAL DATA:  Concern for osteomyelitis EXAM: LEFT FOOT - COMPLETE 3+ VIEW COMPARISON:  Ankle film 12/06/2022 FINDINGS: Osteotomy site in the proximal fifth metatarsal. Staple within the soft tissue of the great toe along the lateral border at the level of the interphalangeal joint. staple present on comparison radiograph. No osseous erosion to suggest acute osteomyelitis. IMPRESSION: 1. Foreign body (staple) within the soft tissues of the great toe. 2. No evidence of acute osteomyelitis. Electronically Signed   By: Genevive Bi M.D.   On: 01/21/2023 14:56   - pertinent xrays, CT, MRI studies were reviewed and independently interpreted  Positive ROS: All other systems have been reviewed and were otherwise negative with the exception of those mentioned in the HPI and as above.  Physical Exam: General: Alert, no acute distress Psychiatric: Patient is competent for consent with normal mood and affect Lymphatic: No axillary or cervical lymphadenopathy Cardiovascular: No pedal edema Respiratory: No cyanosis, no use of accessory musculature GI: No organomegaly, abdomen is soft and non-tender    Images:  @ENCIMAGES @  Labs:  Lab Results  Component Value Date   HGBA1C 8.1 (A) 01/14/2023   HGBA1C 8.5 (A) 08/29/2022   HGBA1C 11.7 (H) 06/14/2022   ESRSEDRATE 68 (H) 01/21/2023   ESRSEDRATE 47 (H) 06/15/2022   ESRSEDRATE 46 (H) 06/13/2022   CRP 11.7 (H) 01/21/2023   CRP <0.5 06/15/2022   REPTSTATUS PENDING 01/21/2023   CULT  01/21/2023    NO GROWTH < 12 HOURS Performed at [facility], [address] [place], [place] [address]     Lab Results  Component Value Date   ALBUMIN 3.9 01/21/2023   ALBUMIN 3.9 12/06/2022   ALBUMIN 2.9 (L) 06/15/2022   PREALBUMIN 10 (L) 01/21/2023        Latest Ref Rng & Units 01/22/2023    5:51 AM 01/21/2023   11:40 AM 12/06/2022   12:06 AM  CBC EXTENDED  WBC 4.0 - 10.5 K/uL 7.2  9.7  4.5   RBC 4.22 - 5.81  MIL/uL 3.80  3.97  4.26   Hemoglobin 13.0 - 17.0 g/dL 8.4  9.0  9.1   HCT 60.4 - 52.0 % 28.1  29.1  30.1   Platelets 150 - 400 K/uL 244  239  255   NEUT# 1.7 - 7.7 K/uL  6.9  1.7   Lymph# 0.7 - 4.0 K/uL  1.6  2.3     Neurologic: Patient does not have protective sensation bilateral lower extremities.   MUSCULOSKELETAL:   Skin: Examination patient has a full-thickness necrotic ulcer dorsally over the left ankle.  Patient has no ascending cellulitis.  Patient has a palpable posterior tibial and anterior tibial pulse.  Review of the MRI scan shows osteomyelitis through the navicular and cuneiform bones.  With osteomyelitis involving the residual fifth metatarsal.  Assessment: Necrotic ulceration dorsum of the left foot with osteomyelitis involving the midfoot.  Plan: Recommended proceeding with a left transtibial amputation.  Patient does not have sufficient soft tissue or sufficient noninvolved bone to proceed with any type of foot salvage intervention.  Patient states that he needs some time to get his apartment changed and get his affairs in order.  Discussed that he could be discharged on oral antibiotics as per infectious disease recommendations, I will follow-up in the office in 1 month and anticipate proceeding with left BKA amputation surgery in 2 months.  Thank you for the consult and the opportunity to see Mr. Jarvis  Aldean Baker, MD [facility] [phone] 5:45 PM

## 2023-01-22 NOTE — Assessment & Plan Note (Signed)
-   compensated; no s/s exac - last echo: EF 45-50%, grade 1 DD on 09/02/21 - doubt needs cards clearance prior to surgery; METs easily 4+ and no concern at this time for decompensation - on jardiance, entresto, aldactone, asa, coreg, crestor at home - currently on coreg; remainder GDMT on hold in setting of AoCKD on admission; resume as able

## 2023-01-22 NOTE — Progress Notes (Cosign Needed Addendum)
Summary: Patient is a 52 y.o. with a past medical history of T2DM c/b neuropathy, CKD 3a, CHF, HTN, HLD, Hx of CVA, and anemia who presents as a transfer from [facility] for further surgical evaluation of acute osteomyelitis of the left foot. IMTS will assume care from [facility] hospitalists.   Per chart review:  - Recurrent osteomyelitis after transmetatarsal amputation of the left fifth toe (09/26/22). - Admitted to [facility] 9/16 for 2-3 days of increased swelling associated with a wound on the dorsal surface of his left foot.   - Patient evaluated by Dr. Lajoyce Corners (orthopedic surgery) and Dr. Daiva Eves (infectious disease). - Patient expressed he wanted to go home with PO/IV antibiotics but was explicitly told infection required surgical intervention.  - Initially placed on ceftriaxone, linezolid, and metronidazole, switched to daptomycin, ceftriaxone, and metronidazole on 9/17 per Dr. Daiva Eves.   Subjective:  Patient seen at bedside. Confirmed he went in to [facility] for increased swelling, minimal discharge/pain of left foot.   States he is feeling alright, denies any left foot pain or new acute concerns. States he's had decreased sensation of left foot for some time. Per patient, discussed surgical intervention with Dr. Lajoyce Corners and is willing to proceed but would like to delay surgery for ~1 month to move from a third floor apartment to a floor unit and get his affairs in order. Denies having any social support in the area.   On review of systems patient denies SOB, CP, headache, fever, rash, N/V, or diarrhea. Reports he ate dinner without issue and has been voiding appropriately. Does not use a cane or walker to help ambulate.   Objective:  Vital signs in last 24 hours: Vitals:   01/22/23 0102 01/22/23 0500 01/22/23 0522 01/22/23 1233  BP: (!) 154/76  (!) 141/71 (!) 148/77  Pulse: 80  86 76  Resp: 17  17 16   Temp: 99 F (37.2 C)  99.9 F (37.7 C) 98.6 F (37 C)  TempSrc: Oral  Oral    SpO2: 99%  99% 99%  Weight:  122.1 kg    Height:          Latest Ref Rng & Units 01/22/2023    5:51 AM 01/21/2023   11:40 AM 12/06/2022   12:06 AM  CBC  WBC 4.0 - 10.5 K/uL 7.2  9.7  4.5   Hemoglobin 13.0 - 17.0 g/dL 8.4  9.0  9.1   Hematocrit 39.0 - 52.0 % 28.1  29.1  30.1   Platelets 150 - 400 K/uL 244  239  255       Latest Ref Rng & Units 01/22/2023    5:51 AM 01/21/2023   11:40 AM 01/01/2023   11:55 AM  BMP  Glucose 70 - 99 mg/dL 347  425  956   BUN 6 - 20 mg/dL 30  39  14   Creatinine 0.61 - 1.24 mg/dL 3.87  5.64  3.32   Sodium 135 - 145 mmol/L 137  134  138   Potassium 3.5 - 5.1 mmol/L 4.1  4.4  4.1   Chloride 98 - 111 mmol/L 105  102  105   CO2 22 - 32 mmol/L 23  21  26    Calcium 8.9 - 10.3 mg/dL 8.4  8.9  8.9     HIV non-reactive Mg needs to be collected CK needs to be collected CBG 143  Physical Exam  Constitutional: Patient is resting in bed comfortably, answering questions appropriately, cooperative CV:  Regular rate and rhythm without murmurs on auscultation. No LE edema.  Pulmonary/Respiratory: Clear lungs bilaterally, normal respiratory effort on room air.  Abdominal: Soft, non-tender, non-distended, positive bowel sounds. MSK: Left foot bandaged, some dry non-purulent drainage noted. No surrounding erythema/edema/strong odor noted. No pain on palpation. Able to spontaneously move toes on both feet when prompted.  Psych: Normal mood and affect.  Assessment/Plan:  Principal Problem:   Acute osteomyelitis of left foot (HCC) Active Problems:   Type II diabetes mellitus with neurological manifestations (HCC)   Chronic systolic CHF (congestive heart failure) (HCC)   Microcytic anemia   Essential hypertension   Acute renal failure superimposed on stage 2 chronic kidney disease (HCC)   History of stroke  Acute osteomyelitis of the left foot Afebrile, HDS, no concern for sepsis. MRI from 9/16 shows osteomyelitis of the residual fifth metatarsal and open wound  over dorsum of left midfoot favored with patchy edema favored to represent early acute osteomyelitis. ABIs WNL b/l May 2024. Wound covered with bandage with minimal dry non-purulent drainage, minimal to no pain on physical exam.  - Reach out to orthopedic surgery in AM, will need to discuss if surgery will be delayed and plan to transition from IV abx to PO if patient going home - Continue IV ceftriaxone, daptomycin, and metronidazole per ID recommendations - PRN Tylenol 650 mg q6H and oxycodone 5 mg q4H for pain  - Follow up Mg, CK - Monitor daily labs  - Follow up wound care recommendations  AKI  History of CKD  Serum creatinine was 2.36 on admission, up from 1.38 June 2024. Given 1 L NS at [facility] ED. Cr from 9/17 improved to 1.82.   - Holding spironolactone and jardiance home meds  - Monitor Cr with daily BMP    Chronic HFrEF  Appears euvolemic on exam. Normal respiratory effort on room air, laying flat in bed without SOB. CV exam unremarkable. Holding aldactone and jardiance for AKI.  - Continue carvedilol - Monitor volume status, consider restarting HF home meds if volume overload   T2DM  Insulin dependent, A1c 8.1% 01/14/23.  - Monitor CBGs - Very sensitive SSI    HTN  Blood pressure 127/71. Mildly elevated at SBP 140s-150s at WL, HR 70s-80s.  - Continue carvedilol, amlodipine - BP well controlled, but if BP becomes elevated consider restarting other HTN home meds   History of CVA  - Continue aspirin and rosuvastatin    Anemia  Baseline Hgb 8.3-9.1. Hgb 8.4 today. No overt bleeding on exam. Monitor.   DVT prophylaxis: sq heparin  Diet: Heart Healthy/Carb Modified Code status: Full Code Prior to Admission Living Arrangement: Home Anticipated Discharge Location: Home Barriers to Discharge: Pending medical/surgical management Dispo: Pending  Philomena Doheny, MD, PGY-1 01/22/2023, 8:11 PM Pager: [phone] After 5pm on weekdays and 1pm on weekends: On Call pager  [phone]

## 2023-01-22 NOTE — Progress Notes (Signed)
Progress Note    Steven Cochran   VHQ:[id number]  DOB: 08/12/1970  DOA: 01/21/2023     1 PCP: Gwenevere Abbot, MD  Initial CC: left foot wound  Hospital Course: Mr. Blakely is a 52 yo male with PMH CHF, DMII, HTN, CVA who presented with left foot pain and a non healing wound.  He was hospitalized in May 2024 for osteomyelitis of the left foot fifth toe and underwent fifth ray amputation on 09/26/2022. He has been recovering well since but had recently developed a wound on the dorsal surface of his foot which has not healed well. He underwent MRI of the foot on admission which shows osteomyelitis of the residual fifth metatarsal along with further evidence of concern for early acute osteomyelitis involving the midfoot.  He was started on Rocephin and Flagyl on admission.  Case was discussed with Dr. Lajoyce Corners and patient was recommended for transferring to [facility] for further evaluation.  Interval History:  No events overnight.  Patient resting in bed comfortably when seen this morning.  Reviewed findings of MRI foot with him.  He was grossly upset over results of MRI.  Discussed probable need for some sort of surgical intervention likely amputation. He was asking if abx alone would cure infection and continued to voice frustration over treatment options.  Discussed with Dr. Lajoyce Corners as well and patient recommended for transfer to [facility] for further evaluation as likely needs surgery.   Patient reported off to [facility]MT[facility] for acceptance upon transfer.   Assessment and Plan: * Acute osteomyelitis of left foot (HCC) - despite dorsal wound, may have had underlying or prior wound longer than reported - MRI left foot obtained 9/16: notes OM of residual 5th MT and also concern for early acute OM of mid foot - patient unfortunately very apprehensive on further amputation/surgery. Empathetic listening provided but he may still need further assurance and possibly even 2nd opinion from ID that further debridement and/or  amputation necessary for definitive treatment  - continue zyvox/rocephin/flagyl for now - Dr. Lajoyce Corners aware; patient will be tx to [facility] for ongoing evaluation  - CRP 11.7 and ESR 68  Acute renal failure superimposed on stage 2 chronic kidney disease (HCC) - patient has history of CKD2 most likely. Baseline creat ~ 1.3. Suspect GFR between 60 and 90 given creat ranges - patient presents with increase in creat >0.3 mg/dL above baseline or creat increase >1.5x baseline presumed to have occurred within past 7 days PTA - creat 2.36 on admission - s/p NS bolus on admission; suspected prerenal  - improved with IVF - trend BMP   Chronic systolic CHF (congestive heart failure) (HCC) - compensated; no s/s exac - last echo: EF 45-50%, grade 1 DD on 09/02/21 - doubt needs cards clearance prior to surgery; METs easily 4+ and no concern at this time for decompensation - on jardiance, entresto, aldactone, asa, coreg, crestor at home - currently on coreg; remainder GDMT on hold in setting of AoCKD on admission; resume as able   Microcytic anemia - Iron studies checked on 01/14/2023 and in normal ranges -Baseline hemoglobin appears around 8 to 9 g/dL - Currently at baseline - Monitor especially postop with expected blood loss  History of stroke - No obvious residual deficits - On aspirin daily  Essential hypertension - continue amlodipine and coreg for now - resume Entresto and spironolactone as able  Type II diabetes mellitus with neurological manifestations (HCC) - A1c 8.1 % on 01/14/23 - continue SSI and CBG monitoring  -  Lantus on hold; can resume once CBGs recover further    Old records reviewed in assessment of this patient  Antimicrobials: Rocephin 01/21/2023 >> current Flagyl 01/21/2023 >> current Zyvox 01/21/2023 >> current  DVT prophylaxis:  heparin injection 5,000 Units Start: 01/21/23 2200   Code Status:   Code Status: Full Code  Mobility Assessment (Last 72 Hours)     Mobility  Assessment     Row Name 01/22/23 0953 01/22/23 0500 01/21/23 1925 01/21/23 1800     Does patient have an order for bedrest or is patient medically unstable No - Continue assessment No - Continue assessment No - Continue assessment No - Continue assessment    What is the highest level of mobility based on the progressive mobility assessment? Level 5 (Walks with assist in room/hall) - Balance while stepping forward/back and can walk in room with assist - Complete Level 5 (Walks with assist in room/hall) - Balance while stepping forward/back and can walk in room with assist - Complete Level 5 (Walks with assist in room/hall) - Balance while stepping forward/back and can walk in room with assist - Complete Level 5 (Walks with assist in room/hall) - Balance while stepping forward/back and can walk in room with assist - Complete             Barriers to discharge: none Disposition Plan:  Home Status is: Inpt  Objective: Blood pressure (!) 148/77, pulse 76, temperature 98.6 F (37 C), resp. rate 16, height 6\' 2"  (1.88 m), weight 122.1 kg, SpO2 99%.  Examination:  Physical Exam Constitutional:      General: He is not in acute distress.    Appearance: Normal appearance.  HENT:     Head: Normocephalic and atraumatic.     Mouth/Throat:     Mouth: Mucous membranes are moist.  Eyes:     Extraocular Movements: Extraocular movements intact.  Cardiovascular:     Rate and Rhythm: Normal rate and regular rhythm.  Pulmonary:     Effort: Pulmonary effort is normal. No respiratory distress.     Breath sounds: Normal breath sounds. No wheezing.  Abdominal:     General: Bowel sounds are normal. There is no distension.     Palpations: Abdomen is soft.     Tenderness: There is no abdominal tenderness.  Musculoskeletal:     Cervical back: Normal range of motion and neck supple.     Comments: Left foot wrapped in bandage; noted to have ~4cm round wound on dorsal surface of left foot with opaque almost  purulent drainage noted. No obvious erythema, edema, nor much dolor   Skin:    General: Skin is warm and dry.  Neurological:     General: No focal deficit present.     Mental Status: He is alert.  Psychiatric:        Mood and Affect: Mood normal.        Behavior: Behavior normal.      Consultants:  Orthopedic surgery  Procedures:  TBD  Data Reviewed: Results for orders placed or performed during the hospital encounter of 01/21/23 (from the past 24 hour(s))  Glucose, capillary     Status: Abnormal   Collection Time: 01/21/23  6:20 PM  Result Value Ref Range   Glucose-Capillary 167 (H) 70 - 99 mg/dL  Sedimentation rate     Status: Abnormal   Collection Time: 01/21/23  7:34 PM  Result Value Ref Range   Sed Rate 68 (H) 0 - 16 mm/hr  C-reactive protein  Status: Abnormal   Collection Time: 01/21/23  7:34 PM  Result Value Ref Range   CRP 11.7 (H) <1.0 mg/dL  Prealbumin     Status: Abnormal   Collection Time: 01/21/23  7:34 PM  Result Value Ref Range   Prealbumin 10 (L) 18 - 38 mg/dL  Glucose, capillary     Status: Abnormal   Collection Time: 01/21/23  9:24 PM  Result Value Ref Range   Glucose-Capillary 128 (H) 70 - 99 mg/dL  HIV Antibody (routine testing w rflx)     Status: None   Collection Time: 01/22/23  5:51 AM  Result Value Ref Range   HIV Screen 4th Generation wRfx Non Reactive Non Reactive  Basic metabolic panel     Status: Abnormal   Collection Time: 01/22/23  5:51 AM  Result Value Ref Range   Sodium 137 135 - 145 mmol/L   Potassium 4.1 3.5 - 5.1 mmol/L   Chloride 105 98 - 111 mmol/L   CO2 23 22 - 32 mmol/L   Glucose, Bld 101 (H) 70 - 99 mg/dL   BUN 30 (H) 6 - 20 mg/dL   Creatinine, Ser 1.61 (H) 0.61 - 1.24 mg/dL   Calcium 8.4 (L) 8.9 - 10.3 mg/dL   GFR, Estimated 44 (L) >60 mL/min   Anion gap 9 5 - 15  CBC     Status: Abnormal   Collection Time: 01/22/23  5:51 AM  Result Value Ref Range   WBC 7.2 4.0 - 10.5 K/uL   RBC 3.80 (L) 4.22 - 5.81 MIL/uL    Hemoglobin 8.4 (L) 13.0 - 17.0 g/dL   HCT 09.6 (L) 04.5 - 40.9 %   MCV 73.9 (L) 80.0 - 100.0 fL   MCH 22.1 (L) 26.0 - 34.0 pg   MCHC 29.9 (L) 30.0 - 36.0 g/dL   RDW 81.1 91.4 - 78.2 %   Platelets 244 150 - 400 K/uL   nRBC 0.0 0.0 - 0.2 %  Glucose, capillary     Status: Abnormal   Collection Time: 01/22/23  8:00 AM  Result Value Ref Range   Glucose-Capillary 101 (H) 70 - 99 mg/dL  Glucose, capillary     Status: Abnormal   Collection Time: 01/22/23 12:35 PM  Result Value Ref Range   Glucose-Capillary 161 (H) 70 - 99 mg/dL    I have reviewed pertinent nursing notes, vitals, labs, and images as necessary. I have ordered labwork to follow up on as indicated.  I have reviewed the last notes from staff over past 24 hours. I have discussed patient's care plan and test results with nursing staff, CM/SW, and other staff as appropriate.  Time spent: Greater than 50% of the 55 minute visit was spent in counseling/coordination of care for the patient as laid out in the A&P.   LOS: 1 day   Lewie Chamber, MD [facility] 01/22/2023, 2:16 PM

## 2023-01-22 NOTE — Discharge Instructions (Signed)

## 2023-01-22 NOTE — Hospital Course (Signed)
Prior om, 5th ray 5/24 New wound 2 weeks  Upset about situation, duda here (letr him know)

## 2023-01-22 NOTE — Assessment & Plan Note (Signed)
-   patient has history of CKD2 most likely. Baseline creat ~ 1.3. Suspect GFR between 60 and 90 given creat ranges - patient presents with increase in creat >0.3 mg/dL above baseline or creat increase >1.5x baseline presumed to have occurred within past 7 days PTA - creat 2.36 on admission - s/p NS bolus on admission; suspected prerenal  - improved with IVF - trend BMP

## 2023-01-22 NOTE — Hospital Course (Signed)
Steven Cochran is a 52 yo male with PMH CHF, DMII, HTN, CVA who presented with left foot pain and a non healing wound.  He was hospitalized in May 2024 for osteomyelitis of the left foot fifth toe and underwent fifth ray amputation on 09/26/2022. He has been recovering well since but had recently developed a wound on the dorsal surface of his foot which has not healed well. He underwent MRI of the foot on admission which shows osteomyelitis of the residual fifth metatarsal along with further evidence of concern for early acute osteomyelitis involving the midfoot.  He was started on Rocephin and Flagyl on admission.  Case was discussed with Dr. Lajoyce Corners and patient was recommended for transferring to [facility] for further evaluation.

## 2023-01-22 NOTE — Progress Notes (Signed)
Pharmacy Antibiotic Note  Steven Cochran is a 52 y.o. male admitted on 01/21/2023 with acute osteomyelitis of left foot. Pharmacy has been consulted for Daptomycin dosing.  Plan: Daptomycin 800mg  (~ 8mg /kg using adjusted body weight) IV q24h Monitor renal function, cultures, clinical course, further ID recommendations  CK at least weekly   Height: 6\' 2"  (188 cm) Weight: 122.1 kg (269 lb 1.6 oz) IBW/kg (Calculated) : 82.2  Temp (24hrs), Avg:99.2 F (37.3 C), Min:98.6 F (37 C), Max:99.9 F (37.7 C)  Recent Labs  Lab 01/21/23 1140 01/21/23 1404 01/22/23 0551  WBC 9.7  --  7.2  CREATININE 2.36*  --  1.82*  LATICACIDVEN  --  0.5  --     Estimated Creatinine Clearance: 66.7 mL/min (A) (by C-G formula based on SCr of 1.82 mg/dL (H)).    Allergies  Allergen Reactions   Metformin And Related Diarrhea    Antimicrobials this admission: 9/16 Ceftriaxone >> 9/16 Metronidazole >> 9/16 Linezolid >> 9/17 9/17 Daptomycin >>  Dose adjustments this admission: --  Microbiology results: 9/16 BCx: ngtd   Thank you for allowing pharmacy to be a part of this patient's care.   Greer Pickerel, PharmD, BCPS Clinical Pharmacist 01/22/2023 5:53 PM

## 2023-01-22 NOTE — Progress Notes (Signed)
Initial Nutrition Assessment  DOCUMENTATION CODES:   Obesity unspecified  INTERVENTION:   -Ensure MAX Protein po BID, each supplement provides 150 kcal and 30 grams of protein   -Multivitamin with minerals daily -500 mg Vitamin C BID -220 mg Zinc sulfate daily x 14 days  -1 packet Juven BID, each packet provides 95 calories, 2.5 grams of protein (collagen), and 9.8 grams of carbohydrate (3 grams sugar); also contains 7 grams of L-arginine and L-glutamine, 300 mg vitamin C, 15 mg vitamin E, 1.2 mcg vitamin B-12, 9.5 mg zinc, 200 mg calcium, and 1.5 g  Calcium Beta-hydroxy-Beta-methylbutyrate to support wound healing  -Placed "Carbohydrate Counting" handout in AVS -reviewed diet principles with patient at bedside  NUTRITION DIAGNOSIS:   Increased nutrient needs related to wound healing as evidenced by estimated needs.  GOAL:   Patient will meet greater than or equal to 90% of their needs  MONITOR:   PO intake, Supplement acceptance, Labs, Weight trends, I & O's, Skin  REASON FOR ASSESSMENT:   Consult Wound healing  ASSESSMENT:   52 y.o. male with medical history significant for hypertension, insulin-dependent diabetes mellitus, chronic HFmrEF, and history of CVA who presents with left foot swelling.     Patient underwent left foot fifth ray amputation in May 2024 for osteomyelitis.  Patient in room, asking to speak with MD. Agreeable to speaking with RD today. States he has not been eating consistently at home. Has had trouble keeping his blood sugars controlled at home. States no matter what he eats his numbers are high. However, has noticed they are in the 200s and 300s versus when he was first diagnosed his blood sugar was way higher. Pt feels frustrated by numbers. HgbA1c has been trending down. Pt admits to not eating some days and eats every other day mostly. RD encouraged more consistent intakes. Drink protein shakes to maintain nutrition and to support wound healing of  left foot. Pt does not take any vitamins at home. Agreeable to receiving vitamins here to support wound healing.  Of note, pt was started on Ozempic in March 2024.   Per weight records, no significant changes in weight.   Medications reviewed.  Labs reviewed: CBGs: 101-167   NUTRITION - FOCUSED PHYSICAL EXAM:  Flowsheet Row Most Recent Value  Orbital Region No depletion  Upper Arm Region No depletion  Thoracic and Lumbar Region No depletion  Buccal Region No depletion  Temple Region No depletion  Clavicle Bone Region No depletion  Clavicle and Acromion Bone Region No depletion  Scapular Bone Region No depletion  Dorsal Hand No depletion  Patellar Region No depletion  Anterior Thigh Region No depletion  Posterior Calf Region Mild depletion  [left leg]  Edema (RD Assessment) Mild  [LLE]  Hair Reviewed  Eyes Reviewed  Mouth Reviewed  Skin Reviewed  Nails Reviewed       Diet Order:   Diet Order             Diet regular Room service appropriate? Yes; Fluid consistency: Thin  Diet effective now                   EDUCATION NEEDS:   Education needs have been addressed  Skin:  Skin Assessment: Reviewed RN Assessment  Last BM:  9/17  Height:   Ht Readings from Last 1 Encounters:  01/21/23 6\' 2"  (1.88 m)    Weight:   Wt Readings from Last 1 Encounters:  01/22/23 122.1 kg    BMI:  Body mass index is 34.55 kg/m.  Estimated Nutritional Needs:   Kcal:  2150-2350  Protein:  120-130g  Fluid:  2.1L/day   Tilda Franco, MS, RD, LDN Inpatient Clinical Dietitian Contact information available via Amion

## 2023-01-22 NOTE — Consult Note (Signed)
Date of Admission:  01/21/2023          Reason for Consult: Diabetic foot ulcer with osteomyelitis    Referring Provider: Lewie Chamber, MD   Assessment:  Recurrent osteomyelitis after transmetatarsal amputation of fifth toe with edema found in the navicular medial cuneiform and intermediate cuneiform bones c/w acute osteomyelits as well as marrow edema in the cuboid and bases of the fourth metatarsals with the latter favored to represent reactive osteitis, tenosynovitis the tibialis anterior extensor houses Diabetes mellitus CKD Hx of CVA  Plan:  Daptomycin and ceftriaxone and metronidazole If he does go to [facility] for further surgical evaluation we would very much support surgical intervention.  As mentioned the patient when I spoke with him and her 2 pharmacist it was clear that he was not wanting any surgical intervention at this point in time He will also contemplate whether he want to go home with IV antibiotics versus oral antibiotics I have been very explicit and telling him that he will not be able to cure this infection without surgical intervention with antibiotics and the antibiotics alone are not going to cure the infection  Principal Problem:   Acute osteomyelitis of left foot (HCC) Active Problems:   Type II diabetes mellitus with neurological manifestations (HCC)   Chronic systolic CHF (congestive heart failure) (HCC)   Microcytic anemia   Essential hypertension   Acute renal failure superimposed on stage 2 chronic kidney disease (HCC)   History of stroke   Scheduled Meds:  amLODipine  5 mg Oral Daily   ascorbic acid  500 mg Oral BID   aspirin EC  81 mg Oral Daily   carvedilol  25 mg Oral BID WC   dorzolamide-timolol  1 drop Both Eyes BID   heparin  5,000 Units Subcutaneous Q8H   insulin aspart  0-5 Units Subcutaneous QHS   insulin aspart  0-6 Units Subcutaneous TID WC   multivitamin with minerals  1 tablet Oral Daily   nutrition supplement (JUVEN)   1 packet Oral BID BM   Ensure Max Protein  11 oz Oral BID   rosuvastatin  40 mg Oral Daily   zinc sulfate  220 mg Oral Daily   Continuous Infusions:  cefTRIAXone (ROCEPHIN)  IV 2 g (01/22/23 1556)   And   metronidazole 500 mg (01/22/23 0240)   linezolid (ZYVOX) IV 600 mg (01/22/23 0948)   PRN Meds:.acetaminophen **OR** acetaminophen, oxyCODONE, senna-docusate  HPI: Steven Cochran is a 52 y.o. male with past medical history significant for diabetes mellitus hypertension prior stroke who was hospitalized in May 2024 with osteomyelitis of the left foot with fifth toe involvement status post fifth ray amputation on Sep 26, 2022.  He had been doing relatively well but then developed a wound on the dorsal foot surface of his foot which is not healing.  He had worsening pain and swelling and ultimately came to the ER for evaluation plain films were unrevealing but MRI of the foot shows   IMPRESSION: 1. Postsurgical changes from transmetatarsal amputation of the fifth ray at the level of the proximal fifth metatarsal diaphysis. Osteomyelitis of the residual fifth metatarsal. 2. Open wound over the dorsum of the midfoot with patchy bone marrow edema throughout the bones of the midfoot, particularly affecting the navicular, medial cuneiform, and intermediate cuneiform bones. This is favored to represent early acute osteomyelitis. 3. Additionally, milder bone marrow edema is present within the cuboid and the bases of the second  through fourth metatarsals. Findings are favored to represent reactive osteitis. 4. Moderate tenosynovitis of the tibialis anterior and extensor hallucis longus tendons at the dorsum of the ankle and midfoot. 5. Diffuse edema-like signal of the foot musculature which may represent changes related to denervation and/or myositis.  He has been on ceftriaxone linezolid and metronidazole.  Dr. Lajoyce Corners was spoken to who recommended transfer to [facility] for further  evaluation.  Patient very much does not want a surgical intervention at this point in time.  Apparently when he had his last surgery it was very difficult for him to be back at home in his apartment which is on the third floor.  He found himself largely confined to his room for the majority of the time he spent recovering and he does not want to have to go through this again.  He is also worried about losing more of his foot with further surgeries.  We are consulted to see the patient with assistance of management and workup.  I have told him that the bone infections that we are seeing in his foot are NOT curable with antibiotics alone.  I have told him that we may be able to buy him time with antibiotics whether we do IV antibiotics for some period followed by oral antibiotics.  Again my preference in terms of achieving cure would be surgical intervention with Dr. Lajoyce Corners but the patient was very much against this when I talked to him this afternoon.  Now we will place him on daptomycin ceftriaxone and metronidazole.  He will contemplate potential surgery of though it began when we saw him it did not seem like there was any room in his mind for surgery at this point in time.  He is also unsure about going home with IV antibiotics which we do not certainly have to do.  I have personally spent 84 minutes involved in face-to-face and non-face-to-face activities for this patient on the day of the visit. Professional time spent includes the following activities: Preparing to see the patient (review of tests), Obtaining and/or reviewing separately obtained history (admission/discharge record), Performing a medically appropriate examination and/or evaluation , Ordering medications/tests/procedures, referring and communicating with other health care professionals, Documenting clinical information in the EMR, Independently interpreting results (not separately reported), Communicating results to the  patient/family/caregiver, Counseling and educating the patient/family/caregiver and Care coordination (not separately reported).     Review of Systems: Review of Systems  Constitutional:  Negative for chills, fever, malaise/fatigue and weight loss.  HENT:  Negative for congestion and sore throat.   Eyes:  Negative for blurred vision and photophobia.  Respiratory:  Negative for cough, shortness of breath and wheezing.   Cardiovascular:  Negative for chest pain, palpitations and leg swelling.  Gastrointestinal:  Negative for abdominal pain, blood in stool, constipation, diarrhea, heartburn, melena, nausea and vomiting.  Genitourinary:  Negative for dysuria, flank pain and hematuria.  Musculoskeletal:  Positive for joint pain and myalgias. Negative for back pain and falls.  Skin:  Negative for itching and rash.  Neurological:  Negative for dizziness, focal weakness, loss of consciousness, weakness and headaches.  Endo/Heme/Allergies:  Does not bruise/bleed easily.  Psychiatric/Behavioral:  Negative for depression and suicidal ideas. The patient does not have insomnia.     Past Medical History:  Diagnosis Date   Cataract    CHF (congestive heart failure) (HCC)    Diabetes mellitus    Hypertension    Stroke [facility])     Social History  Tobacco Use   Smoking status: Never   Smokeless tobacco: Never  Vaping Use   Vaping status: Never Used  Substance Use Topics   Alcohol use: No   Drug use: No    Family History  Problem Relation Age of Onset   Colon cancer Neg Hx    Esophageal cancer Neg Hx    Stomach cancer Neg Hx    Rectal cancer Neg Hx    Allergies  Allergen Reactions   Metformin And Related Diarrhea    OBJECTIVE: Blood pressure (!) 148/77, pulse 76, temperature 98.6 F (37 C), resp. rate 16, height 6\' 2"  (1.88 m), weight 122.1 kg, SpO2 99%.  Physical Exam Constitutional:      Appearance: He is well-developed.  HENT:     Head: Normocephalic and atraumatic.   Eyes:     Conjunctiva/sclera: Conjunctivae normal.  Cardiovascular:     Rate and Rhythm: Normal rate and regular rhythm.  Pulmonary:     Effort: Pulmonary effort is normal. No respiratory distress.     Breath sounds: No wheezing.  Abdominal:     General: There is no distension.     Palpations: Abdomen is soft.  Musculoskeletal:     Cervical back: Normal range of motion and neck supple.  Skin:    General: Skin is warm and dry.     Coloration: Skin is not pale.     Findings: No erythema or rash.  Neurological:     General: No focal deficit present.     Mental Status: He is alert and oriented to person, place, and time.  Psychiatric:        Mood and Affect: Mood normal.        Behavior: Behavior normal.        Thought Content: Thought content normal.        Judgment: Judgment normal.    Left foot bandaged  Lab Results Lab Results  Component Value Date   WBC 7.2 01/22/2023   HGB 8.4 (L) 01/22/2023   HCT 28.1 (L) 01/22/2023   MCV 73.9 (L) 01/22/2023   PLT 244 01/22/2023    Lab Results  Component Value Date   CREATININE 1.82 (H) 01/22/2023   BUN 30 (H) 01/22/2023   NA 137 01/22/2023   K 4.1 01/22/2023   CL 105 01/22/2023   CO2 23 01/22/2023    Lab Results  Component Value Date   ALT 26 01/21/2023   AST 23 01/21/2023   ALKPHOS 83 01/21/2023   BILITOT 0.7 01/21/2023     Microbiology: Recent Results (from the past 240 hour(s))  Blood Cultures x 2 sites     Status: None (Preliminary result)   Collection Time: 01/21/23  1:54 PM   Specimen: BLOOD  Result Value Ref Range Status   Specimen Description   Final    BLOOD RIGHT ANTECUBITAL Performed at [facility], [address], [place], [place] [address]    Special Requests   Final    BOTTLES DRAWN AEROBIC AND ANAEROBIC Blood Culture adequate volume Performed at [facility], [address], [place], [place] [address]    Culture   Final    NO GROWTH < 12 HOURS Performed  at [facility], [address] [place], [place] [address]    Report Status PENDING  Incomplete  Blood Cultures x 2 sites     Status: None (Preliminary result)   Collection Time: 01/21/23  2:02 PM   Specimen: BLOOD RIGHT FOREARM  Result Value Ref Range Status   Specimen Description   Final    BLOOD RIGHT FOREARM Performed at [facility], [address] [place], [place] [address]    Special Requests   Final    BOTTLES DRAWN AEROBIC AND ANAEROBIC Blood Culture adequate volume Performed at [facility], [address] [place] [place], [place] [address]    Culture   Final    NO GROWTH < 12 HOURS Performed at [facility], [address], [place], [place] [address]    Report Status PENDING  Incomplete    Acey Lav, MD [facility] [phone] pager  01/22/2023, 4:24 PM

## 2023-01-22 NOTE — Assessment & Plan Note (Addendum)
-   A1c 8.1 % on 01/14/23 - continue SSI and CBG monitoring  - Lantus on hold; can resume once CBGs recover further

## 2023-01-23 ENCOUNTER — Other Ambulatory Visit (HOSPITAL_COMMUNITY): Payer: Self-pay

## 2023-01-23 DIAGNOSIS — D649 Anemia, unspecified: Secondary | ICD-10-CM

## 2023-01-23 DIAGNOSIS — N1831 Chronic kidney disease, stage 3a: Secondary | ICD-10-CM

## 2023-01-23 DIAGNOSIS — M86672 Other chronic osteomyelitis, left ankle and foot: Secondary | ICD-10-CM

## 2023-01-23 DIAGNOSIS — M86172 Other acute osteomyelitis, left ankle and foot: Secondary | ICD-10-CM | POA: Diagnosis not present

## 2023-01-23 LAB — BASIC METABOLIC PANEL WITH GFR
Anion gap: 10 (ref 5–15)
BUN: 30 mg/dL — ABNORMAL HIGH (ref 6–20)
CO2: 19 mmol/L — ABNORMAL LOW (ref 22–32)
Calcium: 8.6 mg/dL — ABNORMAL LOW (ref 8.9–10.3)
Chloride: 105 mmol/L (ref 98–111)
Creatinine, Ser: 1.66 mg/dL — ABNORMAL HIGH (ref 0.61–1.24)
GFR, Estimated: 50 mL/min — ABNORMAL LOW (ref >60)
Glucose, Bld: 183 mg/dL — ABNORMAL HIGH (ref 70–99)
Potassium: 4.3 mmol/L (ref 3.5–5.1)
Sodium: 134 mmol/L — ABNORMAL LOW (ref 135–145)

## 2023-01-23 LAB — GLUCOSE, CAPILLARY
Glucose-Capillary: 156 mg/dL — ABNORMAL HIGH (ref 70–99)
Glucose-Capillary: 181 mg/dL — ABNORMAL HIGH (ref 70–99)

## 2023-01-23 LAB — CK: Total CK: 139 U/L (ref 49–397)

## 2023-01-23 LAB — MAGNESIUM: Magnesium: 2.3 mg/dL (ref 1.7–2.4)

## 2023-01-23 MED ORDER — AMOXICILLIN-POT CLAVULANATE 875-125 MG PO TABS
1.0000 | ORAL_TABLET | Freq: Two times a day (BID) | ORAL | 0 refills | Status: AC
  Filled 2023-01-23: qty 84, 42d supply, fill #0

## 2023-01-23 MED ORDER — SODIUM CHLORIDE 0.9 % IV SOLN: 2.0000 g | INTRAVENOUS | Status: DC

## 2023-01-23 MED ORDER — AMOXICILLIN-POT CLAVULANATE 875-125 MG PO TABS
1.0000 | ORAL_TABLET | Freq: Two times a day (BID) | ORAL | Status: DC
  Administered 2023-01-23: 1 via ORAL
  Filled 2023-01-23: qty 1

## 2023-01-23 MED ORDER — DOXYCYCLINE HYCLATE 100 MG PO TABS
100.0000 mg | ORAL_TABLET | Freq: Two times a day (BID) | ORAL | 0 refills | Status: AC
Start: 2023-01-23 — End: 2023-03-06
  Filled 2023-01-23: qty 84, 42d supply, fill #0

## 2023-01-23 MED ORDER — ADULT MULTIVITAMIN W/MINERALS CH
1.0000 | ORAL_TABLET | Freq: Every day | ORAL | 0 refills | Status: AC
Start: 2023-01-24 — End: ?
  Filled 2023-01-23: qty 130, 130d supply, fill #0

## 2023-01-23 MED ORDER — METRONIDAZOLE 500 MG/100ML IV SOLN: 500.0000 mg | Freq: Two times a day (BID) | INTRAVENOUS | Status: DC

## 2023-01-23 MED ORDER — DOXYCYCLINE HYCLATE 100 MG PO TABS
100.0000 mg | ORAL_TABLET | Freq: Two times a day (BID) | ORAL | Status: DC
  Administered 2023-01-23: 100 mg via ORAL
  Filled 2023-01-23: qty 1

## 2023-01-23 NOTE — TOC Transition Note (Signed)
Transition of Care [facility]) - CM/SW Discharge Note   Patient Details  Name: Steven Cochran MRN: [id number] Date of Birth: 12-19-1970  Transition of Care [facility]) CM/SW Contact:  Lawerance Sabal, RN Phone Number: 01/23/2023, 2:56 PM   Clinical Narrative:     [name] w patient over the phone. Discussed supports after DC, and he is agreeable to [facility] RN. I have arranged this through [facility]. Requested HH order from MD.  Meds will be filled through [facility] pharmacy.    Final next level of care: Home w Home Health Services Barriers to Discharge: No Barriers Identified   Patient Goals and CMS Choice CMS Medicare.gov Compare Post Acute Care list provided to:: Patient Choice offered to / list presented to : Patient  Discharge Placement                         Discharge Plan and Services Additional resources added to the After Visit Summary for                  DME Arranged: N/A         HH Arranged: RN HH Agency: [facility] Date [facility] Agency Contacted: 01/23/23 Time HH Agency Contacted: 1456 Representative spoke with at [facility] Agency: Kandee Keen  Social Determinants of Health (SDOH) Interventions SDOH Screenings   Food Insecurity: No Food Insecurity (01/22/2023)  Housing: Low Risk  (01/22/2023)  Transportation Needs: No Transportation Needs (01/22/2023)  Utilities: Not At Risk (01/22/2023)  Alcohol Screen: Low Risk  (07/12/2022)  Depression (PHQ2-9): Low Risk  (10/17/2022)  Financial Resource Strain: Low Risk  (07/12/2022)  Physical Activity: Sufficiently Active (07/12/2022)  Social Connections: Socially Isolated (01/14/2023)  Stress: No Stress Concern Present (07/12/2022)  Tobacco Use: Low Risk  (01/21/2023)     Readmission Risk Interventions     No data to display

## 2023-01-23 NOTE — Progress Notes (Signed)
Patient verbalized understanding of dc instructions. Left foot dressing reinforced by RN Micael Hampshire. Site clean dry and intact. All belongings and dc instructions given to patient. Will transfer patient dc lounge.

## 2023-01-23 NOTE — Discharge Summary (Signed)
Name: Steven Cochran MRN: [id number] DOB: Jul 29, 1970 52 y.o. PCP: Gwenevere Abbot, MD  Date of Admission: 01/21/2023 11:22 AM Date of Discharge: 01/23/2023  Attending Physician: Dr. Antony Contras  DISCHARGE DIAGNOSIS:  Primary Problem: Acute osteomyelitis of left foot [facility])   Hospital Problems: Principal Problem:   Acute osteomyelitis of left foot (HCC) Active Problems:   Type II diabetes mellitus with neurological manifestations (HCC)   Chronic systolic CHF (congestive heart failure) (HCC)   Microcytic anemia   Essential hypertension   Acute renal failure superimposed on stage 2 chronic kidney disease (HCC)   History of stroke    DISCHARGE MEDICATIONS:   Allergies as of 01/23/2023       Reactions   Metformin And Related Diarrhea        Medication List     STOP taking these medications    Entresto 97-103 MG Generic drug: sacubitril-valsartan   spironolactone 25 MG tablet Commonly known as: Aldactone       TAKE these medications    amLODipine 5 MG tablet Commonly known as: Norvasc Take 1 tablet (5 mg total) by mouth daily.   amoxicillin-clavulanate 875-125 MG tablet Commonly known as: AUGMENTIN Take 1 tablet by mouth 2 (two) times daily.   aspirin EC 81 MG tablet Take 1 tablet (81 mg total) by mouth daily. Swallow whole.   carvedilol 25 MG tablet Commonly known as: COREG Take 1 tablet (25 mg total) by mouth 2 (two) times daily.   CertaVite/Antioxidants Tabs Take 1 tablet by mouth daily. Start taking on: January 24, 2023   dorzolamide-timolol 2-0.5 % ophthalmic solution Commonly known as: COSOPT Place 2 drops into the right eye 2 (two) times daily.   doxycycline 100 MG tablet Commonly known as: VIBRA-TABS Take 1 tablet (100 mg total) by mouth 2 (two) times daily.   empagliflozin 25 MG Tabs tablet Commonly known as: JARDIANCE Take 1 tablet (25 mg total) by mouth daily.   ezetimibe 10 MG tablet Commonly known as: Zetia Take 1 tablet (10 mg total)  by mouth daily.   FREESTYLE LITE test strip Generic drug: glucose blood Monitor blood sugar at home   insulin glargine 100 UNIT/ML Solostar Pen Commonly known as: LANTUS Inject 15 Units into the skin daily.   rosuvastatin 40 MG tablet Commonly known as: CRESTOR Take 1 tablet by mouth once daily               Discharge Care Instructions  (From admission, onward)           Start     Ordered   01/23/23 0000  Discharge wound care:       Comments: Cleanse left foot with saline, pat dry Cover with single layer of xeroform gauze, top with dry dressing, kerlix Change daily while inpatient.   01/23/23 1456            DISPOSITION AND FOLLOW-UP:  Steven Cochran was discharged from [facility] in Stable condition. At the hospital follow up visit please address:  Osteomyelitis Ensure patient has or will be following-up with orthopedics and ensure Abx compliance.  AKI CKD 3a -Repeat BMP -Consider restarting Entresto and Aldactone   Chronic Anemia -Repeat Hgb  Follow-up Appointments:  Follow-up Information     Nadara Mustard, MD Follow up in 4 week(s).   Specialty: Orthopedic Surgery Contact information: [address] [place] [address] [phone]         Care, [facility] Follow up.   Specialty: Home Health  Services Why: for home health nursing Contact information: [address] [place] [address] [phone]                 HOSPITAL COURSE:  Patient Summary: Steven Cochran is a 52 y.o. male with medical history significant for hypertension, CKD 3A, insulin-dependent diabetes mellitus, chronic HFmrEF, chronic anemia, and history of CVA who presented to [facility] ED with left foot swelling. Subsequent work-up revealed osteomyelitis and patient was started on IV: Daptomycin, Flagyl, and Rocephin. Patient transferred to [facility] for further evaluation. Ortho consulted and treatment plan was  determined to be BKA. Patient expressed mobility concerns given the fact that he's in the process of moving to a 1st floor apartment from the 3rd floor and wished to delay amputation. Patient informed several times that only curative treatment is amputation, but patient wished to proceed with delayed surgery. ID recommended empiric doxy/augmentin for 2 months but advised earlier surgery before 2 months. Patient understanding of plan and will follow-up with Dr. Lajoyce Corners with Orthopedics.   During admission patient noted to of AKI on CKD 3a, patient received fluids and home Spironolactone, Jardiance, and Entresto were held. Creatinine down-trended and patient was instructed to hold off on taking Spironolactone and Entresto until PCP follow-up.  DISCHARGE INSTRUCTIONS:   Discharge Instructions     Diet - low sodium heart healthy   Complete by: As directed    Discharge instructions   Complete by: As directed    You were hospitalized for an infection on your foot that spread to the bone. The treatment for this was unfortunately determined to be amputation, but given your at-home needs, this will be delayed. Please take both antibiotics as prescribed and ensure you follow-up with Dr. Lajoyce Corners (Orthopedic surgeon) as well as your PCP.  Also please wait until you follow-up with your primary doctor or your cardiologist before re-starting back on your Entresto and Spironolactone to allow your kidney function to further improve.   Discharge wound care:   Complete by: As directed    Cleanse left foot with saline, pat dry Cover with single layer of xeroform gauze, top with dry dressing, kerlix Change daily while inpatient.   Increase activity slowly   Complete by: As directed        SUBJECTIVE:  Patient without pain and is understanding of discharge plan. Discharge Vitals:   BP 128/84   Pulse 78   Temp 98.2 F (36.8 C) (Oral)   Resp 18   Ht 6\' 2"  (1.88 m)   Wt 122.1 kg   SpO2 100%   BMI 34.55 kg/m    OBJECTIVE:  Physical Exam: Constitutional: sitting in bed, in no acute distress Cardiovascular: regular rate and rhythm, no m/r/g Pulmonary/Chest: normal work of breathing on room air, lungs clear to auscultation bilaterally MSK: left foot with bandaging without signs of surrounding erythema, left 5th digit amputated.   Pertinent Labs, Studies, and Procedures:     Latest Ref Rng & Units 01/23/2023   10:01 AM 01/22/2023    5:51 AM 01/21/2023   11:40 AM  CBC  WBC 4.0 - 10.5 K/uL 7.2  7.2  9.7   Hemoglobin 13.0 - 17.0 g/dL 9.4  8.4  9.0   Hematocrit 39.0 - 52.0 % 30.8  28.1  29.1   Platelets 150 - 400 K/uL 262  244  239        Latest Ref Rng & Units 01/23/2023   10:01 AM 01/22/2023    5:51  AM 01/21/2023   11:40 AM  CMP  Glucose 70 - 99 mg/dL 409  811  914   BUN 6 - 20 mg/dL 30  30  39   Creatinine 0.61 - 1.24 mg/dL 7.82  9.56  2.13   Sodium 135 - 145 mmol/L 134  137  134   Potassium 3.5 - 5.1 mmol/L 4.3  4.1  4.4   Chloride 98 - 111 mmol/L 105  105  102   CO2 22 - 32 mmol/L 19  23  21    Calcium 8.9 - 10.3 mg/dL 8.6  8.4  8.9   Total Protein 6.5 - 8.1 g/dL   7.7   Total Bilirubin 0.3 - 1.2 mg/dL   0.7   Alkaline Phos 38 - 126 U/L   83   AST 15 - 41 U/L   23   ALT 0 - 44 U/L   26     MR FOOT LEFT W WO CONTRAST  Result Date: 01/21/2023 CLINICAL DATA:  Open wound of left foot with redness and swelling EXAM: MRI OF THE LEFT FOOT WITHOUT AND WITH CONTRAST TECHNIQUE: Multiplanar, multisequence MR imaging of the left hindfoot was performed both before and after administration of intravenous contrast. CONTRAST:  10mL GADAVIST GADOBUTROL 1 MMOL/ML IV SOLN COMPARISON:  X-ray 01/21/2023 FINDINGS: Bones/Joint/Cartilage Postsurgical changes from transmetatarsal amputation of the fifth ray at the level of the proximal fifth metatarsal diaphysis. There is bone marrow edema with confluent low T1 marrow signal throughout the residual fifth metatarsal. Patchy bone marrow edema throughout the  bones of the midfoot, particularly affecting the navicular, medial cuneiform, and intermediate cuneiform bones. There is intermediate T1 marrow signal within these bones. Additionally, milder bone marrow edema with preserved T1 marrow signal is present within the cuboid and the bases of the second through fourth metatarsals. No acute fracture or dislocation.  No sizable joint effusion. Ligaments Intact Lisfranc ligament. Intact medial and lateral ankle ligaments. Thickening of the medial band of the plantar fascia. Muscles and Tendons Moderate tenosynovitis of the tibialis anterior and extensor hallucis longus tendons at the dorsum of the ankle and midfoot. Diffuse edema-like signal of the foot musculature which may represent changes related to denervation and/or myositis. Soft tissues Large open wound over the dorsum of the midfoot. Diffuse soft tissue edema. No drainable fluid collections. IMPRESSION: 1. Postsurgical changes from transmetatarsal amputation of the fifth ray at the level of the proximal fifth metatarsal diaphysis. Osteomyelitis of the residual fifth metatarsal. 2. Open wound over the dorsum of the midfoot with patchy bone marrow edema throughout the bones of the midfoot, particularly affecting the navicular, medial cuneiform, and intermediate cuneiform bones. This is favored to represent early acute osteomyelitis. 3. Additionally, milder bone marrow edema is present within the cuboid and the bases of the second through fourth metatarsals. Findings are favored to represent reactive osteitis. 4. Moderate tenosynovitis of the tibialis anterior and extensor hallucis longus tendons at the dorsum of the ankle and midfoot. 5. Diffuse edema-like signal of the foot musculature which may represent changes related to denervation and/or myositis. Electronically Signed   By: Duanne Guess D.O.   On: 01/21/2023 18:38   US RENAL  Result Date: 01/21/2023 CLINICAL DATA:  Acute renal failure EXAM: RENAL /  URINARY TRACT ULTRASOUND COMPLETE COMPARISON:  None Available. FINDINGS: Right Kidney: Renal measurements: 12.1 x 5.4 x 5.8 cm = volume: 197.2 mL. Echogenicity within normal limits. No mass or hydronephrosis visualized. Left Kidney: Renal measurements: 11.6 x 5.4 x  6.4 cm = volume: 208.7 mL. Echogenicity within normal limits. No mass or hydronephrosis visualized. Bladder: Ureteral jets are seen. Slight urinary bladder wall thickening and trabeculation. Other: None. IMPRESSION: No collecting system dilatation. Electronically Signed   By: Karen Kays M.D.   On: 01/21/2023 16:41   DG Foot Complete Left  Result Date: 01/21/2023 CLINICAL DATA:  Concern for osteomyelitis EXAM: LEFT FOOT - COMPLETE 3+ VIEW COMPARISON:  Ankle film 12/06/2022 FINDINGS: Osteotomy site in the proximal fifth metatarsal. Staple within the soft tissue of the great toe along the lateral border at the level of the interphalangeal joint. staple present on comparison radiograph. No osseous erosion to suggest acute osteomyelitis. IMPRESSION: 1. Foreign body (staple) within the soft tissues of the great toe. 2. No evidence of acute osteomyelitis. Electronically Signed   By: Genevive Bi M.D.   On: 01/21/2023 14:56     Signed: Carmina Miller, DO  Internal Medicine Resident, PGY-1 [facility]  Pager: [phone] 5:03 PM, 01/23/2023

## 2023-01-23 NOTE — Progress Notes (Signed)
[facility]  Date of Admission:  01/21/2023     Total days of antibiotics 3         ASSESSMENT:  Steven Cochran has necrotic ulceration of the dorsum of his left foot with osteomyelitis of the midfoot that will ultimately require surgical intervention as antibiotics will not be able to resolve this infection. He is aware of this and requesting oral antibiotics so that he may get his affairs in order in preparation for surgery. Discussed having a low threshold for seeking care should symptoms worsen or fevers develop despite antibiotics. Will change daptomycin and ceftriaxone to doxycyline 100 mg PO bid and Augmentin 875/125 mg PO bid with plan for 6 weeks. Ok for discharge from ID standpoint and recommend follow up with Dr. Lajoyce Corners for surgery as able or sooner if needed. Remaining medical and supportive care per Internal Medicine Team.   PLAN:  Change antibiotics to doxycycline and Augmentin.  Continue wound care per Orthopedics.  Ok for discharge from ID standpoint.  Follow up with Dr. Lajoyce Corners to schedule surgery.  Remaining medical and supportive care per Internal Medicine Team.   Principal Problem:   Acute osteomyelitis of left foot (HCC) Active Problems:   Type II diabetes mellitus with neurological manifestations (HCC)   Chronic systolic CHF (congestive heart failure) (HCC)   Microcytic anemia   Essential hypertension   Acute renal failure superimposed on stage 2 chronic kidney disease (HCC)   History of stroke    amLODipine  5 mg Oral Daily   ascorbic acid  500 mg Oral BID   aspirin EC  81 mg Oral Daily   carvedilol  25 mg Oral BID WC   dorzolamide-timolol  1 drop Both Eyes BID   heparin  5,000 Units Subcutaneous Q8H   insulin aspart  0-5 Units Subcutaneous QHS   insulin aspart  0-6 Units Subcutaneous TID WC   multivitamin with minerals  1 tablet Oral Daily   nutrition supplement (JUVEN)  1 packet Oral BID BM   Ensure Max Protein  11 oz Oral BID    rosuvastatin  40 mg Oral Daily   zinc sulfate  220 mg Oral Daily    SUBJECTIVE:  Afebrile overnight with no acute events. Needing to get affairs in order prior to next surgery and requesting oral antibiotics.   Allergies  Allergen Reactions   Metformin And Related Diarrhea     Review of Systems: Review of Systems  Constitutional:  Negative for chills, fever and weight loss.  Respiratory:  Negative for cough, shortness of breath and wheezing.   Cardiovascular:  Negative for chest pain and leg swelling.  Gastrointestinal:  Negative for abdominal pain, constipation, diarrhea, nausea and vomiting.  Skin:  Negative for rash.      OBJECTIVE: Vitals:   01/22/23 1233 01/22/23 2028 01/23/23 0600 01/23/23 0821  BP: (!) 148/77 127/71 (!) 157/86 128/84  Pulse: 76 76 77 78  Resp: 16  18   Temp: 98.6 F (37 C)  97.9 F (36.6 C) 98.2 F (36.8 C)  TempSrc:   Oral Oral  SpO2: 99% 100% 97% 100%  Weight:      Height:       Body mass index is 34.55 kg/m.  Physical Exam Constitutional:      General: He is not in acute distress.    Appearance: He is well-developed.  Cardiovascular:     Rate and Rhythm: Normal rate and regular rhythm.     Heart  sounds: Normal heart sounds.  Pulmonary:     Effort: Pulmonary effort is normal.     Breath sounds: Normal breath sounds.  Skin:    General: Skin is warm and dry.  Neurological:     Mental Status: He is alert and oriented to person, place, and time.  Psychiatric:        Behavior: Behavior normal.        Thought Content: Thought content normal.        Judgment: Judgment normal.     Lab Results Lab Results  Component Value Date   WBC 7.2 01/23/2023   HGB 9.4 (L) 01/23/2023   HCT 30.8 (L) 01/23/2023   MCV 73.7 (L) 01/23/2023   PLT 262 01/23/2023    Lab Results  Component Value Date   CREATININE 1.66 (H) 01/23/2023   BUN 30 (H) 01/23/2023   NA 134 (L) 01/23/2023   K 4.3 01/23/2023   CL 105 01/23/2023   CO2 19 (L) 01/23/2023     Lab Results  Component Value Date   ALT 26 01/21/2023   AST 23 01/21/2023   ALKPHOS 83 01/21/2023   BILITOT 0.7 01/21/2023     Microbiology: Recent Results (from the past 240 hour(s))  Blood Cultures x 2 sites     Status: None (Preliminary result)   Collection Time: 01/21/23  1:54 PM   Specimen: BLOOD  Result Value Ref Range Status   Specimen Description   Final    BLOOD RIGHT ANTECUBITAL Performed at [facility], [address], [place], [place] [address]    Special Requests   Final    BOTTLES DRAWN AEROBIC AND ANAEROBIC Blood Culture adequate volume Performed at [facility], [address], [place], [place] [address]    Culture   Final    NO GROWTH 2 DAYS Performed at [facility], [address] [place], [place] [address]    Report Status PENDING  Incomplete  Blood Cultures x 2 sites     Status: None (Preliminary result)   Collection Time: 01/21/23  2:02 PM   Specimen: BLOOD RIGHT FOREARM  Result Value Ref Range Status   Specimen Description   Final    BLOOD RIGHT FOREARM Performed at [facility], [address], [place], [place] [address]    Special Requests   Final    BOTTLES DRAWN AEROBIC AND ANAEROBIC Blood Culture adequate volume Performed at [facility], [address], [place], [place] [address]    Culture   Final    NO GROWTH 2 DAYS Performed at [facility], [address] [place], [place] [address]    Report Status PENDING  Incomplete     Marcos Eke, NP [facility]  01/23/2023  1:04 PM

## 2023-01-24 ENCOUNTER — Telehealth: Payer: Self-pay | Admitting: *Deleted

## 2023-01-24 NOTE — Telephone Encounter (Signed)
Call from patient who was discharged yesterday from the hospital on Antibiotics.  Patient called requesting that he be given the Antibiotic IV at home. Thought this was going to be the order.  York Spaniel that he has taken the 1st round of pills at home and would now like to have an IV placed so that he can do the medication at home/

## 2023-01-24 NOTE — Telephone Encounter (Signed)
Added ID team to loop them in. Residents, please update [place] after you speak with patient. Thanks!

## 2023-01-25 DIAGNOSIS — I130 Hypertensive heart and chronic kidney disease with heart failure and stage 1 through stage 4 chronic kidney disease, or unspecified chronic kidney disease: Secondary | ICD-10-CM | POA: Diagnosis not present

## 2023-01-25 DIAGNOSIS — E1169 Type 2 diabetes mellitus with other specified complication: Secondary | ICD-10-CM | POA: Diagnosis not present

## 2023-01-25 DIAGNOSIS — E1122 Type 2 diabetes mellitus with diabetic chronic kidney disease: Secondary | ICD-10-CM | POA: Diagnosis not present

## 2023-01-25 DIAGNOSIS — E11621 Type 2 diabetes mellitus with foot ulcer: Secondary | ICD-10-CM | POA: Diagnosis not present

## 2023-01-25 DIAGNOSIS — D631 Anemia in chronic kidney disease: Secondary | ICD-10-CM | POA: Diagnosis not present

## 2023-01-25 DIAGNOSIS — N1831 Chronic kidney disease, stage 3a: Secondary | ICD-10-CM | POA: Diagnosis not present

## 2023-01-25 DIAGNOSIS — M86172 Other acute osteomyelitis, left ankle and foot: Secondary | ICD-10-CM | POA: Diagnosis not present

## 2023-01-25 DIAGNOSIS — I5022 Chronic systolic (congestive) heart failure: Secondary | ICD-10-CM | POA: Diagnosis not present

## 2023-01-25 DIAGNOSIS — L97522 Non-pressure chronic ulcer of other part of left foot with fat layer exposed: Secondary | ICD-10-CM | POA: Diagnosis not present

## 2023-01-26 ENCOUNTER — Encounter: Payer: Self-pay | Admitting: Student

## 2023-01-26 LAB — CULTURE, BLOOD (ROUTINE X 2)
Culture: NO GROWTH
Culture: NO GROWTH
Special Requests: ADEQUATE
Special Requests: ADEQUATE

## 2023-01-28 ENCOUNTER — Telehealth: Payer: Self-pay

## 2023-01-28 NOTE — Transitions of Care (Post Inpatient/ED Visit) (Signed)
01/28/2023  Name: Steven Cochran MRN: [id number] DOB: 08-Nov-1970  Today's TOC FU Call Status: Today's TOC FU Call Status:: Unsuccessful Call (1st Attempt)  Attempted to reach the patient regarding the most recent Inpatient/ED visit.  Follow Up Plan: Additional outreach attempts will be made to reach the patient to complete the Transitions of Care (Post Inpatient/ED visit) call.   Jodelle Gross RN, BSN, CCM [facility] RN Care Coordinator/ Transitions of Care Direct Dial: [phone]  Fax: [phone]

## 2023-01-28 NOTE — Transitions of Care (Post Inpatient/ED Visit) (Signed)
01/28/2023  Name: Steven Cochran MRN: [id number] DOB: 1970-08-15  Today's TOC FU Call Status: Today's TOC FU Call Status:: Successful TOC FU Call Completed TOC FU Call Complete Date: 01/28/23 Patient's Name and Date of Birth confirmed.  Transition Care Management Follow-up Telephone Call Date of Discharge: 01/23/23 Discharge Facility: [facility]) Type of Discharge: Inpatient Admission Primary Inpatient Discharge Diagnosis:: Acute Osteomyelitis Left Foot How have you been since you were released from the hospital?: Better Any questions or concerns?: No  Items Reviewed: Did you receive and understand the discharge instructions provided?: Yes Medications obtained,verified, and reconciled?: Yes (Medications Reviewed) Any new allergies since your discharge?: No Dietary orders reviewed?: No Do you have support at home?: No  Medications Reviewed Today: Medications Reviewed Today     Reviewed by Jodelle Gross, RN (Case Manager) on 01/28/23 at 1557  Med List Status: <None>   Medication Order Taking? Sig Documenting Provider Last Dose Status Informant  amLODipine (NORVASC) 5 MG tablet [id number] Yes Take 1 tablet (5 mg total) by mouth daily. Gwenevere Abbot, MD Taking Active Self, Pharmacy Records  amoxicillin-clavulanate (AUGMENTIN) 875-125 MG tablet [id number] Yes Take 1 tablet by mouth 2 (two) times daily. Veryl Speak, FNP Taking Active   aspirin EC 81 MG EC tablet [id number] Yes Take 1 tablet (81 mg total) by mouth daily. Swallow whole. Leroy Sea, MD Taking Active Self, Pharmacy Records  carvedilol (COREG) 25 MG tablet [id number] Yes Take 1 tablet (25 mg total) by mouth 2 (two) times daily. Gwenevere Abbot, MD Taking Active Self, Pharmacy Records  dorzolamide-timolol (COSOPT) 2-0.5 % ophthalmic solution [id number] Yes Place 2 drops into the right eye 2 (two) times daily. [provider] Taking Active Self, Pharmacy Records  doxycycline (VIBRA-TABS) 100 MG tablet  [id number] Yes Take 1 tablet (100 mg total) by mouth 2 (two) times daily. Veryl Speak, FNP Taking Active   empagliflozin (JARDIANCE) 25 MG TABS tablet [id number] Yes Take 1 tablet (25 mg total) by mouth daily. Gwenevere Abbot, MD Taking Active Self, Pharmacy Records  ezetimibe (ZETIA) 10 MG tablet [id number] No Take 1 tablet (10 mg total) by mouth daily.  Patient not taking: Reported on 01/22/2023   Gwenevere Abbot, MD Not Taking Active Self, Pharmacy Records  glucose blood (FREESTYLE LITE) test strip [id number] Yes Monitor blood sugar at home Lanae Boast, MD Taking Active Self, Pharmacy Records  insulin glargine (LANTUS) 100 UNIT/ML Solostar Pen [id number] Yes Inject 15 Units into the skin daily. Gwenevere Abbot, MD Taking Active Self, Pharmacy Records  Multiple Vitamin (MULTIVITAMIN WITH MINERALS) TABS tablet [id number] Yes Take 1 tablet by mouth daily. Marrianne Mood, MD Taking Active   rosuvastatin (CRESTOR) 40 MG tablet [id number] Yes Take 1 tablet by mouth once daily Gwenevere Abbot, MD Taking Active Self, Pharmacy Records            Home Care and Equipment/Supplies: Were Home Health Services Ordered?: Yes [facility] of [facility] Health Agency:: [facility] Has Agency set up a time to come to your home?: Yes First Home Health Visit Date: 01/27/23 Any new equipment or medical supplies ordered?: No  Functional Questionnaire: Do you need assistance with bathing/showering or dressing?: No Do you need assistance with meal preparation?: No Do you need assistance with eating?: No Do you have difficulty maintaining continence: No Do you need assistance with getting out of bed/getting out of a chair/moving?: No Do you have difficulty managing or taking your medications?: No  Follow up appointments reviewed: PCP Follow-up appointment confirmed?: Yes Date of  PCP follow-up appointment?: 02/07/23 Follow-up Provider: Dr. Rosaura Carpenter Specialist [facility] Follow-up appointment confirmed?: No Reason Specialist  Follow-Up Not Confirmed: Patient has Specialist Provider Number and will Call for Appointment (Patient will be having amputation in the next few weeks) Do you need transportation to your follow-up appointment?: No Do you understand care options if your condition(s) worsen?: Yes-patient verbalized understanding  SDOH Interventions Today    Flowsheet Row Most Recent Value  SDOH Interventions   Food Insecurity Interventions Intervention Not Indicated  Housing Interventions Intervention Not Indicated  Transportation Interventions Intervention Not Indicated     Jodelle Gross RN, BSN, CCM [facility] RN Care Coordinator/ Transitions of Care Direct Dial: [phone]  Fax: [phone]

## 2023-01-29 ENCOUNTER — Other Ambulatory Visit: Payer: Self-pay | Admitting: Internal Medicine

## 2023-01-29 DIAGNOSIS — E785 Hyperlipidemia, unspecified: Secondary | ICD-10-CM

## 2023-01-31 DIAGNOSIS — L97522 Non-pressure chronic ulcer of other part of left foot with fat layer exposed: Secondary | ICD-10-CM | POA: Diagnosis not present

## 2023-01-31 DIAGNOSIS — M86172 Other acute osteomyelitis, left ankle and foot: Secondary | ICD-10-CM | POA: Diagnosis not present

## 2023-01-31 DIAGNOSIS — E1169 Type 2 diabetes mellitus with other specified complication: Secondary | ICD-10-CM | POA: Diagnosis not present

## 2023-01-31 DIAGNOSIS — I130 Hypertensive heart and chronic kidney disease with heart failure and stage 1 through stage 4 chronic kidney disease, or unspecified chronic kidney disease: Secondary | ICD-10-CM | POA: Diagnosis not present

## 2023-01-31 DIAGNOSIS — N1831 Chronic kidney disease, stage 3a: Secondary | ICD-10-CM | POA: Diagnosis not present

## 2023-01-31 DIAGNOSIS — E1122 Type 2 diabetes mellitus with diabetic chronic kidney disease: Secondary | ICD-10-CM | POA: Diagnosis not present

## 2023-01-31 DIAGNOSIS — I5022 Chronic systolic (congestive) heart failure: Secondary | ICD-10-CM | POA: Diagnosis not present

## 2023-01-31 DIAGNOSIS — D631 Anemia in chronic kidney disease: Secondary | ICD-10-CM | POA: Diagnosis not present

## 2023-01-31 DIAGNOSIS — E11621 Type 2 diabetes mellitus with foot ulcer: Secondary | ICD-10-CM | POA: Diagnosis not present

## 2023-02-03 ENCOUNTER — Other Ambulatory Visit: Payer: Self-pay | Admitting: Internal Medicine

## 2023-02-03 DIAGNOSIS — E1159 Type 2 diabetes mellitus with other circulatory complications: Secondary | ICD-10-CM

## 2023-02-04 NOTE — Telephone Encounter (Signed)
Next appt scheduled 10/3 with Dr Rosaura Carpenter.

## 2023-02-06 ENCOUNTER — Other Ambulatory Visit (HOSPITAL_COMMUNITY): Payer: Self-pay

## 2023-02-06 MED ORDER — FREESTYLE LIBRE 3 SENSOR MISC
1.0000 | 2 refills | Status: DC
  Filled 2023-02-06: qty 2, 28d supply, fill #0
  Filled 2023-03-06: qty 2, 28d supply, fill #1

## 2023-02-07 ENCOUNTER — Encounter: Payer: Self-pay | Admitting: Student

## 2023-02-07 ENCOUNTER — Other Ambulatory Visit (HOSPITAL_COMMUNITY): Payer: Self-pay

## 2023-02-07 ENCOUNTER — Other Ambulatory Visit: Payer: Self-pay

## 2023-02-07 ENCOUNTER — Ambulatory Visit: Payer: Medicare HMO | Admitting: Student

## 2023-02-07 VITALS — BP 138/71 | HR 73 | Temp 98.0°F | Ht 75.0 in | Wt 257.5 lb

## 2023-02-07 DIAGNOSIS — I10 Essential (primary) hypertension: Secondary | ICD-10-CM

## 2023-02-07 DIAGNOSIS — I5022 Chronic systolic (congestive) heart failure: Secondary | ICD-10-CM | POA: Diagnosis not present

## 2023-02-07 DIAGNOSIS — I130 Hypertensive heart and chronic kidney disease with heart failure and stage 1 through stage 4 chronic kidney disease, or unspecified chronic kidney disease: Secondary | ICD-10-CM | POA: Diagnosis not present

## 2023-02-07 DIAGNOSIS — D638 Anemia in other chronic diseases classified elsewhere: Secondary | ICD-10-CM

## 2023-02-07 DIAGNOSIS — Z794 Long term (current) use of insulin: Secondary | ICD-10-CM

## 2023-02-07 DIAGNOSIS — E1169 Type 2 diabetes mellitus with other specified complication: Secondary | ICD-10-CM

## 2023-02-07 DIAGNOSIS — M869 Osteomyelitis, unspecified: Secondary | ICD-10-CM

## 2023-02-07 DIAGNOSIS — N179 Acute kidney failure, unspecified: Secondary | ICD-10-CM

## 2023-02-07 DIAGNOSIS — E11621 Type 2 diabetes mellitus with foot ulcer: Secondary | ICD-10-CM | POA: Diagnosis not present

## 2023-02-07 DIAGNOSIS — N182 Chronic kidney disease, stage 2 (mild): Secondary | ICD-10-CM | POA: Diagnosis not present

## 2023-02-07 DIAGNOSIS — Z7984 Long term (current) use of oral hypoglycemic drugs: Secondary | ICD-10-CM

## 2023-02-07 DIAGNOSIS — M86172 Other acute osteomyelitis, left ankle and foot: Secondary | ICD-10-CM | POA: Diagnosis not present

## 2023-02-07 DIAGNOSIS — E785 Hyperlipidemia, unspecified: Secondary | ICD-10-CM

## 2023-02-07 NOTE — Assessment & Plan Note (Addendum)
BP Readings from Last 3 Encounters:  02/07/23 138/71  01/23/23 128/84  01/14/23 138/85  Patient is taking amlodipine 5 mg and carvedilol 25 mg BID. Continue the current regimen.  Patient denies any vision changes, chest pain, and shortness of breath

## 2023-02-07 NOTE — Assessment & Plan Note (Signed)
Spironolactone and Sherryll Burger was initially held in the hospitalization due to acute kidney injury, we will recheck his creatinine today in the clinic.  If his creatinine is back to baseline, we will restart his spironolactone and Entresto.  Otherwise continue taking Jardiance and Coreg.

## 2023-02-07 NOTE — Assessment & Plan Note (Signed)
Lab Results  Component Value Date   CREATININE 1.66 (H) 01/23/2023  Patient was instructed to hold spironolactone and Entresto due to AKI after hospitalization. His home medications including spironolactone, Jardiance, and Entresto. -We will Check BMP and Cr levels here -If Cr back to baseline, will have the patient restart the medications

## 2023-02-07 NOTE — Assessment & Plan Note (Signed)
Lab Results  Component Value Date   CHOL 144 01/14/2023   HDL 37 (L) 01/14/2023   LDLCALC 83 01/14/2023   TRIG 138 01/14/2023   CHOLHDL 3.9 01/14/2023  Patient with previous history of CVA.  Patient is currently taking acetamide 10 mg daily and Crestor 40 mg daily.  Will continue on this regimen.  Will recheck his lipids at the next office visit. -Continue ezetimibe 10 mg daily -Continue Crestor 40 mg daily -Check lipids at the next office visit

## 2023-02-07 NOTE — Assessment & Plan Note (Addendum)
Lab Results  Component Value Date   WBC 7.2 01/23/2023   HGB 9.4 (L) 01/23/2023   HCT 30.8 (L) 01/23/2023   MCV 73.7 (L) 01/23/2023   PLT 262 01/23/2023   Lab Results  Component Value Date   IRON 63 01/14/2023   TIBC 225 (L) 01/14/2023   FERRITIN 630 (H) 01/14/2023  Colonoscopy on 11/21/2022 was normal.  Patient was recommended for repeat in 3 years due to inadequate prep.  Patient denies any hematochezia, melena, hemoptysis.  Patient denies any lightheadedness or dizziness.  Based on the most recent labs, iron is normal.  With high ferritin and low TIBC.  Likely anemia of chronic disease.  Will continue to monitor hemoglobin.

## 2023-02-07 NOTE — Assessment & Plan Note (Signed)
Patient was hospitalized on January 21, 2023 for osteomyelitis of his left foot.  He was started on IV daptomycin, Flagyl, Rocephin.  Ortho was consulted and recommended below-knee amputation, patient was against the amputation because he had concerns about moving from first floor of the apartment to the third floor.  He wished to delay the amputation.  Infectious disease was consulted and they recommended empiric doxycycline and Augmentin for 2 months but they also recommended surgery before 2 months. Patient was advised to follow up with Dr. Lajoyce Corners the orthopedics. Since he has been out of the hospital, he reports decrease in swelling left lower leg, no pain or tenderness. Reports chronic numbness/tingling of the lower legs. He reports he can bear weight, has been bicycling without any concerns. Denies fever or chills. Pt refused PE of the left foot, states it is hard for him to take off his socks. Denies any open wounds, ulceration or discharge. Reports nurse aid comes it every week for wound dressing.  -Patient advised to send [place] picture in the mychart. -Continue taking Augmentin BID and doxycycline BID for 2 months. -Patient has an appointment with Dr. Veleta Miners at [facility], podiatrist on 02/28/2023 for second opinion. -Recommend cleanse the left foot with saline, pat dry. Cover with single layer of Xeroform gauze, top with dry dressing, Kerlix. Change daily.

## 2023-02-07 NOTE — Assessment & Plan Note (Signed)
Lab Results  Component Value Date   HGBA1C 8.1 (A) 01/14/2023   Patient reports he takes Lantus 15 units daily and Jardiance 25 mg daily.  Patient reports that CGM stopped working about 2 days ago, he has another 1 ordered today and he will pick it up after the visit. He states that his CGM showed alerts at night with low 50s; daytime around 140-150s. Patient was advised to continue checking his blood sugars at home, if his blood sugars are running as low as 60s and 50s, he should decrease his Lantus to 12 units daily from 15 units.

## 2023-02-07 NOTE — Patient Instructions (Addendum)
Thank you, [name] for allowing [place] to provide your care today. Today we discussed:  Infection left leg -Continue taking Augmentin twice daily and doxycycline twice daily for 2 months -Please follow-up with [facility] podiatry   HTN/HFmrEF -Please continue taking amlodipine 5 mg and carvedilol 25 mg twice daily. -Please currently hold spironolactone and Entresto, until we get results of your kidney function.  I will call you and notify you tomorrow.  Diabetes  -Please pick up your CGM from the pharmacy, and to note that if your blood glucose is dropping too low, less than 60 please decrease your Lantus to 12 units daily instead of 15 units. -Please continue taking Jardiance 25 mg daily  HLD Please continue taking rosuvastatin 40 mg daily and ezetimibe 10 mg daily.   I have ordered the following labs for you:   Lab Orders         CBC no Diff         BMP8+Anion Gap      Tests ordered today:  As above  Referrals ordered today:   Referral Orders  No referral(s) requested today     I have ordered the following medication/changed the following medications:   Stop the following medications: There are no discontinued medications.   Start the following medications: No orders of the defined types were placed in this encounter.    Follow up:  2 months DM/HTN     Remember:   Should you have any questions or concerns please call the internal medicine clinic at [phone].     Jeral Pinch, DO [facility]

## 2023-02-07 NOTE — Progress Notes (Signed)
Established Patient Office Visit  Subjective   Patient ID: Steven Cochran, male    DOB: 07-Sep-1970  Age: 52 y.o. MRN: [id number]  Chief Complaint  Patient presents with   Transitions Of Care    HPI  This is a 52 year old male living with a history stated below and presents today for HFU. Please see problem based assessment and plan for additional details.  Patient Active Problem List   Diagnosis Date Noted   Acute renal failure superimposed on stage 2 chronic kidney disease (HCC) 01/21/2023   History of stroke 01/21/2023   Primary open angle glaucoma 01/15/2023   Essential hypertension 09/27/2022   Acute osteomyelitis of left foot (HCC) 09/18/2022   Retinopathy due to secondary diabetes mellitus (HCC) 08/29/2022   Preventive measure 07/15/2022   Hyperlipidemia associated with type 2 diabetes mellitus (HCC) 07/15/2022   Anemia of chronic disease 07/15/2022   Diabetic ulcer of left foot associated with type 2 diabetes mellitus (HCC) 06/14/2022   Embolic stroke (HCC) 09/02/2021   Type II diabetes mellitus with neurological manifestations (HCC) 09/02/2021   Chronic systolic CHF (congestive heart failure) (HCC) 09/02/2021   HTN (hypertension) 06/30/2012   Past Medical History:  Diagnosis Date   Cataract    CHF (congestive heart failure) (HCC)    Diabetes mellitus    Hypertension    Stroke [facility])    Past Surgical History:  Procedure Laterality Date   AMPUTATION Left 09/26/2022   Procedure: LEFT FOOT 5TH RAY AMPUTATION;  Surgeon: Nadara Mustard, MD;  Location: [facility] OR;  Service: Orthopedics;  Laterality: Left;   CATARACT EXTRACTION Bilateral    SKIN GRAFT     feet   Social History   Socioeconomic History   Marital status: Single    Spouse name: Not on file   Number of children: Not on file   Years of education: Not on file   Highest education level: Not on file  Occupational History   Not on file  Tobacco Use   Smoking status: Never   Smokeless tobacco: Never  Vaping  Use   Vaping status: Never Used  Substance and Sexual Activity   Alcohol use: No   Drug use: No   Sexual activity: Not Currently    Partners: Female    Birth control/protection: None  Other Topics Concern   Not on file  Social History Narrative   Not on file   Social Determinants of Health   Financial Resource Strain: Low Risk  (07/12/2022)   Overall Financial Resource Strain (CARDIA)    Difficulty of Paying Living Expenses: Not hard at all  Food Insecurity: No Food Insecurity (01/28/2023)   Hunger Vital Sign    Worried About Running Out of Food in the Last Year: Never true    Ran Out of Food in the Last Year: Never true  Transportation Needs: No Transportation Needs (01/28/2023)   PRAPARE - Administrator, Civil Service (Medical): No    Lack of Transportation (Non-Medical): No  Physical Activity: Sufficiently Active (07/12/2022)   Exercise Vital Sign    Days of Exercise per Week: 5 days    Minutes of Exercise per Session: 40 min  Stress: No Stress Concern Present (07/12/2022)   [employer] of Occupational Health - Occupational Stress Questionnaire    Feeling of Stress : Not at all  Social Connections: Socially Isolated (01/14/2023)   Social Connection and Isolation Panel [NHANES]    Frequency of Communication with Friends and Family: Twice a week  Frequency of Social Gatherings with Friends and Family: Never    Attends Religious Services: Never    Database administrator or Organizations: No    Attends Banker Meetings: Never    Marital Status: Divorced  Catering manager Violence: Not At Risk (01/22/2023)   Humiliation, Afraid, Rape, and Kick questionnaire    Fear of Current or Ex-Partner: No    Emotionally Abused: No    Physically Abused: No    Sexually Abused: No   Family History  Problem Relation Age of Onset   Colon cancer Neg Hx    Esophageal cancer Neg Hx    Stomach cancer Neg Hx    Rectal cancer Neg Hx    Allergies  Allergen  Reactions   Metformin And Related Diarrhea    Review of Systems  Constitutional:  Negative for chills and fever.  HENT:  Negative for congestion and sore throat.   Eyes:  Negative for blurred vision.  Respiratory:  Negative for cough and shortness of breath.   Cardiovascular:  Negative for chest pain and leg swelling.  Gastrointestinal:  Negative for constipation, diarrhea, melena, nausea and vomiting.  Genitourinary:  Negative for dysuria and hematuria.  Neurological:  Negative for dizziness, weakness and headaches.  Endo/Heme/Allergies:  Does not bruise/bleed easily.      Objective:     BP 138/71 (BP Location: Left Arm, Patient Position: Sitting, Cuff Size: Large)   Pulse 73   Temp 98 F (36.7 C) (Oral)   Ht 6\' 3"  (1.905 m)   Wt 257 lb 8 oz (116.8 kg)   SpO2 100%   BMI 32.19 kg/m  BP Readings from Last 3 Encounters:  02/07/23 138/71  01/23/23 128/84  01/14/23 138/85   Wt Readings from Last 3 Encounters:  02/07/23 257 lb 8 oz (116.8 kg)  01/22/23 269 lb 1.6 oz (122.1 kg)  01/14/23 263 lb (119.3 kg)      Physical Exam  Constitutional: sitting in chair, in no acute distress HENT: normocephalic atraumatic, mucous membranes moist Eyes: conjunctiva non-erythematous Cardiovascular: regular rate and rhythm, no m/r/g, 1+ pitting edema up to left mid-shin  Pulmonary/Chest: normal work of breathing on room air, lungs clear to auscultation bilaterally Abdominal: soft, non-tender, non-distended MSK: no TTP of the bilateral LE  Neurological: alert & oriented x 3, no focal deficit Skin: warm and dry Psych: normal mood and behavior   No results found for any visits on 02/07/23.  Last CBC Lab Results  Component Value Date   WBC 7.2 01/23/2023   HGB 9.4 (L) 01/23/2023   HCT 30.8 (L) 01/23/2023   MCV 73.7 (L) 01/23/2023   MCH 22.5 (L) 01/23/2023   RDW 15.9 (H) 01/23/2023   PLT 262 01/23/2023   Last metabolic panel Lab Results  Component Value Date   GLUCOSE 183  (H) 01/23/2023   NA 134 (L) 01/23/2023   K 4.3 01/23/2023   CL 105 01/23/2023   CO2 19 (L) 01/23/2023   BUN 30 (H) 01/23/2023   CREATININE 1.66 (H) 01/23/2023   GFRNONAA 50 (L) 01/23/2023   CALCIUM 8.6 (L) 01/23/2023   PHOS 3.3 06/15/2022   PROT 7.7 01/21/2023   ALBUMIN 3.9 01/21/2023   BILITOT 0.7 01/21/2023   ALKPHOS 83 01/21/2023   AST 23 01/21/2023   ALT 26 01/21/2023   ANIONGAP 10 01/23/2023   Last lipids Lab Results  Component Value Date   CHOL 144 01/14/2023   HDL 37 (L) 01/14/2023   LDLCALC 83 01/14/2023  TRIG 138 01/14/2023   CHOLHDL 3.9 01/14/2023   Last hemoglobin A1c Lab Results  Component Value Date   HGBA1C 8.1 (A) 01/14/2023      The ASCVD Risk score (Arnett DK, et al., 2019) failed to calculate for the following reasons:   The patient has a prior MI or stroke diagnosis    Assessment & Plan:   Problem List Items Addressed This Visit       Cardiovascular and Mediastinum   HTN (hypertension) (Chronic)    BP Readings from Last 3 Encounters:  02/07/23 138/71  01/23/23 128/84  01/14/23 138/85  Patient is taking amlodipine 5 mg and carvedilol 25 mg BID. Continue the current regimen.  Patient denies any vision changes, chest pain, and shortness of breath       Chronic systolic CHF (congestive heart failure) (HCC) (Chronic)    Spironolactone and Entresto was initially held in the hospitalization due to acute kidney injury, we will recheck his creatinine today in the clinic.  If his creatinine is back to baseline, we will restart his spironolactone and Entresto.  Otherwise continue taking Jardiance and Coreg.        Endocrine   Diabetic ulcer of left foot associated with type 2 diabetes mellitus (HCC)    Lab Results  Component Value Date   HGBA1C 8.1 (A) 01/14/2023   Patient reports he takes Lantus 15 units daily and Jardiance 25 mg daily.  Patient reports that CGM stopped working about 2 days ago, he has another 1 ordered today and he will pick  it up after the visit. He states that his CGM showed alerts at night with low 50s; daytime around 140-150s. Patient was advised to continue checking his blood sugars at home, if his blood sugars are running as low as 60s and 50s, he should decrease his Lantus to 12 units daily from 15 units.      Hyperlipidemia associated with type 2 diabetes mellitus (HCC)    Lab Results  Component Value Date   CHOL 144 01/14/2023   HDL 37 (L) 01/14/2023   LDLCALC 83 01/14/2023   TRIG 138 01/14/2023   CHOLHDL 3.9 01/14/2023  Patient with previous history of CVA.  Patient is currently taking acetamide 10 mg daily and Crestor 40 mg daily.  Will continue on this regimen.  Will recheck his lipids at the next office visit. -Continue ezetimibe 10 mg daily -Continue Crestor 40 mg daily -Check lipids at the next office visit        Musculoskeletal and Integument   Acute osteomyelitis of left foot  [facility])    Patient was hospitalized on January 21, 2023 for osteomyelitis of his left foot.  He was started on IV daptomycin, Flagyl, Rocephin.  Ortho was consulted and recommended below-knee amputation, patient was against the amputation because he had concerns about moving from first floor of the apartment to the third floor.  He wished to delay the amputation.  Infectious disease was consulted and they recommended empiric doxycycline and Augmentin for 2 months but they also recommended surgery before 2 months. Patient was advised to follow up with Dr. Lajoyce Corners the orthopedics. Since he has been out of the hospital, he reports decrease in swelling left lower leg, no pain or tenderness. Reports chronic numbness/tingling of the lower legs. He reports he can bear weight, has been bicycling without any concerns. Denies fever or chills. Pt refused PE of the left foot, states it is hard for him to take off his socks. Denies any  open wounds, ulceration or discharge. Reports nurse aid comes it every week for wound dressing.  -Patient  advised to send [place] picture in the mychart. -Continue taking Augmentin BID and doxycycline BID for 2 months. -Patient has an appointment with Dr. Veleta Miners at [facility], podiatrist on 02/28/2023 for second opinion. -Recommend cleanse the left foot with saline, pat dry. Cover with single layer of Xeroform gauze, top with dry dressing, Kerlix. Change daily.         Genitourinary   Acute renal failure superimposed on stage 2 chronic kidney disease (HCC)    Lab Results  Component Value Date   CREATININE 1.66 (H) 01/23/2023  Patient was instructed to hold spironolactone and Entresto due to AKI after hospitalization. His home medications including spironolactone, Jardiance, and Entresto. -We will Check BMP and Cr levels here -If Cr back to baseline, will have the patient restart the medications        Other   Anemia of chronic disease (Chronic)    Lab Results  Component Value Date   WBC 7.2 01/23/2023   HGB 9.4 (L) 01/23/2023   HCT 30.8 (L) 01/23/2023   MCV 73.7 (L) 01/23/2023   PLT 262 01/23/2023   Lab Results  Component Value Date   IRON 63 01/14/2023   TIBC 225 (L) 01/14/2023   FERRITIN 630 (H) 01/14/2023  Colonoscopy on 11/21/2022 was normal.  Patient was recommended for repeat in 3 years due to inadequate prep.  Patient denies any hematochezia, melena, hemoptysis.  Patient denies any lightheadedness or dizziness.  Based on the most recent labs, iron is normal.  With high ferritin and low TIBC.  Likely anemia of chronic disease.  Will continue to monitor hemoglobin.       Other Visit Diagnoses     Osteomyelitis of fifth toe of left foot (HCC)    -  Primary   Relevant Orders   CBC no Diff   BMP8+Anion Gap       Return in about 2 months (around 04/09/2023) for DM/HTN.    Jeral Pinch, DO

## 2023-02-08 ENCOUNTER — Other Ambulatory Visit: Payer: Self-pay | Admitting: Student

## 2023-02-08 DIAGNOSIS — D631 Anemia in chronic kidney disease: Secondary | ICD-10-CM | POA: Diagnosis not present

## 2023-02-08 DIAGNOSIS — L97522 Non-pressure chronic ulcer of other part of left foot with fat layer exposed: Secondary | ICD-10-CM | POA: Diagnosis not present

## 2023-02-08 DIAGNOSIS — M86172 Other acute osteomyelitis, left ankle and foot: Secondary | ICD-10-CM | POA: Diagnosis not present

## 2023-02-08 DIAGNOSIS — E1122 Type 2 diabetes mellitus with diabetic chronic kidney disease: Secondary | ICD-10-CM | POA: Diagnosis not present

## 2023-02-08 DIAGNOSIS — I5022 Chronic systolic (congestive) heart failure: Secondary | ICD-10-CM

## 2023-02-08 DIAGNOSIS — E11621 Type 2 diabetes mellitus with foot ulcer: Secondary | ICD-10-CM | POA: Diagnosis not present

## 2023-02-08 DIAGNOSIS — I130 Hypertensive heart and chronic kidney disease with heart failure and stage 1 through stage 4 chronic kidney disease, or unspecified chronic kidney disease: Secondary | ICD-10-CM | POA: Diagnosis not present

## 2023-02-08 DIAGNOSIS — N1831 Chronic kidney disease, stage 3a: Secondary | ICD-10-CM | POA: Diagnosis not present

## 2023-02-08 DIAGNOSIS — E1169 Type 2 diabetes mellitus with other specified complication: Secondary | ICD-10-CM | POA: Diagnosis not present

## 2023-02-08 LAB — CBC
Hematocrit: 32.1 % — ABNORMAL LOW (ref 37.5–51.0)
Hemoglobin: 9.3 g/dL — ABNORMAL LOW (ref 13.0–17.7)
MCH: 22 pg — ABNORMAL LOW (ref 26.6–33.0)
MCHC: 29 g/dL — ABNORMAL LOW (ref 31.5–35.7)
MCV: 76 fL — ABNORMAL LOW (ref 79–97)
Platelets: 463 10*3/uL — ABNORMAL HIGH (ref 150–450)
RBC: 4.22 x10E6/uL (ref 4.14–5.80)
RDW: 14.6 % (ref 11.6–15.4)
WBC: 6.4 10*3/uL (ref 3.4–10.8)

## 2023-02-08 LAB — BMP8+ANION GAP
Anion Gap: 13 mmol/L (ref 10.0–18.0)
BUN/Creatinine Ratio: 14 (ref 9–20)
BUN: 18 mg/dL (ref 6–24)
CO2: 24 mmol/L (ref 20–29)
Calcium: 9.4 mg/dL (ref 8.7–10.2)
Chloride: 103 mmol/L (ref 96–106)
Creatinine, Ser: 1.29 mg/dL — ABNORMAL HIGH (ref 0.76–1.27)
Glucose: 197 mg/dL — ABNORMAL HIGH (ref 70–99)
Potassium: 5.2 mmol/L (ref 3.5–5.2)
Sodium: 140 mmol/L (ref 134–144)
eGFR: 67 mL/min/{1.73_m2} (ref >59)

## 2023-02-08 NOTE — Progress Notes (Signed)
Internal Medicine Clinic Attending  I was physically present during the key portions of the resident provided service and participated in the medical decision making of patient's management care. I reviewed pertinent patient test results.  The assessment, diagnosis, and plan were formulated together and I agree with the documentation in the resident's note.  Mercie Eon, MD     Patient's Cr is back to baseline. K is 5.2. I worry about hyperkalemia with restarting both Entresto & Spironolactone. I recommend restarting Entresto only, with repeat BMP (lab only visit is fine) in 1-2 weeks.

## 2023-02-08 NOTE — Addendum Note (Signed)
Addended by: Derrek Monaco on: 02/08/2023 10:40 AM   Modules accepted: Level of Service

## 2023-02-14 DIAGNOSIS — E11621 Type 2 diabetes mellitus with foot ulcer: Secondary | ICD-10-CM | POA: Diagnosis not present

## 2023-02-14 DIAGNOSIS — I5022 Chronic systolic (congestive) heart failure: Secondary | ICD-10-CM | POA: Diagnosis not present

## 2023-02-14 DIAGNOSIS — I130 Hypertensive heart and chronic kidney disease with heart failure and stage 1 through stage 4 chronic kidney disease, or unspecified chronic kidney disease: Secondary | ICD-10-CM | POA: Diagnosis not present

## 2023-02-14 DIAGNOSIS — M86172 Other acute osteomyelitis, left ankle and foot: Secondary | ICD-10-CM | POA: Diagnosis not present

## 2023-02-14 DIAGNOSIS — L97522 Non-pressure chronic ulcer of other part of left foot with fat layer exposed: Secondary | ICD-10-CM | POA: Diagnosis not present

## 2023-02-14 DIAGNOSIS — D631 Anemia in chronic kidney disease: Secondary | ICD-10-CM | POA: Diagnosis not present

## 2023-02-14 DIAGNOSIS — E1169 Type 2 diabetes mellitus with other specified complication: Secondary | ICD-10-CM | POA: Diagnosis not present

## 2023-02-14 DIAGNOSIS — N1831 Chronic kidney disease, stage 3a: Secondary | ICD-10-CM | POA: Diagnosis not present

## 2023-02-14 DIAGNOSIS — E1122 Type 2 diabetes mellitus with diabetic chronic kidney disease: Secondary | ICD-10-CM | POA: Diagnosis not present

## 2023-02-19 DIAGNOSIS — E11621 Type 2 diabetes mellitus with foot ulcer: Secondary | ICD-10-CM | POA: Diagnosis not present

## 2023-02-19 DIAGNOSIS — D631 Anemia in chronic kidney disease: Secondary | ICD-10-CM | POA: Diagnosis not present

## 2023-02-19 DIAGNOSIS — N1831 Chronic kidney disease, stage 3a: Secondary | ICD-10-CM | POA: Diagnosis not present

## 2023-02-19 DIAGNOSIS — E1169 Type 2 diabetes mellitus with other specified complication: Secondary | ICD-10-CM | POA: Diagnosis not present

## 2023-02-19 DIAGNOSIS — E1122 Type 2 diabetes mellitus with diabetic chronic kidney disease: Secondary | ICD-10-CM | POA: Diagnosis not present

## 2023-02-19 DIAGNOSIS — I5022 Chronic systolic (congestive) heart failure: Secondary | ICD-10-CM | POA: Diagnosis not present

## 2023-02-19 DIAGNOSIS — I130 Hypertensive heart and chronic kidney disease with heart failure and stage 1 through stage 4 chronic kidney disease, or unspecified chronic kidney disease: Secondary | ICD-10-CM | POA: Diagnosis not present

## 2023-02-19 DIAGNOSIS — L97522 Non-pressure chronic ulcer of other part of left foot with fat layer exposed: Secondary | ICD-10-CM | POA: Diagnosis not present

## 2023-02-19 DIAGNOSIS — M86172 Other acute osteomyelitis, left ankle and foot: Secondary | ICD-10-CM | POA: Diagnosis not present

## 2023-02-21 DIAGNOSIS — N1831 Chronic kidney disease, stage 3a: Secondary | ICD-10-CM | POA: Diagnosis not present

## 2023-02-21 DIAGNOSIS — L97522 Non-pressure chronic ulcer of other part of left foot with fat layer exposed: Secondary | ICD-10-CM | POA: Diagnosis not present

## 2023-02-21 DIAGNOSIS — E1169 Type 2 diabetes mellitus with other specified complication: Secondary | ICD-10-CM | POA: Diagnosis not present

## 2023-02-21 DIAGNOSIS — D631 Anemia in chronic kidney disease: Secondary | ICD-10-CM | POA: Diagnosis not present

## 2023-02-21 DIAGNOSIS — I130 Hypertensive heart and chronic kidney disease with heart failure and stage 1 through stage 4 chronic kidney disease, or unspecified chronic kidney disease: Secondary | ICD-10-CM | POA: Diagnosis not present

## 2023-02-21 DIAGNOSIS — E11621 Type 2 diabetes mellitus with foot ulcer: Secondary | ICD-10-CM | POA: Diagnosis not present

## 2023-02-21 DIAGNOSIS — E1122 Type 2 diabetes mellitus with diabetic chronic kidney disease: Secondary | ICD-10-CM | POA: Diagnosis not present

## 2023-02-21 DIAGNOSIS — I5022 Chronic systolic (congestive) heart failure: Secondary | ICD-10-CM | POA: Diagnosis not present

## 2023-02-21 DIAGNOSIS — M86172 Other acute osteomyelitis, left ankle and foot: Secondary | ICD-10-CM | POA: Diagnosis not present

## 2023-02-25 ENCOUNTER — Other Ambulatory Visit: Payer: Self-pay | Admitting: Internal Medicine

## 2023-02-25 DIAGNOSIS — E785 Hyperlipidemia, unspecified: Secondary | ICD-10-CM

## 2023-02-25 MED ORDER — ROSUVASTATIN CALCIUM 40 MG PO TABS
40.0000 mg | ORAL_TABLET | Freq: Every day | ORAL | 0 refills | Status: DC
Start: 2023-02-25 — End: 2023-05-10

## 2023-02-25 NOTE — Telephone Encounter (Signed)
  rosuvastatin (CRESTOR) 40 MG tablet  [facility] - [place], [place] - [address]

## 2023-02-27 ENCOUNTER — Telehealth: Payer: Self-pay | Admitting: Internal Medicine

## 2023-02-27 DIAGNOSIS — E1122 Type 2 diabetes mellitus with diabetic chronic kidney disease: Secondary | ICD-10-CM | POA: Diagnosis not present

## 2023-02-27 DIAGNOSIS — E11621 Type 2 diabetes mellitus with foot ulcer: Secondary | ICD-10-CM | POA: Diagnosis not present

## 2023-02-27 DIAGNOSIS — I130 Hypertensive heart and chronic kidney disease with heart failure and stage 1 through stage 4 chronic kidney disease, or unspecified chronic kidney disease: Secondary | ICD-10-CM | POA: Diagnosis not present

## 2023-02-27 DIAGNOSIS — M86172 Other acute osteomyelitis, left ankle and foot: Secondary | ICD-10-CM | POA: Diagnosis not present

## 2023-02-27 DIAGNOSIS — D631 Anemia in chronic kidney disease: Secondary | ICD-10-CM | POA: Diagnosis not present

## 2023-02-27 DIAGNOSIS — E1169 Type 2 diabetes mellitus with other specified complication: Secondary | ICD-10-CM | POA: Diagnosis not present

## 2023-02-27 DIAGNOSIS — N1831 Chronic kidney disease, stage 3a: Secondary | ICD-10-CM | POA: Diagnosis not present

## 2023-02-27 DIAGNOSIS — I5022 Chronic systolic (congestive) heart failure: Secondary | ICD-10-CM | POA: Diagnosis not present

## 2023-02-27 DIAGNOSIS — L97522 Non-pressure chronic ulcer of other part of left foot with fat layer exposed: Secondary | ICD-10-CM | POA: Diagnosis not present

## 2023-02-27 NOTE — Telephone Encounter (Signed)
Please call back  to speak with [facility] LPN Kiouna H. Please call back to [phone]. Pt is currently running a fever.

## 2023-02-27 NOTE — Telephone Encounter (Signed)
RTC to [employer] with [place].  Patient has a temperature of 100.2.  Patient als received a Flu and Shingles  [place] on Monday. Felt bad on Yesterday .  Is feeling better today and feels fine.  Wound is looking good today as well.  Shona Needles just wanted to notify the Clinics of his temperature .

## 2023-03-04 ENCOUNTER — Encounter (HOSPITAL_COMMUNITY): Payer: Medicare HMO

## 2023-03-04 DIAGNOSIS — I5032 Chronic diastolic (congestive) heart failure: Secondary | ICD-10-CM | POA: Diagnosis not present

## 2023-03-04 DIAGNOSIS — M19072 Primary osteoarthritis, left ankle and foot: Secondary | ICD-10-CM | POA: Diagnosis not present

## 2023-03-04 DIAGNOSIS — E11622 Type 2 diabetes mellitus with other skin ulcer: Secondary | ICD-10-CM | POA: Diagnosis not present

## 2023-03-04 DIAGNOSIS — L97304 Non-pressure chronic ulcer of unspecified ankle with necrosis of bone: Secondary | ICD-10-CM | POA: Diagnosis not present

## 2023-03-04 DIAGNOSIS — M7732 Calcaneal spur, left foot: Secondary | ICD-10-CM | POA: Diagnosis not present

## 2023-03-04 DIAGNOSIS — M2142 Flat foot [pes planus] (acquired), left foot: Secondary | ICD-10-CM | POA: Diagnosis not present

## 2023-03-04 DIAGNOSIS — E08622 Diabetes mellitus due to underlying condition with other skin ulcer: Secondary | ICD-10-CM | POA: Diagnosis not present

## 2023-03-04 DIAGNOSIS — L97324 Non-pressure chronic ulcer of left ankle with necrosis of bone: Secondary | ICD-10-CM | POA: Diagnosis not present

## 2023-03-04 DIAGNOSIS — M86672 Other chronic osteomyelitis, left ankle and foot: Secondary | ICD-10-CM | POA: Diagnosis not present

## 2023-03-05 ENCOUNTER — Telehealth (HOSPITAL_COMMUNITY): Payer: Self-pay

## 2023-03-05 DIAGNOSIS — E1122 Type 2 diabetes mellitus with diabetic chronic kidney disease: Secondary | ICD-10-CM | POA: Diagnosis not present

## 2023-03-05 DIAGNOSIS — I130 Hypertensive heart and chronic kidney disease with heart failure and stage 1 through stage 4 chronic kidney disease, or unspecified chronic kidney disease: Secondary | ICD-10-CM | POA: Diagnosis not present

## 2023-03-05 DIAGNOSIS — E11621 Type 2 diabetes mellitus with foot ulcer: Secondary | ICD-10-CM | POA: Diagnosis not present

## 2023-03-05 DIAGNOSIS — M86172 Other acute osteomyelitis, left ankle and foot: Secondary | ICD-10-CM | POA: Diagnosis not present

## 2023-03-05 DIAGNOSIS — E1169 Type 2 diabetes mellitus with other specified complication: Secondary | ICD-10-CM | POA: Diagnosis not present

## 2023-03-05 DIAGNOSIS — I5022 Chronic systolic (congestive) heart failure: Secondary | ICD-10-CM | POA: Diagnosis not present

## 2023-03-05 DIAGNOSIS — N1831 Chronic kidney disease, stage 3a: Secondary | ICD-10-CM | POA: Diagnosis not present

## 2023-03-05 DIAGNOSIS — L97522 Non-pressure chronic ulcer of other part of left foot with fat layer exposed: Secondary | ICD-10-CM | POA: Diagnosis not present

## 2023-03-05 DIAGNOSIS — D631 Anemia in chronic kidney disease: Secondary | ICD-10-CM | POA: Diagnosis not present

## 2023-03-05 NOTE — Telephone Encounter (Signed)
Front office called patient at [phone] to reschedule appointment from Friday, 03/15/2023.   Front office left a voicemail for patient to call office back to reschedule appointment since the provider will be out of office on 03/15/2023.

## 2023-03-06 ENCOUNTER — Telehealth (HOSPITAL_COMMUNITY): Payer: Self-pay

## 2023-03-06 NOTE — Telephone Encounter (Signed)
Called patient at [phone] to try and reschedule patients appointment with the APP clinic due to the provider being out of office.   Front office left another voicemail for this patient to call back to reschedule 03/15/2023 appt.

## 2023-03-07 ENCOUNTER — Other Ambulatory Visit: Payer: Self-pay | Admitting: Internal Medicine

## 2023-03-07 DIAGNOSIS — E1159 Type 2 diabetes mellitus with other circulatory complications: Secondary | ICD-10-CM

## 2023-03-08 ENCOUNTER — Other Ambulatory Visit: Payer: Self-pay

## 2023-03-08 ENCOUNTER — Other Ambulatory Visit (HOSPITAL_COMMUNITY): Payer: Self-pay

## 2023-03-08 DIAGNOSIS — E1169 Type 2 diabetes mellitus with other specified complication: Secondary | ICD-10-CM | POA: Diagnosis not present

## 2023-03-08 DIAGNOSIS — D631 Anemia in chronic kidney disease: Secondary | ICD-10-CM | POA: Diagnosis not present

## 2023-03-08 DIAGNOSIS — L97522 Non-pressure chronic ulcer of other part of left foot with fat layer exposed: Secondary | ICD-10-CM | POA: Diagnosis not present

## 2023-03-08 DIAGNOSIS — N1831 Chronic kidney disease, stage 3a: Secondary | ICD-10-CM | POA: Diagnosis not present

## 2023-03-08 DIAGNOSIS — I130 Hypertensive heart and chronic kidney disease with heart failure and stage 1 through stage 4 chronic kidney disease, or unspecified chronic kidney disease: Secondary | ICD-10-CM | POA: Diagnosis not present

## 2023-03-08 DIAGNOSIS — I5022 Chronic systolic (congestive) heart failure: Secondary | ICD-10-CM | POA: Diagnosis not present

## 2023-03-08 DIAGNOSIS — M86172 Other acute osteomyelitis, left ankle and foot: Secondary | ICD-10-CM | POA: Diagnosis not present

## 2023-03-08 DIAGNOSIS — E11621 Type 2 diabetes mellitus with foot ulcer: Secondary | ICD-10-CM | POA: Diagnosis not present

## 2023-03-08 DIAGNOSIS — E1122 Type 2 diabetes mellitus with diabetic chronic kidney disease: Secondary | ICD-10-CM | POA: Diagnosis not present

## 2023-03-09 ENCOUNTER — Other Ambulatory Visit: Payer: Self-pay | Admitting: Internal Medicine

## 2023-03-09 DIAGNOSIS — I10 Essential (primary) hypertension: Secondary | ICD-10-CM

## 2023-03-11 DIAGNOSIS — L97304 Non-pressure chronic ulcer of unspecified ankle with necrosis of bone: Secondary | ICD-10-CM | POA: Diagnosis not present

## 2023-03-11 DIAGNOSIS — E1169 Type 2 diabetes mellitus with other specified complication: Secondary | ICD-10-CM | POA: Diagnosis not present

## 2023-03-11 DIAGNOSIS — E11622 Type 2 diabetes mellitus with other skin ulcer: Secondary | ICD-10-CM | POA: Diagnosis not present

## 2023-03-11 DIAGNOSIS — M86672 Other chronic osteomyelitis, left ankle and foot: Secondary | ICD-10-CM | POA: Diagnosis not present

## 2023-03-12 ENCOUNTER — Telehealth (HOSPITAL_COMMUNITY): Payer: Self-pay | Admitting: *Deleted

## 2023-03-12 DIAGNOSIS — I130 Hypertensive heart and chronic kidney disease with heart failure and stage 1 through stage 4 chronic kidney disease, or unspecified chronic kidney disease: Secondary | ICD-10-CM | POA: Diagnosis not present

## 2023-03-12 DIAGNOSIS — L97522 Non-pressure chronic ulcer of other part of left foot with fat layer exposed: Secondary | ICD-10-CM | POA: Diagnosis not present

## 2023-03-12 DIAGNOSIS — E11621 Type 2 diabetes mellitus with foot ulcer: Secondary | ICD-10-CM | POA: Diagnosis not present

## 2023-03-12 DIAGNOSIS — N1831 Chronic kidney disease, stage 3a: Secondary | ICD-10-CM | POA: Diagnosis not present

## 2023-03-12 DIAGNOSIS — M86172 Other acute osteomyelitis, left ankle and foot: Secondary | ICD-10-CM | POA: Diagnosis not present

## 2023-03-12 DIAGNOSIS — E1169 Type 2 diabetes mellitus with other specified complication: Secondary | ICD-10-CM | POA: Diagnosis not present

## 2023-03-12 DIAGNOSIS — D631 Anemia in chronic kidney disease: Secondary | ICD-10-CM | POA: Diagnosis not present

## 2023-03-12 DIAGNOSIS — E1122 Type 2 diabetes mellitus with diabetic chronic kidney disease: Secondary | ICD-10-CM | POA: Diagnosis not present

## 2023-03-12 DIAGNOSIS — I5022 Chronic systolic (congestive) heart failure: Secondary | ICD-10-CM | POA: Diagnosis not present

## 2023-03-14 ENCOUNTER — Encounter (HOSPITAL_COMMUNITY): Payer: Self-pay

## 2023-03-15 ENCOUNTER — Encounter (HOSPITAL_COMMUNITY): Payer: Medicare HMO

## 2023-03-15 ENCOUNTER — Telehealth (HOSPITAL_COMMUNITY): Payer: Self-pay | Admitting: Emergency Medicine

## 2023-03-15 NOTE — Telephone Encounter (Signed)
Calling pt about upcoming CMR appt and restrictions with the frestyle libre sensor for DM monitoring.   I left detailed vm advising him that the sensor is NOT allowed in the MR machine and that if he's due to change sensor soon to hold off so not to waste one.   Requested patient call back with further questions.   Rockwell Alexandria RN Navigator Cardiac Imaging [facility] [phone] Office  [phone] Cell

## 2023-03-18 ENCOUNTER — Ambulatory Visit (HOSPITAL_COMMUNITY): Admission: RE | Admit: 2023-03-18 | Payer: Medicare HMO | Source: Ambulatory Visit

## 2023-03-19 DIAGNOSIS — N1831 Chronic kidney disease, stage 3a: Secondary | ICD-10-CM | POA: Diagnosis not present

## 2023-03-19 DIAGNOSIS — D631 Anemia in chronic kidney disease: Secondary | ICD-10-CM | POA: Diagnosis not present

## 2023-03-19 DIAGNOSIS — E11621 Type 2 diabetes mellitus with foot ulcer: Secondary | ICD-10-CM | POA: Diagnosis not present

## 2023-03-19 DIAGNOSIS — E1122 Type 2 diabetes mellitus with diabetic chronic kidney disease: Secondary | ICD-10-CM | POA: Diagnosis not present

## 2023-03-19 DIAGNOSIS — E1169 Type 2 diabetes mellitus with other specified complication: Secondary | ICD-10-CM | POA: Diagnosis not present

## 2023-03-19 DIAGNOSIS — M86172 Other acute osteomyelitis, left ankle and foot: Secondary | ICD-10-CM | POA: Diagnosis not present

## 2023-03-19 DIAGNOSIS — I5022 Chronic systolic (congestive) heart failure: Secondary | ICD-10-CM | POA: Diagnosis not present

## 2023-03-19 DIAGNOSIS — I130 Hypertensive heart and chronic kidney disease with heart failure and stage 1 through stage 4 chronic kidney disease, or unspecified chronic kidney disease: Secondary | ICD-10-CM | POA: Diagnosis not present

## 2023-03-19 DIAGNOSIS — L97522 Non-pressure chronic ulcer of other part of left foot with fat layer exposed: Secondary | ICD-10-CM | POA: Diagnosis not present

## 2023-03-21 ENCOUNTER — Telehealth: Payer: Self-pay | Admitting: *Deleted

## 2023-03-21 DIAGNOSIS — D631 Anemia in chronic kidney disease: Secondary | ICD-10-CM | POA: Diagnosis not present

## 2023-03-21 DIAGNOSIS — E11621 Type 2 diabetes mellitus with foot ulcer: Secondary | ICD-10-CM | POA: Diagnosis not present

## 2023-03-21 DIAGNOSIS — M86172 Other acute osteomyelitis, left ankle and foot: Secondary | ICD-10-CM | POA: Diagnosis not present

## 2023-03-21 DIAGNOSIS — I130 Hypertensive heart and chronic kidney disease with heart failure and stage 1 through stage 4 chronic kidney disease, or unspecified chronic kidney disease: Secondary | ICD-10-CM | POA: Diagnosis not present

## 2023-03-21 DIAGNOSIS — N1831 Chronic kidney disease, stage 3a: Secondary | ICD-10-CM | POA: Diagnosis not present

## 2023-03-21 DIAGNOSIS — E1122 Type 2 diabetes mellitus with diabetic chronic kidney disease: Secondary | ICD-10-CM | POA: Diagnosis not present

## 2023-03-21 DIAGNOSIS — I5022 Chronic systolic (congestive) heart failure: Secondary | ICD-10-CM | POA: Diagnosis not present

## 2023-03-21 DIAGNOSIS — E1169 Type 2 diabetes mellitus with other specified complication: Secondary | ICD-10-CM | POA: Diagnosis not present

## 2023-03-21 DIAGNOSIS — L97522 Non-pressure chronic ulcer of other part of left foot with fat layer exposed: Secondary | ICD-10-CM | POA: Diagnosis not present

## 2023-03-21 NOTE — Telephone Encounter (Signed)
Call from April, RN from [facility].  Needs VO to continue Wound Care for patient 2 times a week for 9 weeks.  VO given.  Will forward to PCP.

## 2023-03-25 ENCOUNTER — Other Ambulatory Visit (HOSPITAL_COMMUNITY): Payer: Self-pay

## 2023-03-27 ENCOUNTER — Other Ambulatory Visit: Payer: Self-pay | Admitting: Internal Medicine

## 2023-03-27 DIAGNOSIS — Z794 Long term (current) use of insulin: Secondary | ICD-10-CM

## 2023-03-27 NOTE — Telephone Encounter (Signed)
Received a call from the pharmacy per pharmacists freestyle libre 3 sensors are no longer available, please send in a rx for freestyle libre 3 sensor plus.

## 2023-03-28 ENCOUNTER — Encounter (HOSPITAL_COMMUNITY): Payer: Medicare HMO

## 2023-03-31 ENCOUNTER — Other Ambulatory Visit: Payer: Self-pay | Admitting: Internal Medicine

## 2023-03-31 DIAGNOSIS — I10 Essential (primary) hypertension: Secondary | ICD-10-CM

## 2023-04-08 DIAGNOSIS — E1122 Type 2 diabetes mellitus with diabetic chronic kidney disease: Secondary | ICD-10-CM | POA: Diagnosis not present

## 2023-04-08 DIAGNOSIS — E11621 Type 2 diabetes mellitus with foot ulcer: Secondary | ICD-10-CM | POA: Diagnosis not present

## 2023-04-08 DIAGNOSIS — N1831 Chronic kidney disease, stage 3a: Secondary | ICD-10-CM | POA: Diagnosis not present

## 2023-04-08 DIAGNOSIS — L97528 Non-pressure chronic ulcer of other part of left foot with other specified severity: Secondary | ICD-10-CM | POA: Diagnosis not present

## 2023-04-08 DIAGNOSIS — I5022 Chronic systolic (congestive) heart failure: Secondary | ICD-10-CM | POA: Diagnosis not present

## 2023-04-08 DIAGNOSIS — D631 Anemia in chronic kidney disease: Secondary | ICD-10-CM | POA: Diagnosis not present

## 2023-04-08 DIAGNOSIS — I130 Hypertensive heart and chronic kidney disease with heart failure and stage 1 through stage 4 chronic kidney disease, or unspecified chronic kidney disease: Secondary | ICD-10-CM | POA: Diagnosis not present

## 2023-04-08 DIAGNOSIS — M86172 Other acute osteomyelitis, left ankle and foot: Secondary | ICD-10-CM | POA: Diagnosis not present

## 2023-04-08 DIAGNOSIS — E1169 Type 2 diabetes mellitus with other specified complication: Secondary | ICD-10-CM | POA: Diagnosis not present

## 2023-04-09 ENCOUNTER — Ambulatory Visit (HOSPITAL_COMMUNITY): Admission: RE | Admit: 2023-04-09 | Payer: Medicare HMO | Source: Ambulatory Visit

## 2023-04-09 ENCOUNTER — Encounter (HOSPITAL_COMMUNITY): Payer: Medicare HMO | Admitting: Cardiology

## 2023-04-10 ENCOUNTER — Other Ambulatory Visit (HOSPITAL_COMMUNITY): Payer: Self-pay

## 2023-04-10 DIAGNOSIS — E1169 Type 2 diabetes mellitus with other specified complication: Secondary | ICD-10-CM | POA: Diagnosis not present

## 2023-04-10 DIAGNOSIS — E1122 Type 2 diabetes mellitus with diabetic chronic kidney disease: Secondary | ICD-10-CM | POA: Diagnosis not present

## 2023-04-10 DIAGNOSIS — E11621 Type 2 diabetes mellitus with foot ulcer: Secondary | ICD-10-CM | POA: Diagnosis not present

## 2023-04-10 DIAGNOSIS — M86172 Other acute osteomyelitis, left ankle and foot: Secondary | ICD-10-CM | POA: Diagnosis not present

## 2023-04-10 DIAGNOSIS — L97528 Non-pressure chronic ulcer of other part of left foot with other specified severity: Secondary | ICD-10-CM | POA: Diagnosis not present

## 2023-04-10 DIAGNOSIS — I130 Hypertensive heart and chronic kidney disease with heart failure and stage 1 through stage 4 chronic kidney disease, or unspecified chronic kidney disease: Secondary | ICD-10-CM | POA: Diagnosis not present

## 2023-04-10 DIAGNOSIS — D631 Anemia in chronic kidney disease: Secondary | ICD-10-CM | POA: Diagnosis not present

## 2023-04-10 DIAGNOSIS — N1831 Chronic kidney disease, stage 3a: Secondary | ICD-10-CM | POA: Diagnosis not present

## 2023-04-10 DIAGNOSIS — I5022 Chronic systolic (congestive) heart failure: Secondary | ICD-10-CM | POA: Diagnosis not present

## 2023-04-15 DIAGNOSIS — I130 Hypertensive heart and chronic kidney disease with heart failure and stage 1 through stage 4 chronic kidney disease, or unspecified chronic kidney disease: Secondary | ICD-10-CM | POA: Diagnosis not present

## 2023-04-15 DIAGNOSIS — E1169 Type 2 diabetes mellitus with other specified complication: Secondary | ICD-10-CM | POA: Diagnosis not present

## 2023-04-15 DIAGNOSIS — D631 Anemia in chronic kidney disease: Secondary | ICD-10-CM | POA: Diagnosis not present

## 2023-04-15 DIAGNOSIS — E11621 Type 2 diabetes mellitus with foot ulcer: Secondary | ICD-10-CM | POA: Diagnosis not present

## 2023-04-15 DIAGNOSIS — M86172 Other acute osteomyelitis, left ankle and foot: Secondary | ICD-10-CM | POA: Diagnosis not present

## 2023-04-15 DIAGNOSIS — E1122 Type 2 diabetes mellitus with diabetic chronic kidney disease: Secondary | ICD-10-CM | POA: Diagnosis not present

## 2023-04-15 DIAGNOSIS — N1831 Chronic kidney disease, stage 3a: Secondary | ICD-10-CM | POA: Diagnosis not present

## 2023-04-15 DIAGNOSIS — L97528 Non-pressure chronic ulcer of other part of left foot with other specified severity: Secondary | ICD-10-CM | POA: Diagnosis not present

## 2023-04-15 DIAGNOSIS — I5022 Chronic systolic (congestive) heart failure: Secondary | ICD-10-CM | POA: Diagnosis not present

## 2023-04-17 DIAGNOSIS — E113511 Type 2 diabetes mellitus with proliferative diabetic retinopathy with macular edema, right eye: Secondary | ICD-10-CM | POA: Diagnosis not present

## 2023-04-29 ENCOUNTER — Ambulatory Visit (HOSPITAL_COMMUNITY): Admission: RE | Admit: 2023-04-29 | Payer: Medicare HMO | Source: Ambulatory Visit

## 2023-05-05 ENCOUNTER — Other Ambulatory Visit: Payer: Self-pay | Admitting: Internal Medicine

## 2023-05-05 DIAGNOSIS — E1159 Type 2 diabetes mellitus with other circulatory complications: Secondary | ICD-10-CM

## 2023-05-07 ENCOUNTER — Other Ambulatory Visit: Payer: Self-pay | Admitting: Internal Medicine

## 2023-05-07 DIAGNOSIS — E785 Hyperlipidemia, unspecified: Secondary | ICD-10-CM

## 2023-05-09 ENCOUNTER — Other Ambulatory Visit (HOSPITAL_COMMUNITY): Payer: Medicare HMO

## 2023-05-09 ENCOUNTER — Encounter (HOSPITAL_COMMUNITY): Payer: Medicare HMO | Admitting: Cardiology

## 2023-05-14 ENCOUNTER — Telehealth (HOSPITAL_COMMUNITY): Payer: Self-pay

## 2023-05-15 ENCOUNTER — Encounter: Payer: Medicare HMO | Admitting: Internal Medicine

## 2023-05-17 ENCOUNTER — Ambulatory Visit (HOSPITAL_COMMUNITY): Payer: Medicare HMO

## 2023-05-17 DIAGNOSIS — L97429 Non-pressure chronic ulcer of left heel and midfoot with unspecified severity: Secondary | ICD-10-CM | POA: Diagnosis not present

## 2023-05-17 DIAGNOSIS — G8918 Other acute postprocedural pain: Secondary | ICD-10-CM | POA: Diagnosis not present

## 2023-05-17 DIAGNOSIS — N183 Chronic kidney disease, stage 3 unspecified: Secondary | ICD-10-CM | POA: Diagnosis not present

## 2023-05-17 DIAGNOSIS — E08622 Diabetes mellitus due to underlying condition with other skin ulcer: Secondary | ICD-10-CM | POA: Diagnosis not present

## 2023-05-17 DIAGNOSIS — M86672 Other chronic osteomyelitis, left ankle and foot: Secondary | ICD-10-CM | POA: Diagnosis not present

## 2023-05-17 DIAGNOSIS — E11622 Type 2 diabetes mellitus with other skin ulcer: Secondary | ICD-10-CM | POA: Diagnosis not present

## 2023-05-17 DIAGNOSIS — I130 Hypertensive heart and chronic kidney disease with heart failure and stage 1 through stage 4 chronic kidney disease, or unspecified chronic kidney disease: Secondary | ICD-10-CM | POA: Diagnosis not present

## 2023-05-17 DIAGNOSIS — I5020 Unspecified systolic (congestive) heart failure: Secondary | ICD-10-CM | POA: Diagnosis not present

## 2023-05-17 DIAGNOSIS — L97324 Non-pressure chronic ulcer of left ankle with necrosis of bone: Secondary | ICD-10-CM | POA: Diagnosis not present

## 2023-05-17 DIAGNOSIS — E1122 Type 2 diabetes mellitus with diabetic chronic kidney disease: Secondary | ICD-10-CM | POA: Diagnosis not present

## 2023-05-17 DIAGNOSIS — E11621 Type 2 diabetes mellitus with foot ulcer: Secondary | ICD-10-CM | POA: Diagnosis not present

## 2023-05-17 DIAGNOSIS — L97421 Non-pressure chronic ulcer of left heel and midfoot limited to breakdown of skin: Secondary | ICD-10-CM | POA: Diagnosis not present

## 2023-05-20 DIAGNOSIS — I5022 Chronic systolic (congestive) heart failure: Secondary | ICD-10-CM | POA: Diagnosis not present

## 2023-05-20 DIAGNOSIS — D631 Anemia in chronic kidney disease: Secondary | ICD-10-CM | POA: Diagnosis not present

## 2023-05-20 DIAGNOSIS — E1169 Type 2 diabetes mellitus with other specified complication: Secondary | ICD-10-CM | POA: Diagnosis not present

## 2023-05-20 DIAGNOSIS — L97528 Non-pressure chronic ulcer of other part of left foot with other specified severity: Secondary | ICD-10-CM | POA: Diagnosis not present

## 2023-05-20 DIAGNOSIS — E11621 Type 2 diabetes mellitus with foot ulcer: Secondary | ICD-10-CM | POA: Diagnosis not present

## 2023-05-20 DIAGNOSIS — N1831 Chronic kidney disease, stage 3a: Secondary | ICD-10-CM | POA: Diagnosis not present

## 2023-05-20 DIAGNOSIS — M86172 Other acute osteomyelitis, left ankle and foot: Secondary | ICD-10-CM | POA: Diagnosis not present

## 2023-05-20 DIAGNOSIS — E1122 Type 2 diabetes mellitus with diabetic chronic kidney disease: Secondary | ICD-10-CM | POA: Diagnosis not present

## 2023-05-20 DIAGNOSIS — I130 Hypertensive heart and chronic kidney disease with heart failure and stage 1 through stage 4 chronic kidney disease, or unspecified chronic kidney disease: Secondary | ICD-10-CM | POA: Diagnosis not present

## 2023-05-21 ENCOUNTER — Encounter (HOSPITAL_COMMUNITY): Payer: Self-pay

## 2023-05-23 ENCOUNTER — Other Ambulatory Visit (HOSPITAL_COMMUNITY): Payer: Self-pay | Admitting: Cardiology

## 2023-05-23 ENCOUNTER — Ambulatory Visit (HOSPITAL_COMMUNITY)
Admission: RE | Admit: 2023-05-23 | Discharge: 2023-05-23 | Disposition: A | Discharge status: Home / Self Care | Payer: Medicare HMO | Source: Ambulatory Visit | Attending: Cardiology | Admitting: Cardiology

## 2023-05-23 DIAGNOSIS — I5022 Chronic systolic (congestive) heart failure: Secondary | ICD-10-CM | POA: Insufficient documentation

## 2023-05-23 MED ORDER — GADOBUTROL 1 MMOL/ML IV SOLN
10.0000 mL | Freq: Once | INTRAVENOUS | Status: AC | PRN
  Administered 2023-05-23: 10 mL via INTRAVENOUS

## 2023-05-27 ENCOUNTER — Telehealth (HOSPITAL_COMMUNITY): Payer: Self-pay

## 2023-05-27 NOTE — Telephone Encounter (Addendum)
Pt aware, agreeable, and verbalized understanding   ----- Message from Dorthula Nettles sent at 05/27/2023 10:40 AM EST ----- Can we please let Steven Cochran know that he does not have amyloid. His heart function has continued to improve!  Adi

## 2023-05-29 ENCOUNTER — Encounter: Payer: Medicare HMO | Admitting: Student

## 2023-05-30 ENCOUNTER — Other Ambulatory Visit: Payer: Self-pay | Admitting: Internal Medicine

## 2023-05-30 ENCOUNTER — Ambulatory Visit: Payer: Medicare HMO | Admitting: Student

## 2023-05-30 VITALS — BP 133/77 | HR 71 | Temp 98.1°F | Ht 74.0 in | Wt 262.4 lb

## 2023-05-30 DIAGNOSIS — E1169 Type 2 diabetes mellitus with other specified complication: Secondary | ICD-10-CM | POA: Diagnosis not present

## 2023-05-30 DIAGNOSIS — I130 Hypertensive heart and chronic kidney disease with heart failure and stage 1 through stage 4 chronic kidney disease, or unspecified chronic kidney disease: Secondary | ICD-10-CM | POA: Diagnosis not present

## 2023-05-30 DIAGNOSIS — I5022 Chronic systolic (congestive) heart failure: Secondary | ICD-10-CM | POA: Diagnosis not present

## 2023-05-30 DIAGNOSIS — I5020 Unspecified systolic (congestive) heart failure: Secondary | ICD-10-CM

## 2023-05-30 DIAGNOSIS — Z794 Long term (current) use of insulin: Secondary | ICD-10-CM

## 2023-05-30 DIAGNOSIS — N1831 Chronic kidney disease, stage 3a: Secondary | ICD-10-CM | POA: Diagnosis not present

## 2023-05-30 DIAGNOSIS — L97528 Non-pressure chronic ulcer of other part of left foot with other specified severity: Secondary | ICD-10-CM | POA: Diagnosis not present

## 2023-05-30 DIAGNOSIS — E1149 Type 2 diabetes mellitus with other diabetic neurological complication: Secondary | ICD-10-CM

## 2023-05-30 DIAGNOSIS — D631 Anemia in chronic kidney disease: Secondary | ICD-10-CM | POA: Diagnosis not present

## 2023-05-30 DIAGNOSIS — E1122 Type 2 diabetes mellitus with diabetic chronic kidney disease: Secondary | ICD-10-CM | POA: Diagnosis not present

## 2023-05-30 DIAGNOSIS — I10 Essential (primary) hypertension: Secondary | ICD-10-CM

## 2023-05-30 DIAGNOSIS — I110 Hypertensive heart disease with heart failure: Secondary | ICD-10-CM

## 2023-05-30 DIAGNOSIS — E11621 Type 2 diabetes mellitus with foot ulcer: Secondary | ICD-10-CM | POA: Diagnosis not present

## 2023-05-30 DIAGNOSIS — M86172 Other acute osteomyelitis, left ankle and foot: Secondary | ICD-10-CM | POA: Diagnosis not present

## 2023-05-30 LAB — GLUCOSE, CAPILLARY: Glucose-Capillary: 202 mg/dL — ABNORMAL HIGH (ref 70–99)

## 2023-05-30 LAB — POCT GLYCOSYLATED HEMOGLOBIN (HGB A1C): Hemoglobin A1C: 7.4 % — AB (ref 4.0–5.6)

## 2023-05-30 MED ORDER — LANTUS SOLOSTAR 100 UNIT/ML ~~LOC~~ SOPN: 15.0000 [IU] | PEN_INJECTOR | Freq: Every day | SUBCUTANEOUS | 3 refills | Status: DC

## 2023-05-30 NOTE — Progress Notes (Signed)
Established Patient Office Visit  Subjective   Patient ID: Steven Cochran, male    DOB: Aug 18, 1970  Age: 53 y.o. MRN: [id number]  Chief Complaint  Patient presents with   Follow-up    Follow up dm / medication refill    Steven Cochran is a 53 y.o. who presents to the clinic for a follow up of T2DM and HTN. Please see problem based assessment and plan for additional details.  Patient Active Problem List   Diagnosis Date Noted   Acute renal failure superimposed on stage 2 chronic kidney disease (HCC) 01/21/2023   History of stroke 01/21/2023   Primary open angle glaucoma 01/15/2023   Essential hypertension 09/27/2022   Acute osteomyelitis of left foot (HCC) 09/18/2022   Retinopathy due to secondary diabetes mellitus (HCC) 08/29/2022   Preventive measure 07/15/2022   Hyperlipidemia associated with type 2 diabetes mellitus (HCC) 07/15/2022   Anemia of chronic disease 07/15/2022   Diabetic ulcer of left foot associated with type 2 diabetes mellitus (HCC) 06/14/2022   Embolic stroke (HCC) 09/02/2021   Type II diabetes mellitus with neurological manifestations (HCC) 09/02/2021   Chronic systolic CHF (congestive heart failure) (HCC) 09/02/2021   HTN (hypertension) 06/30/2012      Objective:     BP 133/77 (BP Location: Right Arm, Patient Position: Sitting, Cuff Size: Large)   Pulse 71   Temp 98.1 F (36.7 C) (Oral)   Ht 6\' 2"  (1.88 m)   Wt 262 lb 6.4 oz (119 kg)   SpO2 100%   BMI 33.69 kg/m  BP Readings from Last 3 Encounters:  05/30/23 133/77  02/07/23 138/71  01/23/23 128/84   Wt Readings from Last 3 Encounters:  05/30/23 262 lb 6.4 oz (119 kg)  02/07/23 257 lb 8 oz (116.8 kg)  01/22/23 269 lb 1.6 oz (122.1 kg)      Physical Exam Vitals reviewed.  Constitutional:      General: He is not in acute distress.    Appearance: He is obese. He is not ill-appearing, toxic-appearing or diaphoretic.  Cardiovascular:     Rate and Rhythm: Normal rate and regular rhythm.   Pulmonary:     Effort: Pulmonary effort is normal. No respiratory distress.     Breath sounds: No stridor. No wheezing or rhonchi.  Musculoskeletal:        General: Signs of injury (LLE has a wound present that was wrapped in clean dry dressing) present.     Right lower leg: No edema.     Left lower leg: No edema.  Skin:    General: Skin is warm and dry.  Neurological:     Mental Status: He is alert.      Results for orders placed or performed in visit on 05/30/23  Glucose, capillary  Result Value Ref Range   Glucose-Capillary 202 (H) 70 - 99 mg/dL  POC Hbg K4M  Result Value Ref Range   Hemoglobin A1C 7.4 (A) 4.0 - 5.6 %   HbA1c POC (<> result, manual entry)     HbA1c, POC (prediabetic range)     HbA1c, POC (controlled diabetic range)      Last CBC Lab Results  Component Value Date   WBC 6.4 02/07/2023   HGB 9.3 (L) 02/07/2023   HCT 32.1 (L) 02/07/2023   MCV 76 (L) 02/07/2023   MCH 22.0 (L) 02/07/2023   RDW 14.6 02/07/2023   PLT 463 (H) 02/07/2023   Last metabolic panel Lab Results  Component Value Date  GLUCOSE 197 (H) 02/07/2023   NA 140 02/07/2023   K 5.2 02/07/2023   CL 103 02/07/2023   CO2 24 02/07/2023   BUN 18 02/07/2023   CREATININE 1.29 (H) 02/07/2023   EGFR 67 02/07/2023   CALCIUM 9.4 02/07/2023   PHOS 3.3 06/15/2022   PROT 7.7 01/21/2023   ALBUMIN 3.9 01/21/2023   BILITOT 0.7 01/21/2023   ALKPHOS 83 01/21/2023   AST 23 01/21/2023   ALT 26 01/21/2023   ANIONGAP 10 01/23/2023   Last hemoglobin A1c Lab Results  Component Value Date   HGBA1C 7.4 (A) 05/30/2023      The ASCVD Risk score (Arnett DK, et al., 2019) failed to calculate for the following reasons:   Risk score cannot be calculated because patient has a medical history suggesting prior/existing ASCVD    Assessment & Plan:   Problem List Items Addressed This Visit       Cardiovascular and Mediastinum   HTN (hypertension) (Chronic)   Patient presents with well-controlled  hypertension with a blood pressure of 133/77.  He is on a current regimen of amlodipine 5 mg and carvedilol 25 mg twice daily.  He is also taking Entresto 97-103 mg twice daily for HFrEF. Plan -BMP ordered for today -Will continue current regimen of amlodipine 5 mg and carvedilol 25 mg twice daily      Relevant Medications   ENTRESTO 97-103 MG   Essential hypertension   Relevant Medications   ENTRESTO 97-103 MG   insulin glargine (LANTUS SOLOSTAR) 100 UNIT/ML Solostar Pen   Other Relevant Orders   BMP8+Anion Gap     Endocrine   Type II diabetes mellitus with neurological manifestations (HCC) - Primary   Patient presents to the clinic with type 2 diabetes.  Patient's A1c improved to 7.4 today.  He is on a current regimen of Lantus 15 units daily and Jardiance 25mg  daily.  He was taken off of metformin in the past due to diarrhea and prefers not to be on Ozempic due to concerns of damage to vision.  Patient is motivated and would like to get back into exercising to bring down his glucose as well.  He reported fasting blood glucose levels of 120 in the morning and denies issues with his current regimen such as hypoglycemia. Plan: -Will continue current regimen of Lantus 15 units daily and Jardiance 25 mg daily -Patient was encouraged to monitor for hypoglycemia and to let us know if he has blood glucose levels less than 70       Relevant Medications   insulin glargine (LANTUS SOLOSTAR) 100 UNIT/ML Solostar Pen   Other Relevant Orders   POC Hbg A1C (Completed)   BMP8+Anion Gap     Return in about 3 months (around 08/28/2023) for DM and HTN.    Steven Rogue, DO

## 2023-05-30 NOTE — Patient Instructions (Addendum)
Thank you, [name] for allowing [place] to provide your care today. Today we discussed diabetes and HTN.    I have ordered the following labs for you:   Lab Orders         Glucose, capillary         POC Hbg A1C        Follow up: 3 months    Remember: To continue to keep an eye out for low blood glucose levels. Please call us if you consistently have low blood glucose levels (less than 70). Continue your current medications for diabetes and high blood pressure.   Should you have any questions or concerns please call the internal medicine clinic at [phone].     Please note that our late policy has changed.  If you are more than 15 minutes late to your appointment, you may be asked to reschedule your appointment.  Dr. Hessie Diener, D.O. [facility]

## 2023-05-30 NOTE — Assessment & Plan Note (Signed)
Patient presents to the clinic with type 2 diabetes.  Patient's A1c improved to 7.4 today.  He is on a current regimen of Lantus 15 units daily and Jardiance 25mg  daily.  He was taken off of metformin in the past due to diarrhea and prefers not to be on Ozempic due to concerns of damage to vision.  Patient is motivated and would like to get back into exercising to bring down his glucose as well.  He reported fasting blood glucose levels of 120 in the morning and denies issues with his current regimen such as hypoglycemia. Plan: -Will continue current regimen of Lantus 15 units daily and Jardiance 25 mg daily -Patient was encouraged to monitor for hypoglycemia and to let us know if he has blood glucose levels less than 70

## 2023-05-30 NOTE — Assessment & Plan Note (Addendum)
Patient presents with well-controlled hypertension with a blood pressure of 133/77.  He is on a current regimen of amlodipine 5 mg and carvedilol 25 mg twice daily.  He is also taking Entresto 97-103 mg twice daily for HFrEF. Plan -BMP ordered for today -Will continue current regimen of amlodipine 5 mg and carvedilol 25 mg twice daily

## 2023-05-31 LAB — BMP8+ANION GAP
Anion Gap: 13 mmol/L (ref 10.0–18.0)
BUN/Creatinine Ratio: 19 (ref 9–20)
BUN: 23 mg/dL (ref 6–24)
CO2: 21 mmol/L (ref 20–29)
Calcium: 8.9 mg/dL (ref 8.7–10.2)
Chloride: 102 mmol/L (ref 96–106)
Creatinine, Ser: 1.19 mg/dL (ref 0.76–1.27)
Glucose: 203 mg/dL — ABNORMAL HIGH (ref 70–99)
Potassium: 4.8 mmol/L (ref 3.5–5.2)
Sodium: 136 mmol/L (ref 134–144)
eGFR: 74 mL/min/{1.73_m2} (ref >59)

## 2023-05-31 NOTE — Progress Notes (Signed)
Internal Medicine Clinic Attending  Case discussed with the resident at the time of the visit.  We reviewed the resident's history and exam and pertinent patient test results.  I agree with the assessment, diagnosis, and plan of care documented in the resident's note.

## 2023-06-05 DIAGNOSIS — E1122 Type 2 diabetes mellitus with diabetic chronic kidney disease: Secondary | ICD-10-CM | POA: Diagnosis not present

## 2023-06-05 DIAGNOSIS — N1831 Chronic kidney disease, stage 3a: Secondary | ICD-10-CM | POA: Diagnosis not present

## 2023-06-05 DIAGNOSIS — I5022 Chronic systolic (congestive) heart failure: Secondary | ICD-10-CM | POA: Diagnosis not present

## 2023-06-05 DIAGNOSIS — M86172 Other acute osteomyelitis, left ankle and foot: Secondary | ICD-10-CM | POA: Diagnosis not present

## 2023-06-05 DIAGNOSIS — L97528 Non-pressure chronic ulcer of other part of left foot with other specified severity: Secondary | ICD-10-CM | POA: Diagnosis not present

## 2023-06-05 DIAGNOSIS — I130 Hypertensive heart and chronic kidney disease with heart failure and stage 1 through stage 4 chronic kidney disease, or unspecified chronic kidney disease: Secondary | ICD-10-CM | POA: Diagnosis not present

## 2023-06-05 DIAGNOSIS — E1169 Type 2 diabetes mellitus with other specified complication: Secondary | ICD-10-CM | POA: Diagnosis not present

## 2023-06-05 DIAGNOSIS — E11621 Type 2 diabetes mellitus with foot ulcer: Secondary | ICD-10-CM | POA: Diagnosis not present

## 2023-06-05 DIAGNOSIS — D631 Anemia in chronic kidney disease: Secondary | ICD-10-CM | POA: Diagnosis not present

## 2023-06-06 DIAGNOSIS — Z794 Long term (current) use of insulin: Secondary | ICD-10-CM | POA: Diagnosis not present

## 2023-06-06 DIAGNOSIS — E11621 Type 2 diabetes mellitus with foot ulcer: Secondary | ICD-10-CM | POA: Diagnosis not present

## 2023-06-06 DIAGNOSIS — L97304 Non-pressure chronic ulcer of unspecified ankle with necrosis of bone: Secondary | ICD-10-CM | POA: Diagnosis not present

## 2023-06-11 DIAGNOSIS — E113511 Type 2 diabetes mellitus with proliferative diabetic retinopathy with macular edema, right eye: Secondary | ICD-10-CM | POA: Diagnosis not present

## 2023-06-11 LAB — HM DIABETES EYE EXAM

## 2023-06-12 ENCOUNTER — Other Ambulatory Visit: Payer: Self-pay | Admitting: Internal Medicine

## 2023-06-12 DIAGNOSIS — E785 Hyperlipidemia, unspecified: Secondary | ICD-10-CM

## 2023-06-12 NOTE — Telephone Encounter (Signed)
 Medication sent to pharmacy

## 2023-06-24 ENCOUNTER — Telehealth: Payer: Self-pay | Admitting: Internal Medicine

## 2023-06-24 ENCOUNTER — Other Ambulatory Visit: Payer: Self-pay | Admitting: Internal Medicine

## 2023-06-24 DIAGNOSIS — E1159 Type 2 diabetes mellitus with other circulatory complications: Secondary | ICD-10-CM

## 2023-06-24 DIAGNOSIS — E785 Hyperlipidemia, unspecified: Secondary | ICD-10-CM

## 2023-06-24 DIAGNOSIS — Z794 Long term (current) use of insulin: Secondary | ICD-10-CM

## 2023-06-24 MED ORDER — ROSUVASTATIN CALCIUM 40 MG PO TABS: 40.0000 mg | ORAL_TABLET | Freq: Every day | ORAL | 0 refills | Status: AC

## 2023-06-24 NOTE — Telephone Encounter (Signed)
 Per [facility]  The pt is requesting a 90 day refill rather than the 30 day refill that was sent in .  Pt has not picked up his medication that was called in.  Please contact the number listed below if any questions.  [facility] [phone]   Please call the refill in to his local pharmacy listed below  rosuvastatin (CRESTOR) 40 MG tablet [id number] - [place], [place] - [address]

## 2023-07-21 ENCOUNTER — Other Ambulatory Visit: Payer: Self-pay | Admitting: Internal Medicine

## 2023-07-21 DIAGNOSIS — E1159 Type 2 diabetes mellitus with other circulatory complications: Secondary | ICD-10-CM

## 2023-07-22 ENCOUNTER — Other Ambulatory Visit: Payer: Self-pay | Admitting: Student

## 2023-07-22 NOTE — Telephone Encounter (Deleted)
 Copied from CRM [id number]. Topic: Clinical - Medication Refill >> Jul 22, 2023  3:13 PM Antony Haste wrote: Most Recent Primary Care Visit:  Provider: Faith Rogue  Department: [facility]  Visit Type: OPEN ESTABLISHED  Date: 05/30/2023  Medication: Continuous Glucose Sensor (FREESTYLE LIBRE 3 SENSOR) MISC [[id number]]  Has the patient contacted their pharmacy? No (Agent: If no, request that the patient contact the pharmacy for the refill. If patient does not wish to contact the pharmacy document the reason why and proceed with request.) (Agent: If yes, when and what did the pharmacy advise?)  Is this the correct pharmacy for this prescription? No If no, delete pharmacy and type the correct one.  This is the patient's preferred pharmacy:  [facility] [address], [place] - [address] [place] [address] Phone: [phone] Fax: [phone]  [facility] - [place], [place] - [address] [place] [address] Phone: [phone] Fax: [phone]   Has the prescription been filled recently? No  Is the patient out of the medication? Yes  Has the patient been seen for an appointment in the last year OR does the patient have an upcoming appointment? Yes  Can we respond through MyChart? Yes  Agent: Please be advised that Rx refills may take up to 3 business days. We ask that you follow-up with your pharmacy.

## 2023-07-23 ENCOUNTER — Telehealth: Payer: Self-pay

## 2023-07-23 ENCOUNTER — Other Ambulatory Visit: Payer: Self-pay

## 2023-07-23 ENCOUNTER — Other Ambulatory Visit: Payer: Self-pay | Admitting: Internal Medicine

## 2023-07-23 DIAGNOSIS — E1159 Type 2 diabetes mellitus with other circulatory complications: Secondary | ICD-10-CM

## 2023-07-23 MED ORDER — FREESTYLE LIBRE 3 SENSOR MISC: 0 refills | Status: DC

## 2023-07-23 NOTE — Telephone Encounter (Signed)
 I will call the pharmacy.Marland Kitchen they may have sent over the wrong information for the wrong patient.

## 2023-07-23 NOTE — Telephone Encounter (Signed)
 Received a fax from the pharmacy regarding medication nicotine patch, per pharmacy the drug is not covered, part d exclusion otc's not covered. Not covered by insurance.

## 2023-07-23 NOTE — Telephone Encounter (Signed)
Refill request has already been addressed.

## 2023-07-23 NOTE — Telephone Encounter (Addendum)
 Yes he can, I called the patient I was not able to reach him so I left him a voicemail.

## 2023-07-23 NOTE — Telephone Encounter (Signed)
 Copied from CRM [id number]. Topic: Clinical - Medication Question >> Jul 23, 2023  2:40 PM Antony Haste wrote: Reason for CRM: The patient states he received a missed call from [facility] regarding nicotine patches. He is unsure why he received a call about this, he states he never taken nicotine before. Advised his Josephine Igo has been sent over to [facility] as of today 03/18 and to follow-up with his pharmacy directly.  Callback [phone]

## 2023-08-06 DIAGNOSIS — Z833 Family history of diabetes mellitus: Secondary | ICD-10-CM | POA: Diagnosis not present

## 2023-08-06 DIAGNOSIS — Z7984 Long term (current) use of oral hypoglycemic drugs: Secondary | ICD-10-CM | POA: Diagnosis not present

## 2023-08-06 DIAGNOSIS — E1151 Type 2 diabetes mellitus with diabetic peripheral angiopathy without gangrene: Secondary | ICD-10-CM | POA: Diagnosis not present

## 2023-08-06 DIAGNOSIS — I509 Heart failure, unspecified: Secondary | ICD-10-CM | POA: Diagnosis not present

## 2023-08-06 DIAGNOSIS — E1169 Type 2 diabetes mellitus with other specified complication: Secondary | ICD-10-CM | POA: Diagnosis not present

## 2023-08-06 DIAGNOSIS — Z7982 Long term (current) use of aspirin: Secondary | ICD-10-CM | POA: Diagnosis not present

## 2023-08-06 DIAGNOSIS — Z5948 Other specified lack of adequate food: Secondary | ICD-10-CM | POA: Diagnosis not present

## 2023-08-06 DIAGNOSIS — Z945 Skin transplant status: Secondary | ICD-10-CM | POA: Diagnosis not present

## 2023-08-06 DIAGNOSIS — Z5941 Food insecurity: Secondary | ICD-10-CM | POA: Diagnosis not present

## 2023-08-06 DIAGNOSIS — M869 Osteomyelitis, unspecified: Secondary | ICD-10-CM | POA: Diagnosis not present

## 2023-08-06 DIAGNOSIS — I700 Atherosclerosis of aorta: Secondary | ICD-10-CM | POA: Diagnosis not present

## 2023-08-06 DIAGNOSIS — I130 Hypertensive heart and chronic kidney disease with heart failure and stage 1 through stage 4 chronic kidney disease, or unspecified chronic kidney disease: Secondary | ICD-10-CM | POA: Diagnosis not present

## 2023-08-06 DIAGNOSIS — N189 Chronic kidney disease, unspecified: Secondary | ICD-10-CM | POA: Diagnosis not present

## 2023-08-06 DIAGNOSIS — E669 Obesity, unspecified: Secondary | ICD-10-CM | POA: Diagnosis not present

## 2023-08-06 DIAGNOSIS — N529 Male erectile dysfunction, unspecified: Secondary | ICD-10-CM | POA: Diagnosis not present

## 2023-08-06 DIAGNOSIS — Z9181 History of falling: Secondary | ICD-10-CM | POA: Diagnosis not present

## 2023-08-06 DIAGNOSIS — E1122 Type 2 diabetes mellitus with diabetic chronic kidney disease: Secondary | ICD-10-CM | POA: Diagnosis not present

## 2023-08-06 DIAGNOSIS — R32 Unspecified urinary incontinence: Secondary | ICD-10-CM | POA: Diagnosis not present

## 2023-08-06 DIAGNOSIS — Z89422 Acquired absence of other left toe(s): Secondary | ICD-10-CM | POA: Diagnosis not present

## 2023-08-06 DIAGNOSIS — F321 Major depressive disorder, single episode, moderate: Secondary | ICD-10-CM | POA: Diagnosis not present

## 2023-08-06 DIAGNOSIS — E113519 Type 2 diabetes mellitus with proliferative diabetic retinopathy with macular edema, unspecified eye: Secondary | ICD-10-CM | POA: Diagnosis not present

## 2023-08-06 DIAGNOSIS — T8789 Other complications of amputation stump: Secondary | ICD-10-CM | POA: Diagnosis not present

## 2023-08-06 DIAGNOSIS — E1136 Type 2 diabetes mellitus with diabetic cataract: Secondary | ICD-10-CM | POA: Diagnosis not present

## 2023-08-13 ENCOUNTER — Ambulatory Visit: Payer: Medicare HMO | Admitting: Student

## 2023-08-13 VITALS — BP 133/79 | HR 63 | Temp 97.7°F | Ht 74.0 in | Wt 261.1 lb

## 2023-08-13 DIAGNOSIS — Z7984 Long term (current) use of oral hypoglycemic drugs: Secondary | ICD-10-CM

## 2023-08-13 DIAGNOSIS — E1149 Type 2 diabetes mellitus with other diabetic neurological complication: Secondary | ICD-10-CM

## 2023-08-13 DIAGNOSIS — Z794 Long term (current) use of insulin: Secondary | ICD-10-CM | POA: Diagnosis not present

## 2023-08-13 DIAGNOSIS — I10 Essential (primary) hypertension: Secondary | ICD-10-CM

## 2023-08-13 LAB — POCT GLYCOSYLATED HEMOGLOBIN (HGB A1C): Hemoglobin A1C: 7.3 % — AB (ref 4.0–5.6)

## 2023-08-13 LAB — GLUCOSE, CAPILLARY: Glucose-Capillary: 169 mg/dL — ABNORMAL HIGH (ref 70–99)

## 2023-08-13 MED ORDER — PEN NEEDLES 32G X 4 MM MISC: 1.0000 | Freq: Two times a day (BID) | 1 refills | Status: DC

## 2023-08-13 MED ORDER — FREESTYLE LIBRE 3 SENSOR MISC: 1.0000 | 2 refills | Status: DC

## 2023-08-13 NOTE — Patient Instructions (Signed)
 Thank you, Mr.Keyon Kalis for allowing [place] to provide your care today. Today we discussed diabetes. We will have you use 10 units of lantus every morning with breakfast.  -IF you eat dinner, please administer an additional 5 units with dinner.  -If you are skipping dinner and your blood glucose is greater than 250, you may administer 5 units.  -If you are skipping dinner and your blood glucose is less than 100, do not administer the 5 units   I have ordered the following labs for you:   Lab Orders         Glucose, capillary         Microalbumin / Creatinine Urine Ratio         POC Hbg A1C        Follow up: 3 months    Remember: to try to eat dinner every night!   Should you have any questions or concerns please call the internal medicine clinic at [phone].     Please note that our late policy has changed.  If you are more than 15 minutes late to your appointment, you may be asked to reschedule your appointment.  Dr. Hessie Diener, D.O. [facility]

## 2023-08-13 NOTE — Progress Notes (Unsigned)
 Established Patient Office Visit  Subjective   Patient ID: Steven Cochran, male    DOB: 08/30/1970  Age: 53 y.o. MRN: [id number]  No chief complaint on file.   Steven Cochran is a 53 y.o. who presents to the clinic for a T2DM follow up. Please see problem based assessment and plan for additional details.  Patient Active Problem List   Diagnosis Date Noted   Acute renal failure superimposed on stage 2 chronic kidney disease (HCC) 01/21/2023   History of stroke 01/21/2023   Primary open angle glaucoma 01/15/2023   Essential hypertension 09/27/2022   Acute osteomyelitis of left foot (HCC) 09/18/2022   Retinopathy due to secondary diabetes mellitus (HCC) 08/29/2022   Preventive measure 07/15/2022   Hyperlipidemia associated with type 2 diabetes mellitus (HCC) 07/15/2022   Anemia of chronic disease 07/15/2022   Diabetic ulcer of left foot associated with type 2 diabetes mellitus (HCC) 06/14/2022   Embolic stroke (HCC) 09/02/2021   Type II diabetes mellitus with neurological manifestations (HCC) 09/02/2021   Chronic systolic CHF (congestive heart failure) (HCC) 09/02/2021   HTN (hypertension) 06/30/2012      Objective:     BP 133/79 (BP Location: Left Arm, Patient Position: Sitting, Cuff Size: Small)   Pulse 63   Temp 97.7 F (36.5 C) (Oral)   Ht 6\' 2"  (1.88 m)   Wt 261 lb 1.6 oz (118.4 kg)   SpO2 100%   BMI 33.52 kg/m  BP Readings from Last 3 Encounters:  08/13/23 133/79  05/30/23 133/77  02/07/23 138/71      Physical Exam Vitals reviewed.  Constitutional:      General: He is not in acute distress.    Appearance: He is not ill-appearing or toxic-appearing.  Cardiovascular:     Rate and Rhythm: Normal rate and regular rhythm.     Heart sounds: Normal heart sounds. No murmur heard. Pulmonary:     Effort: Pulmonary effort is normal. No respiratory distress.     Breath sounds: Normal breath sounds.  Skin:    General: Skin is warm and dry.  Neurological:      Mental Status: He is alert.  Psychiatric:        Mood and Affect: Mood and affect normal.        Behavior: Behavior is cooperative.        Thought Content: Thought content normal.        Cognition and Memory: Cognition normal.      Results for orders placed or performed in visit on 08/13/23  Glucose, capillary  Result Value Ref Range   Glucose-Capillary 169 (H) 70 - 99 mg/dL  POC Hbg W0J  Result Value Ref Range   Hemoglobin A1C 7.3 (A) 4.0 - 5.6 %   HbA1c POC (<> result, manual entry)     HbA1c, POC (prediabetic range)     HbA1c, POC (controlled diabetic range)      Last metabolic panel Lab Results  Component Value Date   GLUCOSE 203 (H) 05/30/2023   NA 136 05/30/2023   K 4.8 05/30/2023   CL 102 05/30/2023   CO2 21 05/30/2023   BUN 23 05/30/2023   CREATININE 1.19 05/30/2023   EGFR 74 05/30/2023   CALCIUM 8.9 05/30/2023   PHOS 3.3 06/15/2022   PROT 7.7 01/21/2023   ALBUMIN 3.9 01/21/2023   BILITOT 0.7 01/21/2023   ALKPHOS 83 01/21/2023   AST 23 01/21/2023   ALT 26 01/21/2023   ANIONGAP 10 01/23/2023   Last hemoglobin  A1c Lab Results  Component Value Date   HGBA1C 7.3 (A) 08/13/2023      The ASCVD Risk score (Arnett DK, et al., 2019) failed to calculate for the following reasons:   Risk score cannot be calculated because patient has a medical history suggesting prior/existing ASCVD    Assessment & Plan:   Problem List Items Addressed This Visit       Cardiovascular and Mediastinum   HTN (hypertension) (Chronic)   Patient presents with a history of hypertension with a blood pressure today of 146/79 with a repeat of 133/79. Their hypertension is controlled on a regimen of amlodipine 5 mg and Coreg 25 mg BID.  Prior BMP in January, Scr was 1.19.   Plan: -Continue current regimen ZO:XWRUEAVWUJ 5 mg and Coreg 25 mg BID         Endocrine   Type II diabetes mellitus with neurological manifestations (HCC) - Primary   Patient presents with a history of T2DM  with a prior A1c of 7.4 in January.  Patient's A1c today is 7.3.  They are on a regimen of Lantus 15 units and jardiance 25mg .  Patient reports hypoglycemia episodes a few times a week, it appears he had 3 episodes within the past month. The majority of the hypoglycemia episodes were in the evening.  He does not always eat lunch or dinner. I suspect the hypoglycemia is due to missing meals. Per review of CGM, average blood glucose is 142 over the past month. He remains motivated for weight loss.  Plan: -Continue regimen of Lantus 15 units, this will be divided in 2 doses: 10 units every morning since he eats breakfast daily, and 5 units every night if he eats dinner, or if his BG is greater than 250. Will continue Jardiance 25 mg : patient verbally understood and agrees with changes to plan.  -A1c today -Urine ACR was not collected at this visit, will need ordered at follow up  -Free style libre 3 +  ordered -Referral to [facility] sent       Relevant Orders   POC Hbg A1C (Completed)   Microalbumin / Creatinine Urine Ratio   Referral to Nutrition and Diabetes Services    Return in about 3 months (around 11/12/2023) for DM, HTN.    Faith Rogue, DO

## 2023-08-14 ENCOUNTER — Other Ambulatory Visit (HOSPITAL_COMMUNITY): Payer: Self-pay

## 2023-08-14 ENCOUNTER — Telehealth: Payer: Self-pay | Admitting: Dietician

## 2023-08-14 DIAGNOSIS — E1149 Type 2 diabetes mellitus with other diabetic neurological complication: Secondary | ICD-10-CM

## 2023-08-14 MED ORDER — FREESTYLE LIBRE 3 PLUS SENSOR MISC: 3 refills | Status: DC

## 2023-08-14 NOTE — Progress Notes (Signed)
 Internal Medicine Clinic Attending  Case discussed with the resident at the time of the visit.  We reviewed the resident's history and exam and pertinent patient test results.  I agree with the assessment, diagnosis, and plan of care documented in the resident's note. Will split dose long acting with option to hold smaller PM dose to avoid further hypoglycemia when skipping dinner meals.

## 2023-08-14 NOTE — Assessment & Plan Note (Addendum)
 Patient presents with a history of T2DM with a prior A1c of 7.4 in January.  Patient's A1c today is 7.3.  They are on a regimen of Lantus 15 units and jardiance 25mg .  Patient reports hypoglycemia episodes a few times a week, it appears he had 3 episodes within the past month. The majority of the hypoglycemia episodes were in the evening.  He does not always eat lunch or dinner. I suspect the hypoglycemia is due to missing meals. Per review of CGM, average blood glucose is 142 over the past month. He remains motivated for weight loss.  Plan: -Continue regimen of Lantus 15 units, this will be divided in 2 doses: 10 units every morning since he eats breakfast daily, and 5 units every night if he eats dinner, or if his BG is greater than 250. Will continue Jardiance 25 mg : patient verbally understood and agrees with changes to plan.  -A1c today -Urine ACR was not collected at this visit, will need ordered at follow up  -Free style libre 3 +  ordered -Referral to [facility] sent

## 2023-08-14 NOTE — Telephone Encounter (Signed)
 Given CGM printout by lab. After reviewing it called patient.   Patient agreed to a referral for [facility]. He also says "I don't use it (lantus) every day, most of time I don't take it. (Says somedays he is not hungry. Works at [employer] part time, says getting food is not a problem) Injects in the back of arm and abdomen. Not discarding after 28 days. Advised to only use abdomen for now and rotate injection site to help with more consistent blood sugars and prevent hypoglycemia.   He agrees a change in his insulin to [place] pen at next visit that lasts 56 days might be helpful and requests change CGM  to freestyle libre 3 plus for 3 month supply.

## 2023-08-14 NOTE — Assessment & Plan Note (Signed)
 Patient presents with a history of hypertension with a blood pressure today of 146/79 with a repeat of 133/79. Their hypertension is controlled on a regimen of amlodipine 5 mg and Coreg 25 mg BID.  Prior BMP in January, Scr was 1.19.   Plan: -Continue current regimen ZO:XWRUEAVWUJ 5 mg and Coreg 25 mg BID

## 2023-08-22 ENCOUNTER — Encounter: Payer: Self-pay | Admitting: Dietician

## 2023-09-02 IMAGING — MR MR HEAD WO/W CM
14 of 16 series · 42 of 48 positions shown · IV contrast (gadavist)
Comparison: CT head last night.

CLINICAL DATA: 50-year-old male with hypertension, CHF. Left facial
droop and numbness with difficulty speaking. Code stroke
presentation.

EXAM:
MRI HEAD WITHOUT AND WITH CONTRAST
TECHNIQUE: Multiplanar, multiecho pulse sequences of the brain and surrounding
structures were obtained without and with intravenous contrast.
CONTRAST:  10mL GADAVIST GADOBUTROL 1 MMOL/ML IV SOLN

[Series 5: DWI · axial · 3.0mm · 0.88mm/px · z∈[-132,+32]mm · 8 of 112 slices shown (1 of 4)]
[im 1/112]
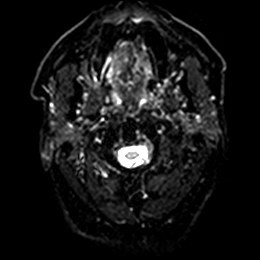
[im 16/112]
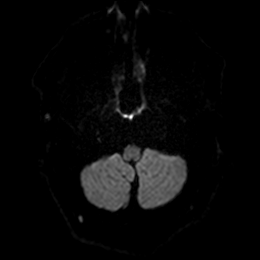
[im 32/112]
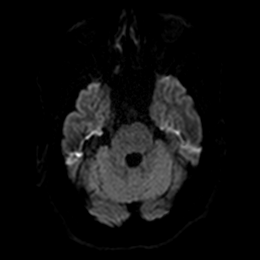
[im 48/112]
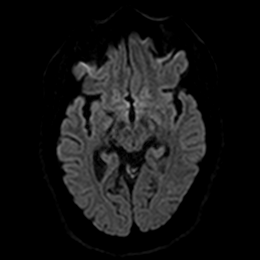
[im 64/112]
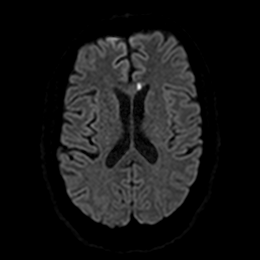
[im 80/112]
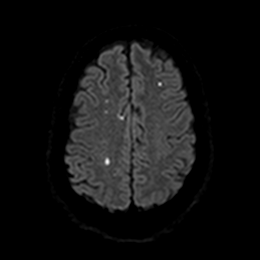
[im 96/112]
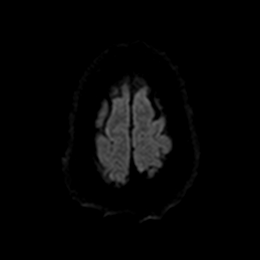
[im 112/112]
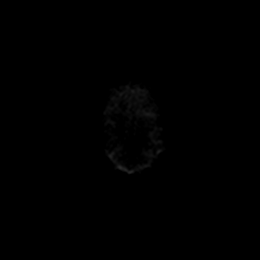

[Series 6: DWI · axial · 3.0mm · 0.88mm/px · z∈[-132,+32]mm · 4 of 56 slices shown (2 of 4)]
[im 1/56]
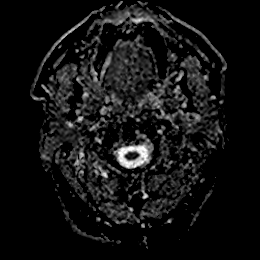
[im 19/56]
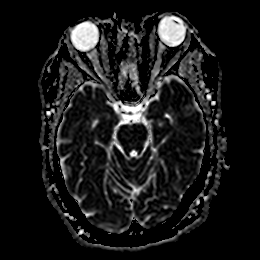
[im 37/56]
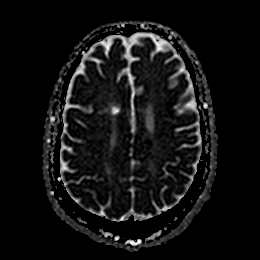
[im 56/56]
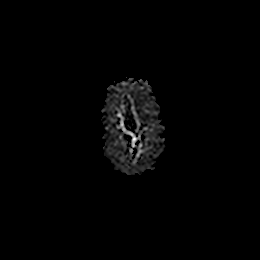

[Series 7: DWI · coronal · 4.0mm · 0.88mm/px · 6 of 78 slices shown (3 of 4)]
[im 1/78]
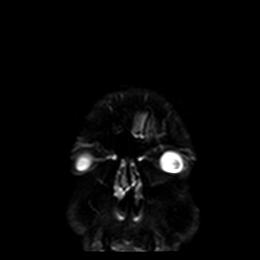
[im 16/78]
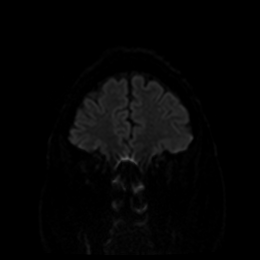
[im 31/78]
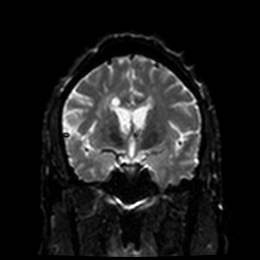
[im 47/78]
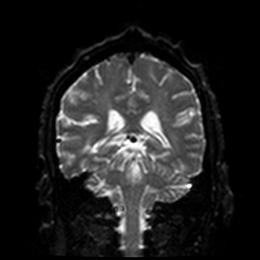
[im 62/78]
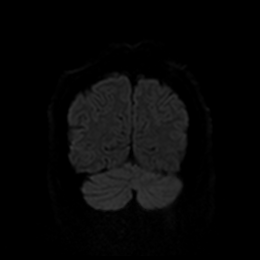
[im 78/78]
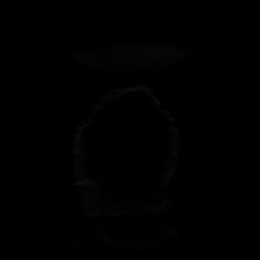

[Series 8: DWI · coronal · 4.0mm · 0.88mm/px · 2 of 39 slices shown (4 of 4)]
[im 1/39]
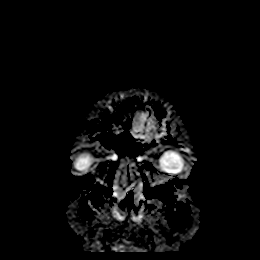
[im 39/39]
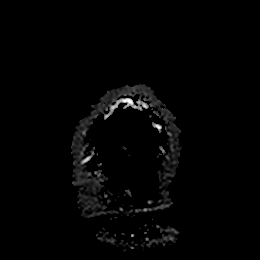

[Series 9: T1 · sagittal · 5.0mm · 0.75mm/px · 1 of 23 slices shown]
[im 1/23]
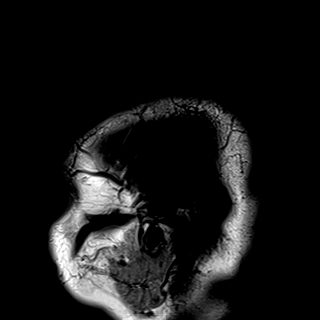

[Series 10: T2 · axial · 5.0mm · 0.72mm/px · z∈[-131,+31]mm · 2 of 28 slices shown (1 of 2)]
[im 1/28]
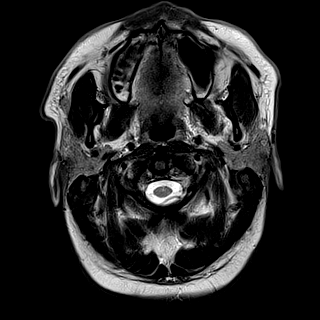
[im 28/28]
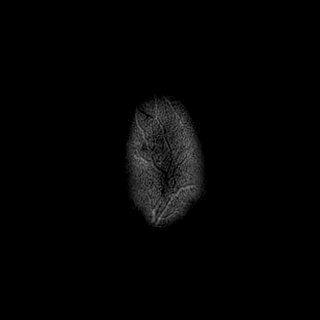

[Series 11: FLAIR · axial · 5.0mm · 0.45mm/px · z∈[-131,+30]mm · 2 of 28 slices shown]
[im 1/28]
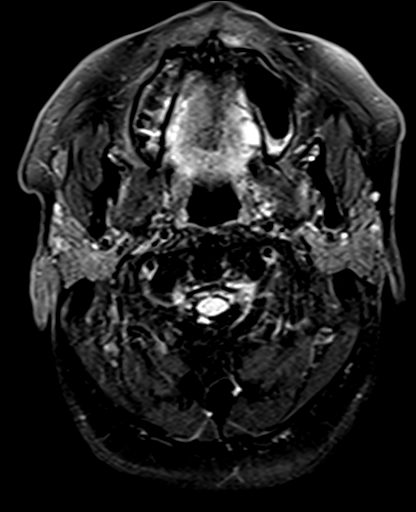
[im 28/28]
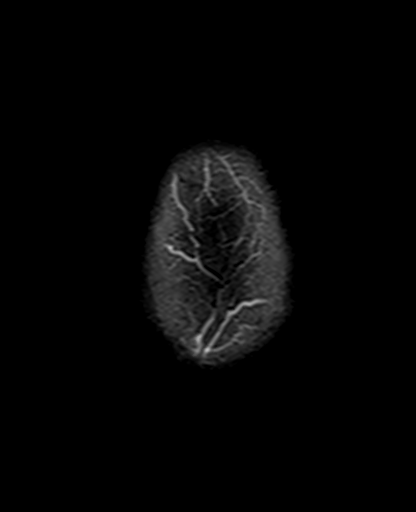

[Series 12: mag_images · axial · 3.0mm · 0.90mm/px · z∈[-133,+32]mm · 3 of 56 slices shown]
[im 1/56]
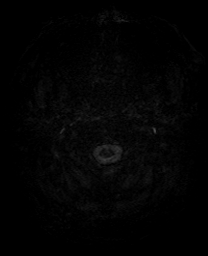
[im 28/56]
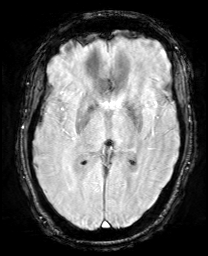
[im 56/56]
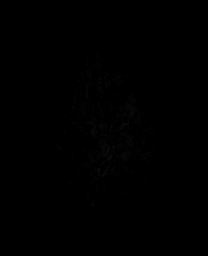

[Series 13: pha_images · axial · 3.0mm · 0.90mm/px · z∈[-133,+26]mm · 3 of 54 slices shown]
[im 1/54]
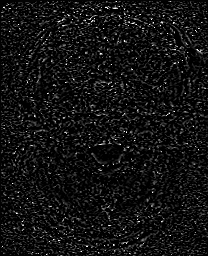
[im 27/54]
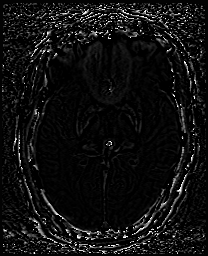
[im 54/54]
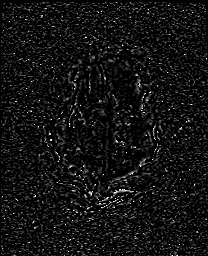

[Series 14: swi_images · axial · 3.0mm · 0.90mm/px · z∈[-133,+32]mm · 3 of 56 slices shown]
[im 1/56]
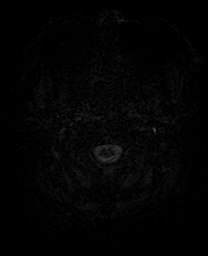
[im 28/56]
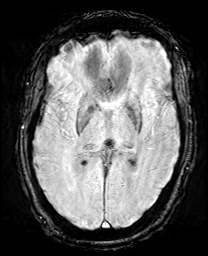
[im 56/56]
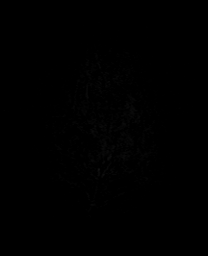

[Series 15: mip_images(sw) · axial · 24.0mm · 0.90mm/px · z∈[-122,+21]mm · 3 of 49 slices shown]
[im 1/49]
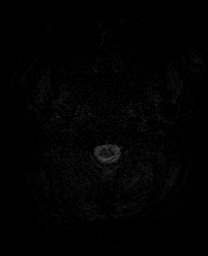
[im 25/49]
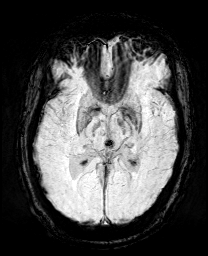
[im 49/49]
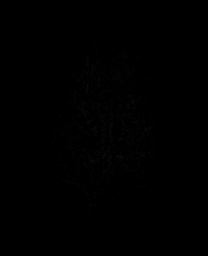

[Series 17: T2 · coronal · 5.0mm · 0.34mm/px · 2 of 33 slices shown (2 of 2)]
[im 1/33]
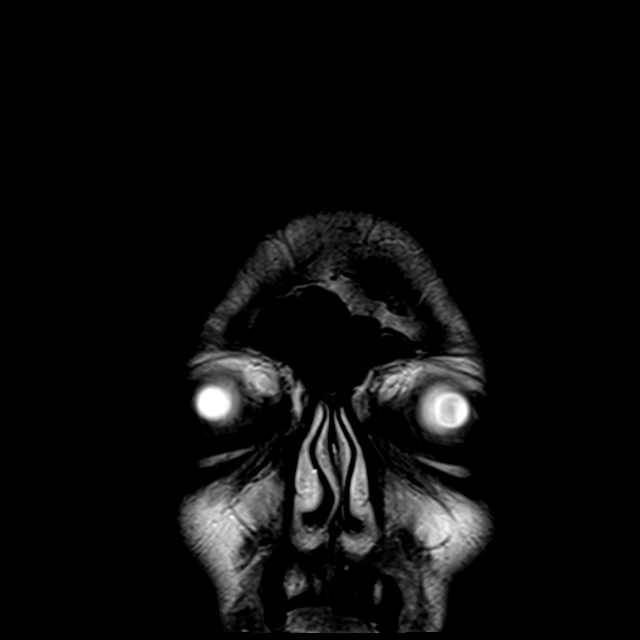
[im 33/33]
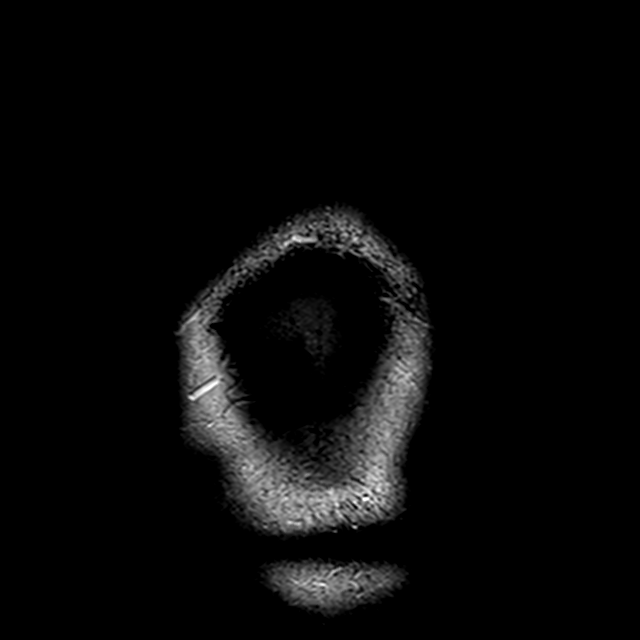

[Series 19: T1 post-contrast · coronal · 5.0mm · 0.34mm/px · 2 of 33 slices shown (1 of 2)]
[im 1/33]
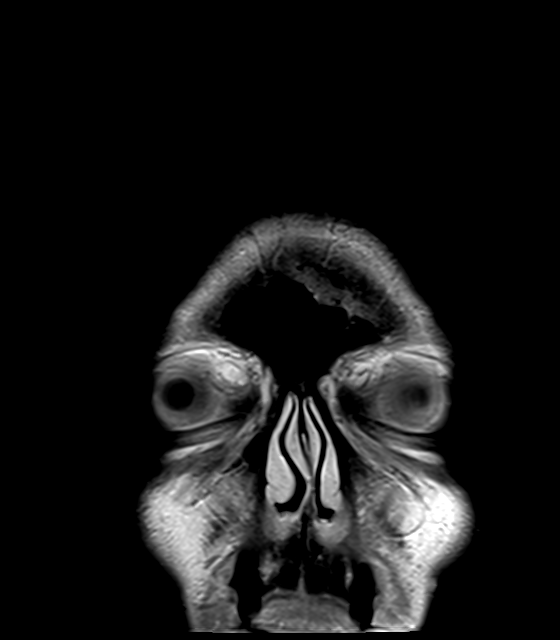
[im 33/33]
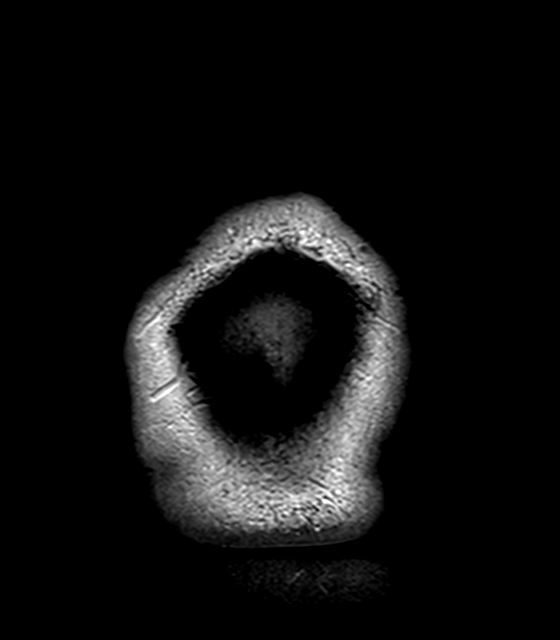

[Series 20: T1 post-contrast · sagittal · 5.0mm · 0.72mm/px · 1 of 23 slices shown (2 of 2)]
[im 1/23]
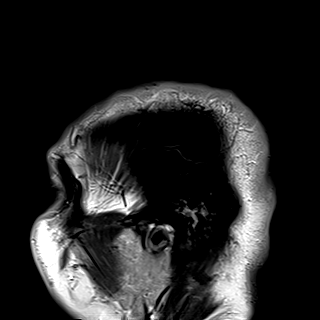

[42 of 48 positions shown; findings below may reference images not displayed]

FINDINGS: Brain: Approximately eight scattered small foci of restricted
diffusion in the bilateral cerebral hemispheres. Linear involvement
of the bilateral anterior corona radiata. Left genu corpus callosum
involvement on series 5, image 88.

Isolated subtle enhancement of the anterior left corona radiata
which appears to be the most subacute lesion.

No enhancement associated with the other lesions. And there are
underlying chronic lacunar infarcts of the bilateral cerebral white
matter, corpus callosum, and bilateral thalami.

Chronic microhemorrhage in the left corona radiata.

No chronic cortical encephalomalacia identified. No midline shift,
mass effect, evidence of mass lesion, ventriculomegaly, extra-axial
collection or acute intracranial hemorrhage. Cervicomedullary
junction and pituitary are within normal limits. No abnormal
enhancement identified. No dural thickening.

Vascular: The major dural venous sinuses are enhancing and appear to
be patent. Are preserved major intracranial vascular flow voids.

Skull and upper cervical spine: Negative visible cervical spine.
Visualized bone marrow signal is within normal limits.

Sinuses/Orbits: Postoperative changes to both globes with
Disconjugate gaze. Paranasal Visualized paranasal sinuses and
mastoids are stable and well aerated.

Other: Visible internal auditory structures appear normal. Negative
visible scalp and face.
IMPRESSION: 1. Approximately eight scattered small acute to subacute infarcts in
the both cerebral hemispheres.
Suspect a recent Embolic event. Query atrial fibrillation or other
risk factor. Synchronous small vessel disease (see #2) is less
likely. No associated hemorrhage or mass effect.

2. Underlying chronic small vessel ischemia with occasional chronic
blood products.

## 2023-09-02 IMAGING — CT CT ANGIO HEAD-NECK (W OR W/O PERF)
1 of 11 series · 5 of 33 positions shown · IV contrast (OMNI 350)
Comparison: Brain MRI 7097 hours today. Head CT yesterday.

CLINICAL DATA: 50-year-old male with multiple small infarcts
scattered in both cerebral hemispheres on MRI suspicious for embolic
etiology.

EXAM:
CT ANGIOGRAPHY HEAD AND NECK
TECHNIQUE: Multidetector CT imaging of the head and neck was performed using
the standard protocol during bolus administration of intravenous
contrast. Multiplanar CT image reconstructions and MIPs were
obtained to evaluate the vascular anatomy. Carotid stenosis
measurements (when applicable) are obtained utilizing NASCET
criteria, using the distal internal carotid diameter as the
denominator.

[Series 12: cta neck axial · axial · 0.56mm/px · z∈[-337,-95]mm · 5 of 364 slices shown]
[im 61/364  soft-tissue]
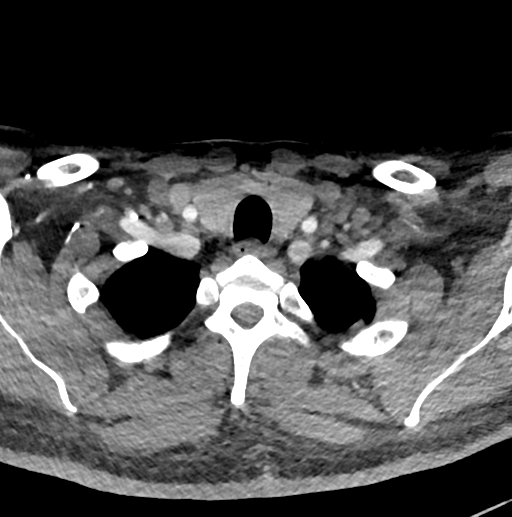
[im 122/364  bone]
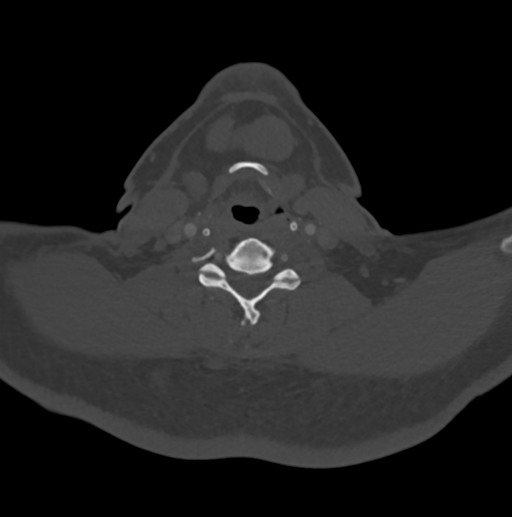
[im 182/364  soft-tissue]
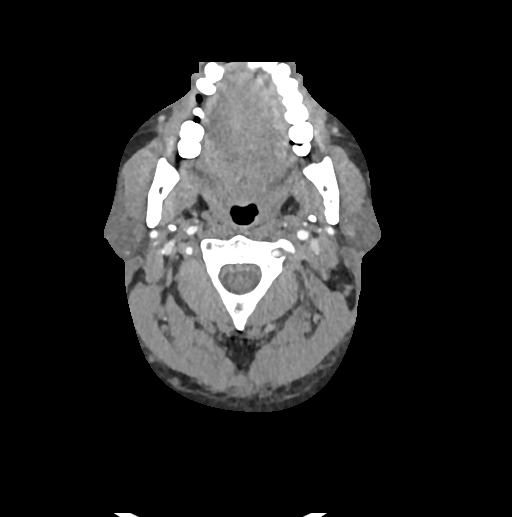
[im 243/364  bone]
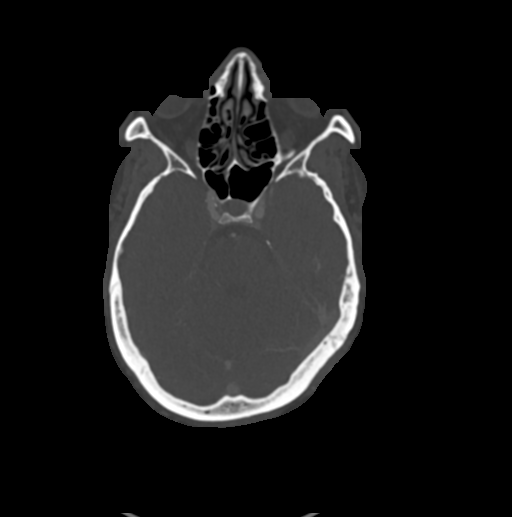
[im 303/364  soft-tissue]
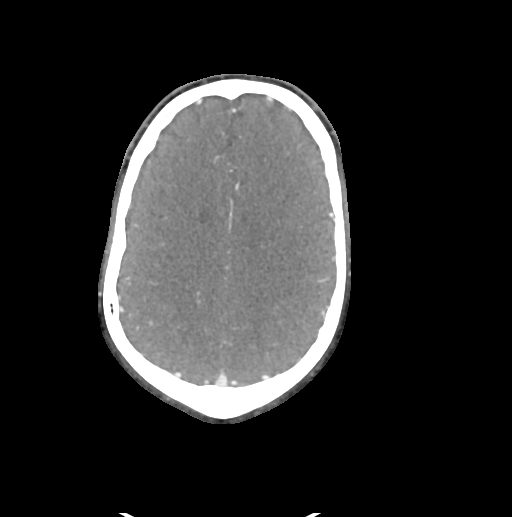

[5 of 33 positions shown; findings below may reference images not displayed]

RADIATION DOSE REDUCTION: This exam was performed according to the
departmental dose-optimization program which includes automated
exposure control, adjustment of the mA and/or kV according to
patient size and/or use of iterative reconstruction technique.

CONTRAST:  100mL OMNIPAQUE IOHEXOL 350 MG/ML SOLN
FINDINGS: CT HEAD

Brain: No midline shift, mass effect, or evidence of intracranial
mass lesion. No acute intracranial hemorrhage identified. No
ventriculomegaly.

The scattered small acute and subacute infarcts by MRI are largely
occult by CT. Underlying chronic white matter hypodensity most
pronounced in the right anterior corona radiata. Stable gray-white
matter differentiation throughout the brain.

Calvarium and skull base: No acute osseous abnormality identified.

Paranasal sinuses: Visualized paranasal sinuses and mastoids are
stable and well aerated.

Orbits: No acute orbit or scalp soft tissue finding.

CTA NECK

Skeleton: Carious dentition. Cervical spine degeneration including
some Ossification of the posterior longitudinal ligament (OPLL). (C4
series 10, image 113). No acute osseous abnormality identified.

Upper chest: Negative.

Other neck: Glottis is closed. No acute soft tissue finding
identified in the neck.

Aortic arch: 3 vessel arch configuration. No significant arch
atherosclerosis.

Right carotid system: Patent with no atherosclerosis or stenosis.

Left carotid system: Patent with minimal atherosclerosis of the left
CCA and at the posterior left ICA origin. No stenosis.

Vertebral arteries:
Negative.

CTA HEAD

Posterior circulation: Codominant distal vertebral arteries and
patent vertebrobasilar junction with mild irregularity but no
significant stenosis. More pronounced mild-to-moderate Basilar
artery irregularity (series 16 image 31) with up to mild mid basilar
stenosis. Patent SCA and PCA origins. Posterior communicating
arteries are diminutive or absent. Moderate bilateral long segment
P2 and P3 irregularity and stenosis, up to severe on the right side
(series 17, image 24). Distal PCA enhancement maintained.

Anterior circulation: Both ICA siphons are patent. On the left there
is mild calcified but up to moderate soft atherosclerotic plaque in
the supraclinoid segment. Only mild stenosis results. Similar right
siphon irregularity with mild to moderate supraclinoid stenosis.

Patent carotid termini, MCA and ACA origins. Mild irregularity and
stenosis at the left ACA origin. Normal anterior communicating
artery. Bilateral ACA branches are patent but with moderate to
severe 82 and 83 stenosis greater on the right (series 17, image
26). Left MCA M1 segment is patent but moderately irregular with
mild to moderate stenosis distal to the anterior temporal artery
which is irregular (series 16, image 27). Left MCA bifurcation
remains patent. Left MCA branches are mildly irregular. Right MCA M1
segment and bifurcation are patent without stenosis. Right MCA
branches are mildly irregular.

Venous sinuses: Patent.

Anatomic variants: None.

Review of the MIP images confirms the above findings
IMPRESSION: 1. Negative for large vessel occlusion.

2. Minimal extracranial but Severe Intracranial Atherosclerosis.
Multifocal circle-of-Willis branch irregularity and stenoses, most
pronounced:
- Moderate to Severe Right ACA A2 and A3 segments,
- Moderate to Severe Right PCA P2 and P3 segments,
- Moderate Left P2, distal Right ICA siphon,  Left MCA M1 segment.

3. Small acute and infarcts by MRI today are largely occult by CT.
No new intracranial abnormality.

4. Carious dentition. Cervical spine degeneration including some
ossification of the posterior longitudinal ligament (OPLL).

## 2023-10-07 DIAGNOSIS — H524 Presbyopia: Secondary | ICD-10-CM | POA: Diagnosis not present

## 2023-10-16 ENCOUNTER — Other Ambulatory Visit: Payer: Self-pay | Admitting: Student

## 2023-10-16 DIAGNOSIS — E1149 Type 2 diabetes mellitus with other diabetic neurological complication: Secondary | ICD-10-CM

## 2023-10-17 ENCOUNTER — Other Ambulatory Visit: Payer: Self-pay | Admitting: Internal Medicine

## 2023-10-17 DIAGNOSIS — I10 Essential (primary) hypertension: Secondary | ICD-10-CM

## 2023-10-17 DIAGNOSIS — I5020 Unspecified systolic (congestive) heart failure: Secondary | ICD-10-CM

## 2023-10-18 DIAGNOSIS — H268 Other specified cataract: Secondary | ICD-10-CM | POA: Diagnosis not present

## 2023-10-18 DIAGNOSIS — E113511 Type 2 diabetes mellitus with proliferative diabetic retinopathy with macular edema, right eye: Secondary | ICD-10-CM | POA: Diagnosis not present

## 2023-10-18 DIAGNOSIS — H538 Other visual disturbances: Secondary | ICD-10-CM | POA: Diagnosis not present

## 2023-10-18 DIAGNOSIS — H4311 Vitreous hemorrhage, right eye: Secondary | ICD-10-CM | POA: Diagnosis not present

## 2023-10-18 DIAGNOSIS — H5440 Blindness, one eye, unspecified eye: Secondary | ICD-10-CM | POA: Diagnosis not present

## 2023-10-18 DIAGNOSIS — Z961 Presence of intraocular lens: Secondary | ICD-10-CM | POA: Diagnosis not present

## 2023-10-18 DIAGNOSIS — Z888 Allergy status to other drugs, medicaments and biological substances status: Secondary | ICD-10-CM | POA: Diagnosis not present

## 2023-10-18 DIAGNOSIS — Z9841 Cataract extraction status, right eye: Secondary | ICD-10-CM | POA: Diagnosis not present

## 2023-10-22 DIAGNOSIS — H401110 Primary open-angle glaucoma, right eye, stage unspecified: Secondary | ICD-10-CM | POA: Diagnosis not present

## 2023-10-22 DIAGNOSIS — H538 Other visual disturbances: Secondary | ICD-10-CM | POA: Diagnosis not present

## 2023-10-22 DIAGNOSIS — E113511 Type 2 diabetes mellitus with proliferative diabetic retinopathy with macular edema, right eye: Secondary | ICD-10-CM | POA: Diagnosis not present

## 2023-10-22 DIAGNOSIS — Z794 Long term (current) use of insulin: Secondary | ICD-10-CM | POA: Diagnosis not present

## 2023-10-22 DIAGNOSIS — E1136 Type 2 diabetes mellitus with diabetic cataract: Secondary | ICD-10-CM | POA: Diagnosis not present

## 2023-10-29 ENCOUNTER — Encounter: Admitting: Dietician

## 2023-11-11 ENCOUNTER — Encounter: Admitting: Dietician

## 2023-11-12 ENCOUNTER — Ambulatory Visit: Admitting: Student

## 2023-11-12 ENCOUNTER — Ambulatory Visit: Admitting: Dietician

## 2023-11-12 VITALS — BP 144/81 | HR 67 | Temp 98.6°F | Ht 74.0 in | Wt 273.6 lb

## 2023-11-12 DIAGNOSIS — E1149 Type 2 diabetes mellitus with other diabetic neurological complication: Secondary | ICD-10-CM

## 2023-11-12 DIAGNOSIS — Z794 Long term (current) use of insulin: Secondary | ICD-10-CM | POA: Diagnosis not present

## 2023-11-12 DIAGNOSIS — I10 Essential (primary) hypertension: Secondary | ICD-10-CM

## 2023-11-12 DIAGNOSIS — Z7984 Long term (current) use of oral hypoglycemic drugs: Secondary | ICD-10-CM

## 2023-11-12 LAB — POCT GLYCOSYLATED HEMOGLOBIN (HGB A1C): Hemoglobin A1C: 8 % — AB (ref 4.0–5.6)

## 2023-11-12 LAB — GLUCOSE, CAPILLARY: Glucose-Capillary: 161 mg/dL — ABNORMAL HIGH (ref 70–99)

## 2023-11-12 MED ORDER — LANTUS SOLOSTAR 100 UNIT/ML ~~LOC~~ SOPN: 10.0000 [IU] | PEN_INJECTOR | Freq: Every day | SUBCUTANEOUS | 3 refills | Status: DC

## 2023-11-12 MED ORDER — SPIRONOLACTONE 25 MG PO TABS: 25.0000 mg | ORAL_TABLET | Freq: Every day | ORAL | 3 refills | Status: DC

## 2023-11-12 NOTE — Progress Notes (Signed)
 Internal Medicine Clinic Attending  Case discussed with the resident at the time of the visit.  We reviewed the resident's history and exam and pertinent patient test results.  I agree with the assessment, diagnosis, and plan of care documented in the resident's note.

## 2023-11-12 NOTE — Addendum Note (Signed)
 Addended by: ANTONE DWAYNE SAILOR on: 11/12/2023 11:39 AM   Modules accepted: Orders

## 2023-11-12 NOTE — Assessment & Plan Note (Signed)
 Vitals:   11/12/23 0915 11/12/23 0920 11/12/23 1000  BP: (!) 191/105 (!) 159/88 (!) 144/81     Blood pressure elevated x3 today in the clinic as stated above. He does have HFrEF, and is on GDMT with Coreg  25mg  BID, Entresto  97-103 BID, and for blood pressure is on amlodipine  5mg . His spironolactone  was stopped after a rise in creatinine after his last hospitalization, and was never restarted. His last BMP that I can see is in January which shows a creatinine of 1.19, around his baseline. Will restart his spironolactone  for GDMT management and blood pressure as well. Rechecking his BMP at next visit in one month (order is in for future BMP)  Plan:  - Continue his current regimen, with the addition of spironolactone  25mg 

## 2023-11-12 NOTE — Patient Instructions (Addendum)
 Thank you so much for coming to the clinic today!   Please only take insulin  10 units daily  For your blood pressure, we're going to be adding spironolactone  to your regimen as well We will see you back in a month for labs and a meeting with Arland.   If you have any questions please feel free to the call the clinic at anytime at [phone]. It was a pleasure seeing you!  Best, Dr. Anivea Velasques

## 2023-11-12 NOTE — Assessment & Plan Note (Addendum)
 Pt presents to discuss his diabetes. He has not been using his insulin  for the last 2 weeks as he had run out. He has only been taking his Jardiance . I was able to review his CGM data for the last two week (when he wasn't taking any insulin ), and he appears to be 71% in target range, 24% in the higher range. Average glucose is around 147 over that period of time. Interestingly enough, his A1c has increased to 8.0 from 7.0.  He is very motivated to lose weight and control diabetes without medications at some point in the future. He has joined a gym, and has been working on his diet. He was previously taking Lantus  10U in the AM and 5U at night if he ate dinner. He hasn't been taking his insulin  though for the last two weeks, and his glucose has been in range 70% of the time. He is not interested in ozempic  due to warnings of worsening vision on it. May be able to consider a different GLP-1 at some point. For now, will resume his Lantus  10U daily only, given precautions and warning signs for low blood sugar as well.   Plan:  - Continue Lantus  10U - Urine microalbumin today

## 2023-11-12 NOTE — Progress Notes (Addendum)
  Medical Nutrition Therapy:  Appt start time: 1000 end time:  1020. Total time: 20 minutes Visit # 1   Assessment:  Primary concerns today: would like weight loss.   Mr/ Gigante asks for information about how to decrease his weight. He says he wants to begin riding a  bike again and does a lot of walking at work. He eats a lot of brown rice and vegetables. He ran out of insulin  and has not taken in 2 weeks. His blood sugars are well controlled despite this showing average glucose 147, 4 hypoglycemic events and 72% in target range. He does not want to be on semaglutide  due to fear of worsening retinopathy.  Preferred Learning Style: No preference indicated  Learning Readiness: Ready  ANTHROPOMETRICS:  Estimated body mass index is 35.13 kg/m as calculated from the following:   Height as of an earlier encounter on 11/12/23: 6' 2 (1.88 m).   Weight as of an earlier encounter on 11/12/23: 273 lb 9.6 oz (124.1 kg).  WEIGHT HISTORY:  Wt Readings from Last 10 Encounters:  11/12/23 273 lb 9.6 oz (124.1 kg)  08/13/23 261 lb 1.6 oz (118.4 kg)  05/30/23 262 lb 6.4 oz (119 kg)  02/07/23 257 lb 8 oz (116.8 kg)  01/22/23 269 lb 1.6 oz (122.1 kg)  01/14/23 263 lb (119.3 kg)  01/01/23 258 lb 12.8 oz (117.4 kg)  12/05/22 251 lb 5.2 oz (114 kg)  11/21/22 253 lb (114.8 kg)  11/13/22 253 lb (114.8 kg)    SLEEP:need to assess at follow up  MEDICATIONS: lantus  to start back on 10 units a day BLOOD SUGAR: Lab Results  Component Value Date   HGBA1C 8.0 (A) 11/12/2023   HGBA1C 7.3 (A) 08/13/2023   HGBA1C 7.4 (A) 05/30/2023   HGBA1C 8.1 (A) 01/14/2023   HGBA1C 8.5 (A) 08/29/2022     DIETARY INTAKE: Did not assess in detail due to short visit time  Usual physical activity: walks at work   Progress Towards Goal(s):  In progress.   Nutritional Diagnosis:  NB-1.1 Food and nutrition-related knowledge deficit As related to lack of sufficient diabetes education.  As evidenced by his report and  questions.    Intervention:  Nutrition education about diabetes medications and their affect on weight, discussed diabetes and weight loss, meal planning and activity briefly today Action Goal: decrease portions of starch, eat enough protein( weight loss requires more), consider tracking steps  Outcome goal: weight loss Coordination of care:  consider trail of semaglutide   Teaching Method Utilized: Visual, Auditory,Hands on Handouts given during visit include: meal planning handout Barriers to learning/adherence to lifestyle change: competing values Demonstrated degree of understanding via:  Teach Back   Monitoring/Evaluation:  Dietary intake, exercise, meter/cgm, and body weight in 4 week(s). Arland Hole, RD 11/12/2023 2:52 PM.

## 2023-11-12 NOTE — Progress Notes (Signed)
 CC: T2DM follow up  HPI:  [name] is a 53 y.o. male living with a history stated below and presents today for a follow up regarding his type two diabetes. Please see problem based assessment and plan for additional details.  Past Medical History:  Diagnosis Date   Cataract    CHF (congestive heart failure) (HCC)    Diabetes mellitus    Hypertension    Stroke [facility])     Current Outpatient Medications on File Prior to Visit  Medication Sig Dispense Refill   amLODipine  (NORVASC ) 5 MG tablet TAKE 1 TABLET EVERY DAY 90 tablet 3   aspirin  EC 81 MG EC tablet Take 1 tablet (81 mg total) by mouth daily. Swallow whole. 30 tablet 11   carvedilol  (COREG ) 25 MG tablet TAKE 1 TABLET TWICE DAILY 180 tablet 3   Continuous Glucose Sensor (FREESTYLE LIBRE 3 PLUS SENSOR) MISC CHANGE SENSOR EVERY 15 DAYS 2 each 3   dorzolamide -timolol  (COSOPT ) 2-0.5 % ophthalmic solution Place 2 drops into the right eye 2 (two) times daily.     ENTRESTO  97-103 MG TAKE 1 TABLET TWICE DAILY 180 tablet 3   ezetimibe  (ZETIA ) 10 MG tablet Take 1 tablet (10 mg total) by mouth daily. (Patient not taking: Reported on 01/22/2023) 30 tablet 11   glucose blood (FREESTYLE LITE) test strip Monitor blood sugar at home 100 each 5   Insulin  Pen Needle (PEN NEEDLES) 32G X 4 MM MISC 1 each by Does not apply route in the morning and at bedtime. 200 each 1   JARDIANCE  25 MG TABS tablet TAKE 1 TABLET EVERY DAY 90 tablet 3   Multiple Vitamin (MULTIVITAMIN WITH MINERALS) TABS tablet Take 1 tablet by mouth daily. 130 tablet 0   rosuvastatin  (CRESTOR ) 40 MG tablet Take 1 tablet (40 mg total) by mouth daily. 90 tablet 0   No current facility-administered medications on file prior to visit.    Family History  Problem Relation Age of Onset   Colon cancer Neg Hx    Esophageal cancer Neg Hx    Stomach cancer Neg Hx    Rectal cancer Neg Hx     Social History   Socioeconomic History   Marital status: Single    Spouse name: Not  on file   Number of children: Not on file   Years of education: Not on file   Highest education level: Not on file  Occupational History   Not on file  Tobacco Use   Smoking status: Never   Smokeless tobacco: Never  Vaping Use   Vaping status: Never Used  Substance and Sexual Activity   Alcohol use: No   Drug use: No   Sexual activity: Not Currently    Partners: Female    Birth control/protection: None  Other Topics Concern   Not on file  Social History Narrative   Not on file   Social Drivers of Health   Financial Resource Strain: Low Risk  (07/12/2022)   Overall Financial Resource Strain (CARDIA)    Difficulty of Paying Living Expenses: Not hard at all  Food Insecurity: No Food Insecurity (08/14/2023)   Hunger Vital Sign    Worried About Running Out of Food in the Last Year: Never true    Ran Out of Food in the Last Year: Never true  Transportation Needs: No Transportation Needs (01/28/2023)   PRAPARE - Administrator, Civil Service (Medical): No    Lack of Transportation (Non-Medical): No  Physical Activity:  Sufficiently Active (07/12/2022)   Exercise Vital Sign    Days of Exercise per Week: 5 days    Minutes of Exercise per Session: 40 min  Stress: No Stress Concern Present (07/12/2022)   [employer] of Occupational Health - Occupational Stress Questionnaire    Feeling of Stress : Not at all  Social Connections: Socially Isolated (01/14/2023)   Social Connection and Isolation Panel    Frequency of Communication with Friends and Family: Twice a week    Frequency of Social Gatherings with Friends and Family: Never    Attends Religious Services: Never    Database administrator or Organizations: No    Attends Banker Meetings: Never    Marital Status: Divorced  Catering manager Violence: Not At Risk (01/22/2023)   Humiliation, Afraid, Rape, and Kick questionnaire    Fear of Current or Ex-Partner: No    Emotionally Abused: No    Physically  Abused: No    Sexually Abused: No    Review of Systems: ROS negative except for what is noted on the assessment and plan.  Vitals:   11/12/23 0915 11/12/23 0920 11/12/23 1000  BP: (!) 191/105 (!) 159/88 (!) 144/81  Pulse: 69 69 67  Temp: 98.6 F (37 C)    TempSrc: Oral    SpO2: 96%    Weight: 273 lb 9.6 oz (124.1 kg)    Height: 6' 2 (1.88 m)      Physical Exam: Constitutional: well-appearing male  in no acute distress Cardiovascular: regular rate and rhythm, no m/r/g Pulmonary/Chest: normal work of breathing on room air, lungs clear to auscultation bilaterally   Assessment & Plan:   Type II diabetes mellitus with neurological manifestations (HCC) Pt presents to discuss his diabetes. He has not been using his insulin  for the last 2 weeks as he had run out. He has only been taking his Jardiance . I was able to review his CGM data for the last two week (when he wasn't taking any insulin ), and he appears to be 71% in target range, 24% in the higher range. Average glucose is around 147 over that period of time. Interestingly enough, his A1c has increased to 8.0 from 7.0.  He is very motivated to lose weight and control diabetes without medications at some point in the future. He has joined a gym, and has been working on his diet. He was previously taking Lantus  10U in the AM and 5U at night if he ate dinner. He hasn't been taking his insulin  though for the last two weeks, and his glucose has been in range 70% of the time. He is not interested in ozempic  due to warnings of worsening vision on it. May be able to consider a different GLP-1 at some point. For now, will resume his Lantus  10U daily only, given precautions and warning signs for low blood sugar as well.   Plan:  - Continue Lantus  10U - Urine microalbumin today  Essential hypertension Vitals:   11/12/23 0915 11/12/23 0920 11/12/23 1000  BP: (!) 191/105 (!) 159/88 (!) 144/81     Blood pressure elevated x3 today in the  clinic as stated above. He does have HFrEF, and is on GDMT with Coreg  25mg  BID, Entresto  97-103 BID, and for blood pressure is on amlodipine  5mg . His spironolactone  was stopped after a rise in creatinine after his last hospitalization, and was never restarted. His last BMP that I can see is in January which shows a creatinine of 1.19, around his  baseline. Will restart his spironolactone  for GDMT management and blood pressure as well. Rechecking his BMP at next visit in one month (order is in for future BMP)  Plan:  - Continue his current regimen, with the addition of spironolactone  25mg    Patient discussed with Dr. Machen  Jehan Ranganathan, M.D. [facility] Internal Medicine, PGY-3 Pager: [phone] Date 11/12/2023 Time 3:14 PM

## 2023-11-13 ENCOUNTER — Ambulatory Visit: Payer: Self-pay | Admitting: Student

## 2023-11-13 LAB — MICROALBUMIN / CREATININE URINE RATIO
Creatinine, Urine: 25.9 mg/dL
Microalb/Creat Ratio: 1570 mg/g{creat} — ABNORMAL HIGH (ref 0–29)
Microalbumin, Urine: 406.7 ug/mL

## 2023-11-13 NOTE — Addendum Note (Signed)
 Addended by: Berish Bohman on: 11/13/2023 04:25 PM   Modules accepted: Orders

## 2023-11-13 NOTE — Progress Notes (Signed)
 With this degree of proteinuria despite treatment with maximum dose Entresto  and Jardiance , plus Spironolactone , I would recommend Nephrology referral

## 2023-11-15 ENCOUNTER — Other Ambulatory Visit: Payer: Self-pay | Admitting: Student

## 2023-11-15 DIAGNOSIS — E119 Type 2 diabetes mellitus without complications: Secondary | ICD-10-CM | POA: Diagnosis not present

## 2023-11-15 DIAGNOSIS — E1149 Type 2 diabetes mellitus with other diabetic neurological complication: Secondary | ICD-10-CM

## 2023-11-15 MED ORDER — FREESTYLE LIBRE 3 PLUS SENSOR MISC: 3 refills | Status: AC

## 2023-11-15 NOTE — Telephone Encounter (Signed)
 Copied from CRM [id number]. Topic: Clinical - Medication Refill >> Nov 15, 2023 10:15 AM Carrielelia G wrote: Medication: Continuous Glucose Sensor (FREESTYLE LIBRE 3 PLUS SENSOR) MISC  Has the patient contacted their pharmacy? Yes (Agent: If no, request that the patient contact the pharmacy for the refill. If patient does not wish to contact the pharmacy document the reason why and proceed with request.) (Agent: If yes, when and what did the pharmacy advise?)  This is the patient's preferred pharmacy:  [facility] [address], [place] - [address] [name] [address] Phone: [phone] Fax: [phone]  Is this the correct pharmacy for this prescription? Yes If no, delete pharmacy and type the correct one.   Has the prescription been filled recently? Yes  Is the patient out of the medication? Yes  Has the patient been seen for an appointment in the last year OR does the patient have an upcoming appointment? Yes  Can we respond through MyChart? Yes  Agent: Please be advised that Rx refills may take up to 3 business days. We ask that you follow-up with your pharmacy.

## 2023-11-19 DIAGNOSIS — H16429 Pannus (corneal), unspecified eye: Secondary | ICD-10-CM | POA: Diagnosis not present

## 2023-11-19 DIAGNOSIS — E113511 Type 2 diabetes mellitus with proliferative diabetic retinopathy with macular edema, right eye: Secondary | ICD-10-CM | POA: Diagnosis not present

## 2023-11-19 DIAGNOSIS — H401114 Primary open-angle glaucoma, right eye, indeterminate stage: Secondary | ICD-10-CM | POA: Diagnosis not present

## 2023-11-19 DIAGNOSIS — H269 Unspecified cataract: Secondary | ICD-10-CM | POA: Diagnosis not present

## 2023-12-05 ENCOUNTER — Other Ambulatory Visit: Payer: Self-pay

## 2023-12-05 DIAGNOSIS — N1831 Chronic kidney disease, stage 3a: Secondary | ICD-10-CM | POA: Diagnosis not present

## 2023-12-09 ENCOUNTER — Other Ambulatory Visit

## 2023-12-12 ENCOUNTER — Ambulatory Visit
Admission: RE | Admit: 2023-12-12 | Discharge: 2023-12-12 | Disposition: A | Discharge status: Home / Self Care | Source: Ambulatory Visit

## 2023-12-12 DIAGNOSIS — N1831 Chronic kidney disease, stage 3a: Secondary | ICD-10-CM | POA: Diagnosis not present

## 2023-12-24 DIAGNOSIS — E113511 Type 2 diabetes mellitus with proliferative diabetic retinopathy with macular edema, right eye: Secondary | ICD-10-CM | POA: Diagnosis not present

## 2023-12-24 DIAGNOSIS — Z024 Encounter for examination for driving license: Secondary | ICD-10-CM | POA: Diagnosis not present

## 2023-12-24 DIAGNOSIS — H401133 Primary open-angle glaucoma, bilateral, severe stage: Secondary | ICD-10-CM | POA: Diagnosis not present

## 2023-12-24 DIAGNOSIS — H268 Other specified cataract: Secondary | ICD-10-CM | POA: Diagnosis not present

## 2023-12-24 DIAGNOSIS — H5442A5 Blindness left eye category 5, normal vision right eye: Secondary | ICD-10-CM | POA: Diagnosis not present

## 2023-12-24 DIAGNOSIS — E113513 Type 2 diabetes mellitus with proliferative diabetic retinopathy with macular edema, bilateral: Secondary | ICD-10-CM | POA: Diagnosis not present

## 2024-01-02 ENCOUNTER — Other Ambulatory Visit: Payer: Self-pay | Admitting: Internal Medicine

## 2024-01-02 DIAGNOSIS — I10 Essential (primary) hypertension: Secondary | ICD-10-CM

## 2024-01-02 NOTE — Telephone Encounter (Signed)
 Medication sent to pharmacy

## 2024-01-08 DIAGNOSIS — E113513 Type 2 diabetes mellitus with proliferative diabetic retinopathy with macular edema, bilateral: Secondary | ICD-10-CM | POA: Diagnosis not present

## 2024-02-13 DIAGNOSIS — E113513 Type 2 diabetes mellitus with proliferative diabetic retinopathy with macular edema, bilateral: Secondary | ICD-10-CM | POA: Diagnosis not present

## 2024-02-26 ENCOUNTER — Other Ambulatory Visit: Payer: Self-pay | Admitting: Student

## 2024-02-26 DIAGNOSIS — E1149 Type 2 diabetes mellitus with other diabetic neurological complication: Secondary | ICD-10-CM

## 2024-03-06 ENCOUNTER — Other Ambulatory Visit: Payer: Self-pay | Admitting: Internal Medicine

## 2024-03-06 DIAGNOSIS — I10 Essential (primary) hypertension: Secondary | ICD-10-CM

## 2024-03-06 NOTE — Telephone Encounter (Signed)
 Medication sent to pharmacy

## 2024-03-23 DIAGNOSIS — N182 Chronic kidney disease, stage 2 (mild): Secondary | ICD-10-CM | POA: Diagnosis not present

## 2024-03-23 DIAGNOSIS — R809 Proteinuria, unspecified: Secondary | ICD-10-CM | POA: Diagnosis not present

## 2024-03-23 DIAGNOSIS — E1122 Type 2 diabetes mellitus with diabetic chronic kidney disease: Secondary | ICD-10-CM | POA: Diagnosis not present

## 2024-03-23 DIAGNOSIS — I130 Hypertensive heart and chronic kidney disease with heart failure and stage 1 through stage 4 chronic kidney disease, or unspecified chronic kidney disease: Secondary | ICD-10-CM | POA: Diagnosis not present

## 2024-03-23 DIAGNOSIS — I5022 Chronic systolic (congestive) heart failure: Secondary | ICD-10-CM | POA: Diagnosis not present

## 2024-04-01 DIAGNOSIS — E113513 Type 2 diabetes mellitus with proliferative diabetic retinopathy with macular edema, bilateral: Secondary | ICD-10-CM | POA: Diagnosis not present

## 2024-04-15 ENCOUNTER — Ambulatory Visit: Payer: Self-pay | Admitting: Student

## 2024-04-20 ENCOUNTER — Ambulatory Visit: Payer: Self-pay | Admitting: Student

## 2024-04-20 ENCOUNTER — Encounter: Payer: Self-pay | Admitting: Student

## 2024-04-20 VITALS — BP 191/93 | HR 75 | Temp 98.2°F | Ht 74.0 in | Wt 282.8 lb

## 2024-04-20 DIAGNOSIS — Z7984 Long term (current) use of oral hypoglycemic drugs: Secondary | ICD-10-CM | POA: Diagnosis not present

## 2024-04-20 DIAGNOSIS — Z79899 Other long term (current) drug therapy: Secondary | ICD-10-CM

## 2024-04-20 DIAGNOSIS — R32 Unspecified urinary incontinence: Secondary | ICD-10-CM | POA: Insufficient documentation

## 2024-04-20 DIAGNOSIS — R339 Retention of urine, unspecified: Secondary | ICD-10-CM | POA: Diagnosis not present

## 2024-04-20 DIAGNOSIS — I10 Essential (primary) hypertension: Secondary | ICD-10-CM | POA: Diagnosis not present

## 2024-04-20 DIAGNOSIS — I1A0 Resistant hypertension: Secondary | ICD-10-CM

## 2024-04-20 MED ORDER — TAMSULOSIN HCL 0.4 MG PO CAPS: 0.4000 mg | ORAL_CAPSULE | Freq: Every day | ORAL | 3 refills | Status: AC

## 2024-04-20 NOTE — Assessment & Plan Note (Addendum)
 His blood pressure is very high today.  222/120.  He is without any symptoms.  He states that he has not yet taken his medicines today, which are Coreg , amlodipine , spironolactone , Jardiance , Entresto .  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 I asked him to take his medicines in office and blood pressure improved to 191/93.  We discussed the severity of his high blood pressure.  He is going go home and measure his blood pressures throughout the afternoon.  If they do not improve, I have asked him to go to the ER particularly if they remain near 200 systolic.  In either case, I have asked him to return to this clinic this week or next for blood pressure evaluation and asked that he takes his medicine before the appointment.  - Continue spironolactone  12.5 daily, Jardiance  25 daily, Entresto  97-103 twice daily, Coreg  25 twice daily, amlodipine  5 daily

## 2024-04-20 NOTE — Progress Notes (Signed)
 Internal Medicine Clinic Attending  Case discussed with the resident at the time of the visit.  We reviewed the residents history and exam and pertinent patient test results.  I agree with the assessment, diagnosis, and plan of care documented in the residents note.   17M presenting w/ CC urinary issues however found to have elevated BP 222/120 --> repeat BP 191/93. Reports he did not take his BP meds this morning. Asymptomatic otherwise and exam wnl. Recommended pt takes his BP meds immediately and rechecks his BP at home. If his BP remains significantly elevated, will increase his BP regimen. RTC 2-3 days for formal nurse BP check/correlate home BP cuff. ED precautions provided.

## 2024-04-20 NOTE — Assessment & Plan Note (Addendum)
 He presents today for evaluation of 2 weeks of incontinence.  He notes that when he is sitting, he will feel a sudden urge to urinate and is often un able to hold it.  When this occurs, a small amount of urine leaks, not a full void.  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  He denies fevers, chills, dysuria, abdominal or suprapubic pain, new medicines, blood in the urine, or any other discharge.  This sounds like a mixed incontinence, with elements of overflow and urge.  BPH would be common but with a history of uncontrolled T2DM (better control now) I would consider a neuropathic component. In office bladder scan revealed volume of 375 prior to voiding and PVR of . I discussed a prostate exam in office but he declined, but I encouraged examination should this issue continue.  - I will check a UA - Trial Flomax  - Recommended timed voiding - If agreeable, consider DRE, PSA at next visit - Refer to urology.  He is requesting a circumcision, which is the primary reason I am referring him, but I feel it would be reasonable for him to discuss this issue with his urologist to if it is an ongoing issue.

## 2024-04-20 NOTE — Progress Notes (Signed)
 CC: Incontinence  HPI:  [name] is a 53 y.o. male with a PMH stated below who presents today for evaluation of incontinence.  Please see problem based assessment and plan for additional details.  Past Medical History:  Diagnosis Date   Cataract    CHF (congestive heart failure) (HCC)    Diabetes mellitus    Hypertension    Stroke [facility])     Review of Systems: ROS negative except for what is noted on the assessment and plan.  Vitals:   04/20/24 0942 04/20/24 1053  BP: (!) 222/120 (!) 191/93  Pulse: 77 75  Temp: 98.2 F (36.8 C)   TempSrc: Oral   SpO2: 98%   Weight: 282 lb 12.8 oz (128.3 kg)   Height: 6' 2 (1.88 m)     Physical Exam: Constitutional: well-appearing man in no acute distress HENT: normocephalic atraumatic, mucous membranes moist Eyes: conjunctiva non-erythematous Cardiovascular: regular rate and rhythm, no m/r/g Pulmonary/Chest: normal work of breathing on room air, lungs clear to auscultation bilaterally Abdominal: soft, non-tender, non-distended MSK: normal bulk and tone Neurological: alert & oriented x 3, no focal deficit GU: no suprapubic tenderness, GU and prostate exam deferred Skin: warm and dry Psych: normal mood and behavior  Assessment & Plan:   Patient discussed with Dr. Shawn  Incontinence He presents today for evaluation of 2 weeks of incontinence.  He notes that when he is sitting, he will feel a sudden urge to urinate and is often un able to hold it.  When this occurs, a small amount of urine leaks, not a full void.  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  He denies fevers, chills, dysuria, abdominal or suprapubic pain, new medicines, blood in the urine, or any other discharge.  This sounds like a mixed incontinence, with elements of overflow and urge.  BPH would be common but with a history of uncontrolled T2DM (better control now) I would consider  a neuropathic component. In office bladder scan revealed volume of 375 prior to voiding and PVR of . I discussed a prostate exam in office but he declined, but I encouraged examination should this issue continue.  - I will check a UA - Trial Flomax  - Recommended timed voiding - If agreeable, consider DRE, PSA at next visit - Refer to urology.  He is requesting a circumcision, which is the primary reason I am referring him, but I feel it would be reasonable for him to discuss this issue with his urologist to if it is an ongoing issue.  HTN (hypertension) His blood pressure is very high today.  222/120.  He is without any symptoms.  He states that he has not yet taken his medicines today, which are Coreg , amlodipine , spironolactone , Jardiance , Entresto .  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 I asked him to take his medicines in office and blood pressure improved to 191/93.  We discussed the severity of his high blood pressure.  He is going go home and measure his blood pressures throughout the afternoon.  If they do not improve, I have asked him to go to the ER particularly if they remain near 200 systolic.  In either case, I have asked him to return to this clinic this week or next for blood pressure evaluation and asked that he takes his medicine before the appointment.  - Continue spironolactone   12.5 daily, Jardiance  25 daily, Entresto  97-103 twice daily, Coreg  25 twice daily, amlodipine  5 daily  RTC in 1 week for blood pressure and incontinence follow up.  Lonni Africa, D.O. [facility] Internal Medicine, PGY-2 Phone: [phone] Date 04/20/2024 Time 1:20 PM

## 2024-04-20 NOTE — Patient Instructions (Signed)
 Mr Rockefeller,  For your urinary leakage, I will start flomax  (tamsulosin ) but I also recommend timing your urination throughout the day. Even if you do not have the urge, attempt to pee before you go for drives and about every 3 hours.  I will send you to a urologist regarding your desire for a circumcision. There, you can discuss your urinary symptoms too and see if any further testing is needed.  For your blood pressure, it is much too high right now. Please take your medicines as soon as possible. Measure your blood pressure this afternoon. If the top number remains above 200, I recommend you go to an ER for evaluation. Either way, please return for an appointment this week or next so we can check on you. Be sure you take your medicine before your appointment and every day.

## 2024-04-21 LAB — MICROSCOPIC EXAMINATION
Bacteria, UA: NONE SEEN
Casts: NONE SEEN /LPF
Epithelial Cells (non renal): NONE SEEN /HPF (ref 0–10)
RBC, Urine: NONE SEEN /HPF (ref 0–2)
WBC, UA: NONE SEEN /HPF (ref 0–5)

## 2024-04-21 LAB — URINALYSIS, ROUTINE W REFLEX MICROSCOPIC
Bilirubin, UA: NEGATIVE
Glucose, UA: NEGATIVE
Ketones, UA: NEGATIVE
Leukocytes,UA: NEGATIVE
Nitrite, UA: NEGATIVE
RBC, UA: NEGATIVE
Specific Gravity, UA: 1.011 (ref 1.005–1.030)
Urobilinogen, Ur: 0.2 mg/dL (ref 0.2–1.0)
pH, UA: 6.5 (ref 5.0–7.5)

## 2024-04-27 ENCOUNTER — Ambulatory Visit: Payer: Self-pay | Admitting: Student

## 2024-05-08 ENCOUNTER — Ambulatory Visit: Admitting: Student

## 2024-05-11 ENCOUNTER — Ambulatory Visit: Payer: Self-pay | Admitting: Student

## 2024-05-11 VITALS — BP 135/80 | HR 73 | Temp 99.0°F | Ht 74.0 in | Wt 274.2 lb

## 2024-05-11 DIAGNOSIS — Z794 Long term (current) use of insulin: Secondary | ICD-10-CM

## 2024-05-11 DIAGNOSIS — R32 Unspecified urinary incontinence: Secondary | ICD-10-CM | POA: Diagnosis not present

## 2024-05-11 DIAGNOSIS — Z23 Encounter for immunization: Secondary | ICD-10-CM | POA: Diagnosis not present

## 2024-05-11 DIAGNOSIS — E1149 Type 2 diabetes mellitus with other diabetic neurological complication: Secondary | ICD-10-CM | POA: Diagnosis not present

## 2024-05-11 DIAGNOSIS — I1A0 Resistant hypertension: Secondary | ICD-10-CM

## 2024-05-11 LAB — POCT GLYCOSYLATED HEMOGLOBIN (HGB A1C): HbA1c, POC (controlled diabetic range): 8.3 % — AB (ref 0.0–7.0)

## 2024-05-11 LAB — GLUCOSE, CAPILLARY: Glucose-Capillary: 236 mg/dL — ABNORMAL HIGH (ref 70–99)

## 2024-05-11 NOTE — Progress Notes (Signed)
 "  CC: Routine Follow Up for management of chronic medical conditions after last office visit 04/20/2024  HPI:  Steven Cochran is a 54 y.o. male with pertinent PMH of hypertension, HFrEF, type 2 diabetes, hyperlipidemia who presents as above. Please see assessment and plan below for further details.  Medications: Current Outpatient Medications  Medication Instructions   amLODipine  (NORVASC ) 5 mg, Oral, Daily   aspirin  EC 81 mg, Oral, Daily, Swallow whole.   carvedilol  (COREG ) 25 mg, Oral, 2 times daily   Continuous Glucose Sensor (FREESTYLE LIBRE 3 PLUS SENSOR) MISC Change sensor every 15 days.   dorzolamide -timolol  (COSOPT ) 2-0.5 % ophthalmic solution 2 drops, Right Eye, 2 times daily   EMBECTA PEN NEEDLE NANO 2 GEN 32G X 4 MM MISC USE 1  IN THE MORNING AND AT BEDTIME   ENTRESTO  97-103 MG 1 tablet, Oral, 2 times daily   ezetimibe  (ZETIA ) 10 mg, Oral, Daily   glucose blood (FREESTYLE LITE) test strip Monitor blood sugar at home   Jardiance  25 mg, Oral, Daily   Lantus  SoloStar 10 Units, Subcutaneous, Daily   Multiple Vitamin (MULTIVITAMIN WITH MINERALS) TABS tablet 1 tablet, Oral, Daily   rosuvastatin  (CRESTOR ) 40 mg, Oral, Daily   spironolactone  (ALDACTONE ) 12.5 mg, Oral, Daily   tamsulosin  (FLOMAX ) 0.4 mg, Oral, Daily     Review of Systems:   Pertinent items noted in HPI and/or A&P.  Physical Exam:  Vitals:   05/11/24 1345 05/11/24 1413  BP: (!) 151/82 135/80  Pulse: 74 73  Temp: 99 F (37.2 C)   TempSrc: Oral   SpO2: 97%   Weight: 274 lb 3.2 oz (124.4 kg)   Height: 6' 2 (1.88 m)     Constitutional: Well-appearing adult male. In no acute distress. Cardio:Regular rate and rhythm. 2+ bilateral radial pulses. Pulm:Clear to auscultation bilaterally. Normal work of breathing on room air. FDX:Wzhjupcz for extremity edema. Skin:Warm and dry. Neuro:Alert and oriented x3. No focal deficit noted. Psych:Pleasant mood and affect.   Assessment & Plan:   Assessment &  Plan Type II diabetes mellitus with neurological manifestations (HCC) A1c today 8.3.  He is currently on insulin  glargine 10 units daily.  Continues with continuous glucose monitoring with most a.m. blood sugars in the 90s and as high as 150 and some rare blood sugars between 200-250 after eating.  Discussed GLP-1 therapy but he is worried with his existing diabetic retinopathy that it could worsen this so he would like to hold off today.  Literature review shows there is evidence of complications with diabetic retinopathy when starting a GLP-1 agonist due to the rapid decrease in A1c.  No changes made today but I would recommend that he talks to his eye doctor about starting this medicine as he would be a good candidate for GLP-1 therapy otherwise. - Continue Lantus  10 units daily Resistant hypertension Blood pressure today initially elevated at 151/82 and then improved to 135/80.  He followed up with nephrology about 6 weeks ago and his amlodipine  was increased from 5 mg daily to 10 mg daily.  We will not make any changes today and recommend that he continues to follow-up with nephrology.  BMP today with stable renal function and a GFR of 65 and stable mild hypocalcemia at 8.6. - Continue amlodipine  10 mg daily, spironolactone  12.5 mg daily, Jardiance  25 mg daily, Entresto  97-103 mg twice daily, and carvedilol  25 mg twice daily Urinary incontinence, unspecified type After starting Flomax  his symptoms have been much better controlled and he has  not had any more episodes of incontinence or significant urgency. - Continue Flomax  0.4 mg daily  Orders Placed This Encounter  Procedures   Flu vaccine trivalent PF, 6mos and older(Flulaval,Afluria,Fluarix,Fluzone)   Glucose, capillary   Basic metabolic panel with GFR   POC Hbg A1C     Return in about 3 months (around 08/09/2024).   Patient discussed with Dr. Mliss Foot  Fairy Pool, DO [facility] Internal [facility] Resident  PGY-3 Clinic Phone: [phone] Please contact the on call pager at [phone] for any urgent or emergent needs. "

## 2024-05-12 LAB — BASIC METABOLIC PANEL WITH GFR
BUN/Creatinine Ratio: 18 (ref 9–20)
BUN: 24 mg/dL (ref 6–24)
CO2: 19 mmol/L — ABNORMAL LOW (ref 20–29)
Calcium: 8.6 mg/dL — ABNORMAL LOW (ref 8.7–10.2)
Chloride: 105 mmol/L (ref 96–106)
Creatinine, Ser: 1.31 mg/dL — ABNORMAL HIGH (ref 0.76–1.27)
Glucose: 217 mg/dL — ABNORMAL HIGH (ref 70–99)
Potassium: 4.6 mmol/L (ref 3.5–5.2)
Sodium: 137 mmol/L (ref 134–144)
eGFR: 65 mL/min/1.73

## 2024-05-12 NOTE — Assessment & Plan Note (Addendum)
 Blood pressure today initially elevated at 151/82 and then improved to 135/80.  He followed up with nephrology about 6 weeks ago and his amlodipine  was increased from 5 mg daily to 10 mg daily.  We will not make any changes today and recommend that he continues to follow-up with nephrology.  BMP today with stable renal function and a GFR of 65 and stable mild hypocalcemia at 8.6. - Continue amlodipine  10 mg daily, spironolactone  12.5 mg daily, Jardiance  25 mg daily, Entresto  97-103 mg twice daily, and carvedilol  25 mg twice daily

## 2024-05-12 NOTE — Patient Instructions (Signed)
 Thank you, [name], for allowing us  to provide your care today. Today we discussed . . .  > Diabetes       - As we discussed I would like you to continue your current medications but please talk to your eye doctor about medicines like Ozempic  or Mounjaro and see if they think these medicines could be safe for you to start in relation to your existing diabetic retinopathy.  Please let me know if you have any questions.   I have ordered the following labs for you:   Lab Orders         Glucose, capillary         Basic metabolic panel with GFR         POC Hbg A1C       Follow up: 3 months    Remember:  Should you have any questions or concerns please call the internal medicine clinic at [phone].     Fairy Pool, DO [facility]

## 2024-05-12 NOTE — Assessment & Plan Note (Signed)
 A1c today 8.3.  He is currently on insulin  glargine 10 units daily.  Continues with continuous glucose monitoring with most a.m. blood sugars in the 90s and as high as 150 and some rare blood sugars between 200-250 after eating.  Discussed GLP-1 therapy but he is worried with his existing diabetic retinopathy that it could worsen this so he would like to hold off today.  Literature review shows there is evidence of complications with diabetic retinopathy when starting a GLP-1 agonist due to the rapid decrease in A1c.  No changes made today but I would recommend that he talks to his eye doctor about starting this medicine as he would be a good candidate for GLP-1 therapy otherwise. - Continue Lantus  10 units daily

## 2024-05-12 NOTE — Assessment & Plan Note (Addendum)
 After starting Flomax  his symptoms have been much better controlled and he has not had any more episodes of incontinence or significant urgency. - Continue Flomax  0.4 mg daily

## 2024-05-13 NOTE — Progress Notes (Signed)
 Internal Medicine Clinic Attending  Case discussed with the resident at the time of the visit.  We reviewed the resident's history and exam and pertinent patient test results.  I agree with the assessment, diagnosis, and plan of care documented in the resident's note.

## 2024-05-25 ENCOUNTER — Other Ambulatory Visit: Payer: Self-pay | Admitting: Student

## 2024-05-25 DIAGNOSIS — I10 Essential (primary) hypertension: Secondary | ICD-10-CM

## 2024-05-25 NOTE — Telephone Encounter (Signed)
 Medication sent to pharmacy

## 2024-06-02 ENCOUNTER — Other Ambulatory Visit: Payer: Self-pay | Admitting: Internal Medicine

## 2024-06-02 DIAGNOSIS — E1169 Type 2 diabetes mellitus with other specified complication: Secondary | ICD-10-CM

## 2024-06-02 NOTE — Telephone Encounter (Signed)
 Medication sent to pharmacy

## 2024-06-03 ENCOUNTER — Encounter: Payer: Self-pay | Admitting: Student
